# Patient Record
Sex: Male | Born: 1935 | Hispanic: No | Marital: Married | Smoking: Never smoker
Health system: Southern US, Community
[De-identification: ages and names within clinical notes are randomized; demographics above are authoritative.]

## PROBLEM LIST (undated history)

## (undated) DIAGNOSIS — R066 Hiccough: Secondary | ICD-10-CM

## (undated) DIAGNOSIS — K2210 Ulcer of esophagus without bleeding: Secondary | ICD-10-CM

## (undated) DIAGNOSIS — H269 Unspecified cataract: Secondary | ICD-10-CM

## (undated) DIAGNOSIS — K2970 Gastritis, unspecified, without bleeding: Secondary | ICD-10-CM

## (undated) DIAGNOSIS — I639 Cerebral infarction, unspecified: Secondary | ICD-10-CM

## (undated) DIAGNOSIS — K449 Diaphragmatic hernia without obstruction or gangrene: Secondary | ICD-10-CM

## (undated) DIAGNOSIS — K922 Gastrointestinal hemorrhage, unspecified: Secondary | ICD-10-CM

## (undated) DIAGNOSIS — K2100 Gastro-esophageal reflux disease with esophagitis, without bleeding: Secondary | ICD-10-CM

## (undated) DIAGNOSIS — E785 Hyperlipidemia, unspecified: Secondary | ICD-10-CM

## (undated) DIAGNOSIS — D649 Anemia, unspecified: Secondary | ICD-10-CM

## (undated) HISTORY — PX: UPPER GASTROINTESTINAL ENDOSCOPY: SHX188

## (undated) HISTORY — DX: Gastro-esophageal reflux disease with esophagitis, without bleeding: K21.00

## (undated) HISTORY — DX: Gastro-esophageal reflux disease with esophagitis: K21.0

## (undated) HISTORY — PX: COLONOSCOPY: SHX174

## (undated) HISTORY — DX: Ulcer of esophagus without bleeding: K22.10

## (undated) HISTORY — DX: Unspecified cataract: H26.9

## (undated) HISTORY — DX: Hyperlipidemia, unspecified: E78.5

## (undated) HISTORY — DX: Hiccough: R06.6

## (undated) HISTORY — DX: Gastrointestinal hemorrhage, unspecified: K92.2

## (undated) HISTORY — PX: ESOPHAGOGASTRODUODENOSCOPY: SHX1529

---

## 2006-03-01 ENCOUNTER — Ambulatory Visit: Payer: Self-pay | Admitting: Internal Medicine

## 2006-03-03 ENCOUNTER — Ambulatory Visit: Payer: Self-pay | Admitting: Internal Medicine

## 2006-03-07 ENCOUNTER — Ambulatory Visit: Payer: Self-pay | Admitting: *Deleted

## 2006-03-16 ENCOUNTER — Ambulatory Visit (HOSPITAL_COMMUNITY): Admission: RE | Admit: 2006-03-16 | Discharge: 2006-03-16 | Payer: Self-pay | Admitting: Internal Medicine

## 2006-03-29 ENCOUNTER — Ambulatory Visit: Payer: Self-pay | Admitting: Internal Medicine

## 2006-07-07 ENCOUNTER — Ambulatory Visit (HOSPITAL_COMMUNITY): Admission: RE | Admit: 2006-07-07 | Discharge: 2006-07-07 | Payer: Self-pay | Admitting: Internal Medicine

## 2006-07-07 ENCOUNTER — Ambulatory Visit: Payer: Self-pay | Admitting: Internal Medicine

## 2006-07-07 LAB — CONVERTED CEMR LAB
ALT: 15 units/L (ref 0–40)
Albumin: 3.7 g/dL (ref 3.5–5.2)
Alkaline Phosphatase: 89 units/L (ref 39–117)
Creatinine, Ser: 1.5 mg/dL (ref 0.4–1.5)
Ferritin: 7 ng/mL — ABNORMAL LOW (ref 22.0–322.0)
Folate: 8.9 ng/mL
HCT: 39.5 % (ref 39.0–52.0)
Hemoglobin: 12.9 g/dL — ABNORMAL LOW (ref 13.0–17.0)
Lymphocytes Relative: 19.6 % (ref 12.0–46.0)
MCV: 74.5 fL — ABNORMAL LOW (ref 78.0–100.0)
Monocytes Absolute: 0.4 10*3/uL (ref 0.2–0.7)
Potassium: 4.1 meq/L (ref 3.5–5.1)
RBC: 5.29 M/uL (ref 4.22–5.81)
Saturation Ratios: 11.6 % — ABNORMAL LOW (ref 20.0–50.0)
Sodium: 137 meq/L (ref 135–145)
TSH: 1.23 microintl units/mL (ref 0.35–5.50)
Vitamin B-12: 267 pg/mL (ref 211–911)
WBC: 6.4 10*3/uL (ref 4.5–10.5)

## 2006-07-20 ENCOUNTER — Ambulatory Visit: Payer: Self-pay | Admitting: Internal Medicine

## 2006-07-27 ENCOUNTER — Ambulatory Visit: Payer: Self-pay | Admitting: Internal Medicine

## 2006-08-12 ENCOUNTER — Ambulatory Visit: Payer: Self-pay | Admitting: Internal Medicine

## 2014-11-06 ENCOUNTER — Ambulatory Visit: Payer: Self-pay | Admitting: Physical Therapy

## 2016-10-25 ENCOUNTER — Encounter: Payer: Self-pay | Admitting: Internal Medicine

## 2016-11-26 ENCOUNTER — Encounter (HOSPITAL_COMMUNITY): Payer: Self-pay | Admitting: Nurse Practitioner

## 2016-11-26 ENCOUNTER — Emergency Department (HOSPITAL_COMMUNITY): Payer: Medicare Other

## 2016-11-26 ENCOUNTER — Emergency Department (HOSPITAL_COMMUNITY)
Admission: EM | Admit: 2016-11-26 | Discharge: 2016-11-26 | Disposition: A | Discharge status: Home / Self Care | Payer: Medicare Other | Attending: Emergency Medicine | Admitting: Emergency Medicine

## 2016-11-26 DIAGNOSIS — K2900 Acute gastritis without bleeding: Secondary | ICD-10-CM | POA: Insufficient documentation

## 2016-11-26 DIAGNOSIS — R066 Hiccough: Secondary | ICD-10-CM | POA: Insufficient documentation

## 2016-11-26 DIAGNOSIS — Z7902 Long term (current) use of antithrombotics/antiplatelets: Secondary | ICD-10-CM | POA: Diagnosis not present

## 2016-11-26 DIAGNOSIS — Z79899 Other long term (current) drug therapy: Secondary | ICD-10-CM | POA: Insufficient documentation

## 2016-11-26 DIAGNOSIS — R1110 Vomiting, unspecified: Secondary | ICD-10-CM | POA: Diagnosis present

## 2016-11-26 HISTORY — DX: Diaphragmatic hernia without obstruction or gangrene: K44.9

## 2016-11-26 HISTORY — DX: Gastritis, unspecified, without bleeding: K29.70

## 2016-11-26 HISTORY — DX: Anemia, unspecified: D64.9

## 2016-11-26 HISTORY — DX: Cerebral infarction, unspecified: I63.9

## 2016-11-26 LAB — URINALYSIS, ROUTINE W REFLEX MICROSCOPIC
BILIRUBIN URINE: NEGATIVE
GLUCOSE, UA: NEGATIVE mg/dL
HGB URINE DIPSTICK: NEGATIVE
Ketones, ur: NEGATIVE mg/dL
NITRITE: NEGATIVE
PROTEIN: NEGATIVE mg/dL
Specific Gravity, Urine: 1.017 (ref 1.005–1.030)
pH: 6 (ref 5.0–8.0)

## 2016-11-26 LAB — CBC WITH DIFFERENTIAL/PLATELET
BASOS ABS: 0 10*3/uL (ref 0.0–0.1)
Basophils Relative: 0 %
Eosinophils Absolute: 0.3 10*3/uL (ref 0.0–0.7)
Eosinophils Relative: 4 %
HEMATOCRIT: 38.6 % — AB (ref 39.0–52.0)
HEMOGLOBIN: 12.5 g/dL — AB (ref 13.0–17.0)
LYMPHS ABS: 1.9 10*3/uL (ref 0.7–4.0)
LYMPHS PCT: 28 %
MCH: 25.9 pg — AB (ref 26.0–34.0)
MCHC: 32.4 g/dL (ref 30.0–36.0)
MCV: 80.1 fL (ref 78.0–100.0)
Monocytes Absolute: 0.4 10*3/uL (ref 0.1–1.0)
Monocytes Relative: 5 %
NEUTROS ABS: 4.1 10*3/uL (ref 1.7–7.7)
NEUTROS PCT: 63 %
Platelets: 233 10*3/uL (ref 150–400)
RBC: 4.82 MIL/uL (ref 4.22–5.81)
RDW: 14.1 % (ref 11.5–15.5)
WBC: 6.7 10*3/uL (ref 4.0–10.5)

## 2016-11-26 LAB — COMPREHENSIVE METABOLIC PANEL
ALT: 10 U/L — ABNORMAL LOW (ref 17–63)
ANION GAP: 7 (ref 5–15)
AST: 15 U/L (ref 15–41)
Albumin: 3.7 g/dL (ref 3.5–5.0)
Alkaline Phosphatase: 79 U/L (ref 38–126)
BUN: 28 mg/dL — AB (ref 6–20)
CHLORIDE: 107 mmol/L (ref 101–111)
CO2: 23 mmol/L (ref 22–32)
Calcium: 8.3 mg/dL — ABNORMAL LOW (ref 8.9–10.3)
Creatinine, Ser: 1.38 mg/dL — ABNORMAL HIGH (ref 0.61–1.24)
GFR calc Af Amer: 55 mL/min — ABNORMAL LOW (ref >60)
GFR, EST NON AFRICAN AMERICAN: 47 mL/min — AB (ref >60)
Glucose, Bld: 108 mg/dL — ABNORMAL HIGH (ref 65–99)
POTASSIUM: 4 mmol/L (ref 3.5–5.1)
Sodium: 137 mmol/L (ref 135–145)
TOTAL PROTEIN: 7.1 g/dL (ref 6.5–8.1)
Total Bilirubin: 0.6 mg/dL (ref 0.3–1.2)

## 2016-11-26 LAB — I-STAT CHEM 8, ED
BUN: 31 mg/dL — AB (ref 6–20)
CALCIUM ION: 1.09 mmol/L — AB (ref 1.15–1.40)
CHLORIDE: 103 mmol/L (ref 101–111)
Creatinine, Ser: 1.5 mg/dL — ABNORMAL HIGH (ref 0.61–1.24)
GLUCOSE: 109 mg/dL — AB (ref 65–99)
HCT: 39 % (ref 39.0–52.0)
Hemoglobin: 13.3 g/dL (ref 13.0–17.0)
Potassium: 3.9 mmol/L (ref 3.5–5.1)
SODIUM: 138 mmol/L (ref 135–145)
TCO2: 23 mmol/L (ref 0–100)

## 2016-11-26 LAB — LIPASE, BLOOD: Lipase: 29 U/L (ref 11–51)

## 2016-11-26 MED ORDER — CHLORPROMAZINE HCL 25 MG PO TABS: 25.0000 mg | ORAL_TABLET | Freq: Three times a day (TID) | ORAL | 0 refills | Status: DC

## 2016-11-26 MED ORDER — IOPAMIDOL (ISOVUE-300) INJECTION 61%
100.0000 mL | Freq: Once | INTRAVENOUS | Status: AC | PRN
  Administered 2016-11-26: 80 mL via INTRAVENOUS

## 2016-11-26 MED ORDER — IOPAMIDOL (ISOVUE-300) INJECTION 61%
INTRAVENOUS | Status: AC
  Administered 2016-11-26: 16:00:00
  Filled 2016-11-26: qty 100

## 2016-11-26 MED ORDER — PANTOPRAZOLE SODIUM 40 MG IV SOLR
40.0000 mg | Freq: Once | INTRAVENOUS | Status: AC
  Administered 2016-11-26: 40 mg via INTRAVENOUS
  Filled 2016-11-26: qty 40

## 2016-11-26 MED ORDER — SODIUM CHLORIDE 0.9 % IV BOLUS (SEPSIS)
500.0000 mL | Freq: Once | INTRAVENOUS | Status: AC
  Administered 2016-11-26: 500 mL via INTRAVENOUS

## 2016-11-26 MED ORDER — ONDANSETRON 4 MG PO TBDP: ORAL_TABLET | ORAL | 0 refills | Status: DC

## 2016-11-26 MED ORDER — CHLORPROMAZINE HCL 25 MG PO TABS
25.0000 mg | ORAL_TABLET | Freq: Once | ORAL | Status: AC
  Administered 2016-11-26: 25 mg via ORAL
  Filled 2016-11-26: qty 1

## 2016-11-26 NOTE — ED Triage Notes (Signed)
Pt presents to [facility] via [facility] from home accompanied by his wife and son for concerns of nausea and food intolerance for past 6 days. He has grown increasingly weak. He has an appointment with GI on 6/6 but family felt he was unable to wait as his symptoms worsened and he has become increasingly confused. Patient has had constant hiccups, last BM yesterday, and emesis with food.

## 2016-11-26 NOTE — ED Provider Notes (Signed)
[facility] DEPT Provider Note   CSN: [id number] Arrival date & time: 11/26/16  1117     History   Chief Complaint Chief Complaint  Patient presents with  . Dehydration  . Nausea  . Weakness    HPI Logan [name] is a 82 y.o. male.  Pt presents via EMS with upper abdominal pain and vomiting. Family states that he's had a six-day history of nausea and vomiting and hasn't really been no acute anything down. He's having normal bowel movements. There is no blood in his stool or blood in his emesis. He denies any urinary symptoms. He has ongoing pain across his upper abdomen. He has chronic hiccups she's had for about the last 7 years and they feel that the hiccups have been really bothering him. He had an endoscopy in [place] which apparently showed ulcerations in his stomach. He is currently scheduled to see Dr. Carlean Purl with gastroenterology. He seen him once in the past and has an upcoming appointment on June 6.      Past Medical History:  Diagnosis Date  . Anemia   . Gastritis    proximal  . Hiatal hernia   . Stroke [facility])    r side deficits    There are no active problems to display for this patient.   Past Surgical History:  Procedure Laterality Date  . COLONOSCOPY    . ESOPHAGOGASTRODUODENOSCOPY         Home Medications    Prior to Admission medications   Medication Sig Start Date End Date Taking? Authorizing Provider  clopidogrel (PLAVIX) 75 MG tablet Take 75 mg by mouth daily.   Yes [provider]  metoCLOPramide (REGLAN) 10 MG tablet Take 10 mg by mouth 4 (four) times daily.   Yes [provider]  mirtazapine (REMERON) 15 MG tablet Take 15 mg by mouth at bedtime.   Yes [provider]  omeprazole (PRILOSEC) 40 MG capsule Take 40 mg by mouth daily.   Yes [provider]  sucralfate (CARAFATE) 1 g tablet Take 1 g by mouth 4 (four) times daily.   Yes [provider]  Vitamin D, Ergocalciferol, (DRISDOL) 50000 units  CAPS capsule Take 50,000 Units by mouth every 7 (seven) days.   Yes [provider]  chlorproMAZINE (THORAZINE) 25 MG tablet Take 1 tablet (25 mg total) by mouth 3 (three) times daily. 11/26/16   Malvin Johns, MD  ondansetron (ZOFRAN ODT) 4 MG disintegrating tablet 4mg  ODT q4 hours prn nausea/vomit 11/26/16   Malvin Johns, MD    Family History No family history on file.  Social History Social History  Substance Use Topics  . Smoking status: Not on file  . Smokeless tobacco: Not on file  . Alcohol use Not on file     Allergies   Patient has no known allergies.   Review of Systems Review of Systems  Constitutional: Negative for chills, diaphoresis, fatigue and fever.  HENT: Negative for congestion, rhinorrhea and sneezing.   Eyes: Negative.   Respiratory: Negative for cough, chest tightness and shortness of breath.   Cardiovascular: Negative for chest pain and leg swelling.  Gastrointestinal: Positive for abdominal pain, nausea and vomiting. Negative for blood in stool and diarrhea.  Genitourinary: Negative for difficulty urinating, flank pain, frequency and hematuria.  Musculoskeletal: Negative for arthralgias and back pain.  Skin: Negative for rash.  Neurological: Negative for dizziness, speech difficulty, weakness, numbness and headaches.     Physical Exam Updated Vital Signs BP (!) 133/95  Pulse 98   Temp 98.3 F (36.8 C) (Oral)   Resp 14   SpO2 95%   Physical Exam  Constitutional: He is oriented to person, place, and time. He appears well-developed and well-nourished.  HENT:  Head: Normocephalic and atraumatic.  Eyes: Pupils are equal, round, and reactive to light.  Neck: Normal range of motion. Neck supple.  Cardiovascular: Normal rate, regular rhythm and normal heart sounds.   Pulmonary/Chest: Effort normal and breath sounds normal. No respiratory distress. He has no wheezes. He has no rales. He exhibits no tenderness.  Abdominal: Soft. Bowel sounds  are normal. There is tenderness (tenderness across upper abd). There is no rebound and no guarding.  Musculoskeletal: Normal range of motion. He exhibits no edema.  Lymphadenopathy:    He has no cervical adenopathy.  Neurological: He is alert and oriented to person, place, and time.  Skin: Skin is warm and dry. No rash noted.  Psychiatric: He has a normal mood and affect.     ED Treatments / Results  Labs (all labs ordered are listed, but only abnormal results are displayed) Labs Reviewed  COMPREHENSIVE METABOLIC PANEL - Abnormal; Notable for the following:       Result Value   Glucose, Bld 108 (*)    BUN 28 (*)    Creatinine, Ser 1.38 (*)    Calcium 8.3 (*)    ALT 10 (*)    GFR calc non Af Amer 47 (*)    GFR calc Af Amer 55 (*)    All other components within normal limits  CBC WITH DIFFERENTIAL/PLATELET - Abnormal; Notable for the following:    Hemoglobin 12.5 (*)    HCT 38.6 (*)    MCH 25.9 (*)    All other components within normal limits  URINALYSIS, ROUTINE W REFLEX MICROSCOPIC - Abnormal; Notable for the following:    Leukocytes, UA TRACE (*)    Bacteria, UA RARE (*)    Squamous Epithelial / LPF 0-5 (*)    All other components within normal limits  I-STAT CHEM 8, ED - Abnormal; Notable for the following:    BUN 31 (*)    Creatinine, Ser 1.50 (*)    Glucose, Bld 109 (*)    Calcium, Ion 1.09 (*)    All other components within normal limits  LIPASE, BLOOD    EKG  EKG Interpretation None       Radiology Ct Abdomen Pelvis W Contrast  Result Date: 11/26/2016 CLINICAL DATA:  Upper abdominal pain with vomiting. EXAM: CT ABDOMEN AND PELVIS WITH CONTRAST TECHNIQUE: Multidetector CT imaging of the abdomen and pelvis was performed using the standard protocol following bolus administration of intravenous contrast. CONTRAST:  51mL ISOVUE-300 IOPAMIDOL COMPARISON:  03/07/2006 FINDINGS: Lower chest: Thickened appearance of the distal esophagus with appearance submucosal low  density. Prominent although not strictly enlarged periesophageal lymph nodes. Hepatobiliary: No focal liver abnormality.No evidence of biliary obstruction or stone. Pancreas: Unremarkable. Spleen: Unremarkable. Adrenals/Urinary Tract: Negative adrenals. No hydronephrosis or stone. Subcentimeter low-density foci in the bilateral renal cortex is likely cysts, although too small for accurate densitometry. No acute bladder finding. Apex is chronically deviated to the right where there is a treated inguinal hernia. Stomach/Bowel:  No obstruction. No appendicitis. Vascular/Lymphatic: No acute vascular abnormality. No mass or adenopathy. Reproductive: Incidental prostate calcifications. Small varicocele on the left at least. Other: No ascites or pneumoperitoneum.Status post right inguinal hernia repair. Musculoskeletal: Right hip arthroplasty. 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 IMPRESSION: 1. Distal esophageal thickening suggesting esophagitis. 2. No acute intra-abdominal finding. Electronically Signed   By: Monte Fantasia M.D.   On: 11/26/2016 15:56    Procedures Procedures (including critical care time)  Medications Ordered in ED Medications  sodium chloride 0.9 % bolus 500 mL (500 mLs Intravenous New Bag/Given 11/26/16 1557)  pantoprazole (PROTONIX) injection 40 mg (40 mg Intravenous Given 11/26/16 1259)  sodium chloride 0.9 % bolus 500 mL (0 mLs Intravenous Stopped 11/26/16 1359)  chlorproMAZINE (THORAZINE) tablet 25 mg (25 mg Oral Given 11/26/16 1259)  iopamidol (ISOVUE-300) 61 % injection (  Contrast Given 11/26/16 1557)  iopamidol (ISOVUE-300) 61 % injection 100 mL (80 mLs Intravenous Contrast Given 11/26/16 1526)     Initial Impression / Assessment and Plan / ED Course  I have reviewed the triage vital signs and the nursing notes.  Pertinent labs & imaging results that were available during my care of the patient were reviewed  by me and considered in my medical decision making (see chart for details).     Patient presents with upper abdominal pain associated with hiccups and reflux. His hiccups been going on for about 7 years. He's had ongoing reflux issues with a hiatal hernia as well. He's currently on Prilosec and Carafate. He has an upcoming appointment with Dr. Carlean Purl on June 6. CT scan today doesn't reveal any acute abnormalities. His labs are non-concerning. His have resolved with a dose of Thorazine. I spoke with the patient and his son and he does not appear to warrant admission. He was given IV fluids here. His creatinine is similar to his baseline value. He was discharged home in good condition. He is currently being given a PO trial and will be discharged assuming that he has no issues with this. He was encouraged to keep his appointment on June 6. Return precautions were given. He was given a prescription for Thorazine, 7 day course and Zofran.  Final Clinical Impressions(s) / ED Diagnoses   Final diagnoses:  Hiccups  Acute superficial gastritis without hemorrhage    New Prescriptions New Prescriptions   CHLORPROMAZINE (THORAZINE) 25 MG TABLET    Take 1 tablet (25 mg total) by mouth 3 (three) times daily.   ONDANSETRON (ZOFRAN ODT) 4 MG DISINTEGRATING TABLET    4mg  ODT q4 hours prn nausea/vomit     Malvin Johns, MD 11/26/16 1622

## 2016-11-26 NOTE — ED Notes (Signed)
Made first request for a urine sample

## 2016-11-26 NOTE — ED Notes (Signed)
Patient transported to CT 

## 2016-11-26 NOTE — ED Notes (Signed)
Bed: WA21 Expected date:  Expected time:  Means of arrival: Ambulance Comments: EMS dehydration

## 2016-12-01 ENCOUNTER — Encounter: Payer: Self-pay | Admitting: Internal Medicine

## 2016-12-01 ENCOUNTER — Ambulatory Visit (INDEPENDENT_AMBULATORY_CARE_PROVIDER_SITE_OTHER): Payer: Medicare Other | Admitting: Internal Medicine

## 2016-12-01 VITALS — BP 106/82 | HR 76 | Ht 66.0 in | Wt 143.0 lb

## 2016-12-01 DIAGNOSIS — Z7902 Long term (current) use of antithrombotics/antiplatelets: Secondary | ICD-10-CM

## 2016-12-01 DIAGNOSIS — R066 Hiccough: Secondary | ICD-10-CM | POA: Diagnosis not present

## 2016-12-01 DIAGNOSIS — D649 Anemia, unspecified: Secondary | ICD-10-CM

## 2016-12-01 DIAGNOSIS — R933 Abnormal findings on diagnostic imaging of other parts of digestive tract: Secondary | ICD-10-CM

## 2016-12-01 MED ORDER — CHLORPROMAZINE HCL 25 MG PO TABS: 25.0000 mg | ORAL_TABLET | Freq: Three times a day (TID) | ORAL | 1 refills | Status: DC | PRN

## 2016-12-01 NOTE — Progress Notes (Signed)
Logan [name] 82 y.o. Aug 01, 1934 [id number]  Assessment & Plan:   Encounter Diagnoses  Name Primary?  . Hiccoughs Yes  . Abnormal CT scan, esophagus   . Mild anemia   . Long term current use of antithrombotics/antiplatelets     The patient's hiccups are improved on Thorazine. He has been hit cup free for several days or a week so we can stop that and use those again as needed. I will refill thorazine  He has a thickened esophagus on CT scan. This is probably esophagitis or spasm or his small hiatal hernia. Given everything that has been going on there some mild weight loss she has a mild anemia I think an upper endoscopy is reasonable. We'll evaluate to make sure there is no significant structural abnormality. He does not seem to have dysphagia. He did have some transient oropharyngeal type dysphagia after his stroke but that apparently is gone.  The risks and benefits as well as alternatives of endoscopic procedure(s) have been discussed and reviewed. All questions answered. The patient agrees to proceed. Higher risk of bleeding on clopidogrel but would not stop given recent stroke.  We'll request records from the hospital he was at a [place]. It sounds like he was recommended to have a fundoplication or some sort of surgery repair is hiatal hernia itch. She cups but that would not be appropriate based on upon what I know. I am glad he did not have that done.  I appreciate the opportunity to care for you. Logan Mayer, MD, Logan [name]  Subjective:   Chief Complaint: Multiple, mainly hiccups and vomiting.  HPI Patient is here with his son and wife, we used an interpreter and also spoke to his other son in [place] during the interview. I had seen him remotely, and performed colonoscopy and upper endoscopy in 2007 and 2008. At that time he was complaining of recurrent vomiting and had been treated with domperidone and PPI without relief. Small hiatal hernia. Thickened gastric  folds. He had chronic active gastritis with H. pylori which was treated. Colonoscopy for iron deficiency anemia was negative. He was also tried on baclofen for the hiccups then. Moving forward to the present day he had a small stroke sometime the past few months when he was in [place] with his other son, he had mild dysphagia and dysarthria or aphasia deficits that improved significantly per the family. He spent a month and a rehabilitation center. He is now down here he had a lot of trouble with hiccups again went to the emergency room where Thorazine was prescribed and seemed to break the cycle. He has not had hiccups in about a week. Because they had a recommendation about surgery for the hiatal hernia to stop the hiccups they presented here. In the emergency room a CT scan was done which demonstrated a distal thickening of the esophagus. Images viewed. Otherwise negative. His labs showed a mild anemia which was similar to what he had in the past with a hemoglobin of 12.5 and a normal but slightly low MCV. This is on June 1. Creatinine slightly elevated at 1.5 and 1.38.  Mild weight loss is reported.  Patient also has reported some melena. And constipation issues.  No Known Allergies Current Meds  Medication Sig  . chlorproMAZINE (THORAZINE) 25 MG tablet Take 1 tablet (25 mg total) by mouth 3 (three) times daily as needed.  . clopidogrel (PLAVIX) 75 MG tablet Take 75 mg by mouth daily.  Logan [name]  metoCLOPramide (REGLAN) 10 MG tablet Take 10 mg by mouth 4 (four) times daily.  . mirtazapine (REMERON) 15 MG tablet Take 15 mg by mouth at bedtime.  Logan [name] omeprazole (PRILOSEC) 40 MG capsule Take 40 mg by mouth daily.  . ondansetron (ZOFRAN ODT) 4 MG disintegrating tablet 4mg  ODT q4 hours prn nausea/vomit  . sucralfate (CARAFATE) 1 g tablet Take 1 g by mouth 4 (four) times daily.  . Vitamin D, Ergocalciferol, (DRISDOL) 50000 units CAPS capsule Take 50,000 Units by mouth every 7 (seven) days.  . [DISCONTINUED]  chlorproMAZINE (THORAZINE) 25 MG tablet Take 1 tablet (25 mg total) by mouth 3 (three) times daily.   Past Medical History:  Diagnosis Date  . Anemia   . Gastritis    proximal  . Hiatal hernia   . Stroke [facility])    r side deficits   Past Surgical History:  Procedure Laterality Date  . COLONOSCOPY    . ESOPHAGOGASTRODUODENOSCOPY     Social History   Social History  . Marital status: Married    Spouse name: N/A  . Number of children: N/A  . Years of education: N/A   Occupational History  . retired    Social History Main Topics  . Smoking status: Never Smoker  . Smokeless tobacco: Never Used  . Alcohol use No  . Drug use: Unknown  . Sexual activity: Not Asked   Other Topics Concern  . None   Social History Narrative   Married 2 sons (at least)   Spends time with them in [place] and [place] Hx non-contributory - sons are healthy  Review of Systems He now uses  a walker. Slow down some. Frustrated and anxious over the hiccups problems. Allother review of systems negative or as per history of present illness.  Objective:   Physical Exam @BP  106/82   Pulse 76   Ht 5\' 6"  (1.676 m)   Wt 143 lb (64.9 kg)   BMI 23.08 kg/m @  General:  Well-developed, well-nourished and in no acute distress Eyes:  anicteric. ENT:    Rhinophyma changes Neck:   supple w/o thyromegaly or mass.  Lungs: Clear to auscultation bilaterally. Heart:  S1S2, no rubs, murmurs, gallops. Abdomen:  soft, non-tender, no hepatosplenomegaly, hernia, or mass and BS+.  Rectal: Brown heme neg stool Lymph:  no cervical or supraclavicular adenopathy. Extremities:   no edema, cyanosis or clubbing Skin   no rash. Neuro:  A&O x 3.  Psych:  appropriate mood and  Affect.   Data Reviewed:   CT scan, images, hospital notes from 6/1 ED visit and labs and prior remote GI records

## 2016-12-01 NOTE — Patient Instructions (Signed)
   You have been scheduled for an endoscopy. Please follow written instructions given to you at your visit today. If you use inhalers (even only as needed), please bring them with you on the day of your procedure.   Stop the thorazine and just use it as needed.    I appreciate the opportunity to care for you. Silvano Rusk, MD, [facility]

## 2016-12-02 ENCOUNTER — Encounter: Payer: Self-pay | Admitting: Internal Medicine

## 2016-12-02 ENCOUNTER — Ambulatory Visit (INDEPENDENT_AMBULATORY_CARE_PROVIDER_SITE_OTHER): Payer: Medicare Other | Admitting: Internal Medicine

## 2016-12-02 VITALS — BP 144/93 | HR 77 | Temp 97.3°F | Resp 19 | Ht 66.0 in | Wt 143.0 lb

## 2016-12-02 DIAGNOSIS — R933 Abnormal findings on diagnostic imaging of other parts of digestive tract: Secondary | ICD-10-CM | POA: Diagnosis present

## 2016-12-02 MED ORDER — SODIUM CHLORIDE 0.9 % IV SOLN
500.0000 mL | INTRAVENOUS | Status: DC
Start: 2016-12-02 — End: 2017-03-06

## 2016-12-02 NOTE — Progress Notes (Signed)
Called to room to assist during endoscopic procedure.  Patient ID and intended procedure confirmed with present staff. Received instructions for my participation in the procedure from the performing physician.  

## 2016-12-02 NOTE — Op Note (Signed)
[facility] Patient Name: Logan [name] Procedure Date: 12/02/2016 8:27 AM MRN: [id number] Endoscopist: [name] , MD Age: 82 Referring MD:  Date of Birth: Apr 18, 1935 Gender: Male Account #: [id number] Procedure:                Upper GI endoscopy Indications:              Abnormal CT of the GI tract, Recurrent hiccups Medicines:                Propofol per Anesthesia, Monitored Anesthesia Care Procedure:                Pre-Anesthesia Assessment:                           - Prior to the procedure, a History and Physical                            was performed, and patient medications and                            allergies were reviewed. The patient's tolerance of                            previous anesthesia was also reviewed. The risks                            and benefits of the procedure and the sedation                            options and risks were discussed with the patient.                            All questions were answered, and informed consent                            was obtained. Prior Anticoagulants: The patient                            last took Plavix (clopidogrel) on the day of the                            procedure. ASA Grade Assessment: II - A patient                            with mild systemic disease. After reviewing the                            risks and benefits, the patient was deemed in                            satisfactory condition to undergo the procedure.                           After obtaining informed consent, the endoscope was  passed under direct vision. Throughout the                            procedure, the patient's blood pressure, pulse, and                            oxygen saturations were monitored continuously. The                            Model GIF-[id number]) scope was introduced                            through the mouth, and advanced to the second part                  of duodenum. The upper GI endoscopy was                            accomplished without difficulty. The patient                            tolerated the procedure well. Scope In: Scope Out: Findings:                 LA Grade A (one or more mucosal breaks less than 5                            mm, not extending between tops of 2 mucosal folds)                            esophagitis with no bleeding was found in the                            distal esophagus.                           A 6 cm hiatal hernia was present.                           The exam was otherwise without abnormality.                           The cardia and gastric fundus were normal on                            retroflexion.                           Biopsies were taken with a cold forceps in the                            gastric antrum for Helicobacter pylori testing                            using CLOtest. Estimated blood loss was minimal. Complications:  No immediate complications. Estimated Blood Loss:     Estimated blood loss was minimal. Impression:               - LA Grade A esophagitis.                           - 6 cm hiatal hernia.                           - The examination was otherwise normal.                           - Biopsies were taken with a cold forceps for                            Helicobacter pylori testing using CLOtest. Recommendation:           - Patient has a contact number available for                            emergencies. The signs and symptoms of potential                            delayed complications were discussed with the                            patient. Return to normal activities tomorrow.                            Written discharge instructions were provided to the                            patient.                           - Resume previous diet.                           - Continue present medications.                           - Await  pathology results.                           - Use thorazine prn hiccups                           await CLO test - hx H. pylori - want to see if                            eradicated                           Do not recommend any surgery (seems like [place] MD was                            recommending hiatal hernia  repair for refractory                            hiccups) - await records review for clarification                           Will call results [name], MD 12/02/2016 8:44:38 AM This report has been signed electronically.

## 2016-12-02 NOTE — Progress Notes (Signed)
Interpreter used today at the [facility] for this pt.  Interpreter's name is- son Juanmanuel Marohl

## 2016-12-02 NOTE — Patient Instructions (Addendum)
There was a hiatal hernia like we know and slight inflammation where the esophagus and stomach meet. That was probably due to the hiccups and vomiting and no a cause.  I took biopsies from the stomach to test for infection - you had one years ago and I want to make sure it is gone.  I do not think you need any surgery.  I will call with results next week.  I hope the hiccups stay away.  I appreciate the opportunity to care for you. [name], MD, FACG   YOU HAD AN ENDOSCOPIC PROCEDURE TODAY AT [facility]:   Refer to the procedure report that was given to you for any specific questions about what was found during the examination.  If the procedure report does not answer your questions, please call your gastroenterologist to clarify.  If you requested that your care partner not be given the details of your procedure findings, then the procedure report has been included in a sealed envelope for you to review at your convenience later.  YOU SHOULD EXPECT: Some feelings of bloating in the abdomen. Passage of more gas than usual.  Walking can help get rid of the air that was put into your GI tract during the procedure and reduce the bloating. If you had a lower endoscopy (such as a colonoscopy or flexible sigmoidoscopy) you may notice spotting of blood in your stool or on the toilet paper. If you underwent a bowel prep for your procedure, you may not have a normal bowel movement for a few days.  Please Note:  You might notice some irritation and congestion in your nose or some drainage.  This is from the oxygen used during your procedure.  There is no need for concern and it should clear up in a day or so.  SYMPTOMS TO REPORT IMMEDIATELY:    Following upper endoscopy (EGD)  Vomiting of blood or coffee ground material  New chest pain or pain under the shoulder blades  Painful or persistently difficult swallowing  New shortness of breath  Fever of 100F or  higher  Black, tarry-looking stools  Please read all handouts given to you by your recovery nurse.  For urgent or emergent issues, a gastroenterologist can be reached at any hour by calling [phone].   DIET:  We do recommend a small meal at first, but then you may proceed to your regular diet.  Drink plenty of fluids but you should avoid alcoholic beverages for 24 hours.  ACTIVITY:  You should plan to take it easy for the rest of today and you should NOT DRIVE or use heavy machinery until tomorrow (because of the sedation medicines used during the test).    FOLLOW UP: Our staff will call the number listed on your records the next business day following your procedure to check on you and address any questions or concerns that you may have regarding the information given to you following your procedure. If we do not reach you, we will leave a message.  However, if you are feeling well and you are not experiencing any problems, there is no need to return our call.  We will assume that you have returned to your regular daily activities without incident.  If any biopsies were taken you will be contacted by phone or by letter within the next 1-3 weeks.  Please call us at [phone] if you have not heard about the biopsies in 3 weeks.  SIGNATURES/CONFIDENTIALITY: You and/or your care partner have signed paperwork which will be entered into your electronic medical record.  These signatures attest to the fact that that the information above on your After Visit Summary has been reviewed and is understood.  Full responsibility of the confidentiality of this discharge information lies with you and/or your care-partner.  Thank you for letting us take care of your healthcare needs today.

## 2016-12-02 NOTE — Progress Notes (Signed)
Dental advisory given to patient 

## 2016-12-03 ENCOUNTER — Telehealth: Payer: Self-pay

## 2016-12-03 LAB — HELICOBACTER PYLORI SCREEN-BIOPSY: UREASE: NEGATIVE

## 2016-12-03 NOTE — Telephone Encounter (Signed)
  Follow up Call-  Call back number 12/02/2016  Post procedure Call Back phone  # [phone]- son's phone  Permission to leave phone message Yes  Some recent data might be hidden     Patient questions:  Do you have a fever, pain , or abdominal swelling? No. Pain Score  0 *  Have you tolerated food without any problems? Yes.    Have you been able to return to your normal activities? Yes.    Do you have any questions about your discharge instructions: Diet   No. Medications  No. Follow up visit  No.  Do you have questions or concerns about your Care? No.  Actions: * If pain score is 4 or above: No action needed, pain <4.

## 2016-12-08 NOTE — Progress Notes (Signed)
Please let family know no infection Hope he is doing ok F/u me prn - if they feel need to set up f/u go ahead for aug

## 2017-03-03 ENCOUNTER — Encounter (HOSPITAL_COMMUNITY): Payer: Self-pay | Admitting: Emergency Medicine

## 2017-03-03 ENCOUNTER — Inpatient Hospital Stay (HOSPITAL_COMMUNITY): Payer: Medicare Other

## 2017-03-03 ENCOUNTER — Emergency Department (HOSPITAL_COMMUNITY): Payer: Medicare Other

## 2017-03-03 ENCOUNTER — Inpatient Hospital Stay (HOSPITAL_COMMUNITY)
Admission: EM | Admit: 2017-03-03 | Discharge: 2017-03-06 | DRG: 381 | Disposition: A | Discharge status: Home / Self Care | Payer: Medicare Other | Attending: Internal Medicine | Admitting: Internal Medicine

## 2017-03-03 DIAGNOSIS — K219 Gastro-esophageal reflux disease without esophagitis: Secondary | ICD-10-CM | POA: Diagnosis present

## 2017-03-03 DIAGNOSIS — K2211 Ulcer of esophagus with bleeding: Secondary | ICD-10-CM | POA: Diagnosis present

## 2017-03-03 DIAGNOSIS — I471 Supraventricular tachycardia, unspecified: Secondary | ICD-10-CM

## 2017-03-03 DIAGNOSIS — I5032 Chronic diastolic (congestive) heart failure: Secondary | ICD-10-CM | POA: Diagnosis present

## 2017-03-03 DIAGNOSIS — D509 Iron deficiency anemia, unspecified: Secondary | ICD-10-CM | POA: Diagnosis present

## 2017-03-03 DIAGNOSIS — D72829 Elevated white blood cell count, unspecified: Secondary | ICD-10-CM | POA: Diagnosis present

## 2017-03-03 DIAGNOSIS — Z79899 Other long term (current) drug therapy: Secondary | ICD-10-CM | POA: Diagnosis not present

## 2017-03-03 DIAGNOSIS — K2970 Gastritis, unspecified, without bleeding: Secondary | ICD-10-CM | POA: Diagnosis not present

## 2017-03-03 DIAGNOSIS — Z9889 Other specified postprocedural states: Secondary | ICD-10-CM | POA: Diagnosis not present

## 2017-03-03 DIAGNOSIS — K2950 Unspecified chronic gastritis without bleeding: Secondary | ICD-10-CM | POA: Diagnosis present

## 2017-03-03 DIAGNOSIS — B9681 Helicobacter pylori [H. pylori] as the cause of diseases classified elsewhere: Secondary | ICD-10-CM

## 2017-03-03 DIAGNOSIS — I69351 Hemiplegia and hemiparesis following cerebral infarction affecting right dominant side: Secondary | ICD-10-CM | POA: Diagnosis not present

## 2017-03-03 DIAGNOSIS — K259 Gastric ulcer, unspecified as acute or chronic, without hemorrhage or perforation: Secondary | ICD-10-CM | POA: Diagnosis present

## 2017-03-03 DIAGNOSIS — K449 Diaphragmatic hernia without obstruction or gangrene: Secondary | ICD-10-CM | POA: Diagnosis present

## 2017-03-03 DIAGNOSIS — K920 Hematemesis: Secondary | ICD-10-CM | POA: Diagnosis present

## 2017-03-03 DIAGNOSIS — I517 Cardiomegaly: Secondary | ICD-10-CM

## 2017-03-03 DIAGNOSIS — I639 Cerebral infarction, unspecified: Secondary | ICD-10-CM

## 2017-03-03 DIAGNOSIS — N179 Acute kidney failure, unspecified: Secondary | ICD-10-CM

## 2017-03-03 DIAGNOSIS — R066 Hiccough: Secondary | ICD-10-CM | POA: Diagnosis present

## 2017-03-03 DIAGNOSIS — R739 Hyperglycemia, unspecified: Secondary | ICD-10-CM | POA: Diagnosis present

## 2017-03-03 DIAGNOSIS — K922 Gastrointestinal hemorrhage, unspecified: Secondary | ICD-10-CM | POA: Diagnosis not present

## 2017-03-03 DIAGNOSIS — N183 Chronic kidney disease, stage 3 (moderate): Secondary | ICD-10-CM | POA: Diagnosis present

## 2017-03-03 LAB — I-STAT CHEM 8, ED
BUN: 41 mg/dL — ABNORMAL HIGH (ref 6–20)
Calcium, Ion: 1.22 mmol/L (ref 1.15–1.40)
Chloride: 100 mmol/L — ABNORMAL LOW (ref 101–111)
Creatinine, Ser: 1.6 mg/dL — ABNORMAL HIGH (ref 0.61–1.24)
GLUCOSE: 275 mg/dL — AB (ref 65–99)
HCT: 35 % — ABNORMAL LOW (ref 39.0–52.0)
HEMOGLOBIN: 11.9 g/dL — AB (ref 13.0–17.0)
POTASSIUM: 4.2 mmol/L (ref 3.5–5.1)
SODIUM: 136 mmol/L (ref 135–145)
TCO2: 22 mmol/L (ref 22–32)

## 2017-03-03 LAB — COMPREHENSIVE METABOLIC PANEL
ALBUMIN: 3.8 g/dL (ref 3.5–5.0)
ALK PHOS: 83 U/L (ref 38–126)
ALT: 12 U/L — AB (ref 17–63)
AST: 22 U/L (ref 15–41)
Anion gap: 14 (ref 5–15)
BILIRUBIN TOTAL: 0.5 mg/dL (ref 0.3–1.2)
BUN: 43 mg/dL — AB (ref 6–20)
CALCIUM: 9.9 mg/dL (ref 8.9–10.3)
CO2: 20 mmol/L — ABNORMAL LOW (ref 22–32)
Chloride: 100 mmol/L — ABNORMAL LOW (ref 101–111)
Creatinine, Ser: 1.68 mg/dL — ABNORMAL HIGH (ref 0.61–1.24)
GFR calc Af Amer: 43 mL/min — ABNORMAL LOW (ref >60)
GFR calc non Af Amer: 37 mL/min — ABNORMAL LOW (ref >60)
GLUCOSE: 278 mg/dL — AB (ref 65–99)
Potassium: 4.3 mmol/L (ref 3.5–5.1)
Sodium: 134 mmol/L — ABNORMAL LOW (ref 135–145)
TOTAL PROTEIN: 7.4 g/dL (ref 6.5–8.1)

## 2017-03-03 LAB — ABO/RH: ABO/RH(D): B POS

## 2017-03-03 LAB — I-STAT TROPONIN, ED: TROPONIN I, POC: 0 ng/mL (ref 0.00–0.08)

## 2017-03-03 LAB — CBC
HCT: 33.3 % — ABNORMAL LOW (ref 39.0–52.0)
Hemoglobin: 10 g/dL — ABNORMAL LOW (ref 13.0–17.0)
MCH: 21.7 pg — AB (ref 26.0–34.0)
MCHC: 30 g/dL (ref 30.0–36.0)
MCV: 72.4 fL — ABNORMAL LOW (ref 78.0–100.0)
Platelets: 401 10*3/uL — ABNORMAL HIGH (ref 150–400)
RBC: 4.6 MIL/uL (ref 4.22–5.81)
RDW: 15.5 % (ref 11.5–15.5)
WBC: 17.2 10*3/uL — ABNORMAL HIGH (ref 4.0–10.5)

## 2017-03-03 LAB — CBG MONITORING, ED: GLUCOSE-CAPILLARY: 290 mg/dL — AB (ref 65–99)

## 2017-03-03 LAB — LIPASE, BLOOD: LIPASE: 29 U/L (ref 11–51)

## 2017-03-03 MED ORDER — SODIUM CHLORIDE 0.9 % IV BOLUS (SEPSIS)
1000.0000 mL | Freq: Once | INTRAVENOUS | Status: AC
  Administered 2017-03-03: 1000 mL via INTRAVENOUS

## 2017-03-03 MED ORDER — DEXTROSE-NACL 5-0.9 % IV SOLN
INTRAVENOUS | Status: DC
  Administered 2017-03-04 – 2017-03-06 (×5): via INTRAVENOUS

## 2017-03-03 MED ORDER — PANTOPRAZOLE SODIUM 40 MG IV SOLR: 40.0000 mg | Freq: Two times a day (BID) | INTRAVENOUS | Status: DC

## 2017-03-03 MED ORDER — SODIUM CHLORIDE 0.9 % IV SOLN
8.0000 mg/h | INTRAVENOUS | Status: DC
  Administered 2017-03-03 – 2017-03-04 (×3): 8 mg/h via INTRAVENOUS
  Filled 2017-03-03 (×6): qty 80

## 2017-03-03 MED ORDER — ONDANSETRON HCL 4 MG/2ML IJ SOLN
4.0000 mg | Freq: Once | INTRAMUSCULAR | Status: AC
  Administered 2017-03-03: 4 mg via INTRAVENOUS
  Filled 2017-03-03: qty 2

## 2017-03-03 MED ORDER — ONDANSETRON HCL 4 MG/2ML IJ SOLN
4.0000 mg | Freq: Four times a day (QID) | INTRAMUSCULAR | Status: DC | PRN
  Administered 2017-03-04 – 2017-03-05 (×3): 4 mg via INTRAVENOUS
  Filled 2017-03-03 (×4): qty 2

## 2017-03-03 MED ORDER — PANTOPRAZOLE SODIUM 40 MG IV SOLR
40.0000 mg | Freq: Once | INTRAVENOUS | Status: AC
  Administered 2017-03-03: 40 mg via INTRAVENOUS
  Filled 2017-03-03: qty 40

## 2017-03-03 NOTE — ED Notes (Signed)
Pt transported to ultrasound.

## 2017-03-03 NOTE — ED Provider Notes (Signed)
[facility] DEPT Provider Note   CSN: [id number] Arrival date & time: 03/03/17  1955     History   Chief Complaint Chief Complaint  Patient presents with  . Hematemesis  . Abdominal Pain  . Tachycardia    HPI Logan [name] is a 82 y.o. male.  The history is limited by a language barrier. A language interpreter was used.    40-year-old male with a history of large hiatal hernia and esophagitis presents to the emergency department with 1 day of several bouts of hematemesis. The patient ran out of his Zofran and unable to control his nausea and vomiting. Also endorses nonradiating, nonexertional chest pain. Also endorses intermittent abdominal pain. No recent melena. No alleviating or aggravating factors. No other physical complaints.  Past Medical History:  Diagnosis Date  . Anemia   . Cataract   . Gastritis    proximal  . Hiatal hernia   . Hyperlipidemia   . Stroke [facility])    r side deficits; September 2017    There are no active problems to display for this patient.   Past Surgical History:  Procedure Laterality Date  . COLONOSCOPY    . ESOPHAGOGASTRODUODENOSCOPY    . UPPER GASTROINTESTINAL ENDOSCOPY         Home Medications    Prior to Admission medications   Medication Sig Start Date End Date Taking? Authorizing Provider  chlorproMAZINE (THORAZINE) 25 MG tablet Take 1 tablet (25 mg total) by mouth 3 (three) times daily as needed. 12/01/16   [name], MD  clopidogrel (PLAVIX) 75 MG tablet Take 75 mg by mouth daily.    [provider]  metoCLOPramide (REGLAN) 10 MG tablet Take 10 mg by mouth 4 (four) times daily.    [provider]  mirtazapine (REMERON) 15 MG tablet Take 15 mg by mouth at bedtime.    [provider]  omeprazole (PRILOSEC) 40 MG capsule Take 40 mg by mouth daily.    [provider]  ondansetron (ZOFRAN ODT) 4 MG disintegrating tablet 4mg  ODT q4 hours prn nausea/vomit Patient not taking: Reported on  12/02/2016 11/26/16   Malvin Johns, MD  sucralfate (CARAFATE) 1 g tablet Take 1 g by mouth 4 (four) times daily.    [provider]  Vitamin D, Ergocalciferol, (DRISDOL) 50000 units CAPS capsule Take 50,000 Units by mouth every 7 (seven) days.    [provider]    Family History Family History  Problem Relation Age of Onset  . Colon cancer Neg Hx   . Esophageal cancer Neg Hx   . Rectal cancer Neg Hx     Social History Social History  Substance Use Topics  . Smoking status: Never Smoker  . Smokeless tobacco: Never Used  . Alcohol use No     Allergies   Patient has no known allergies.   Review of Systems Review of Systems All other systems are reviewed and are negative for acute change except as noted in the HPI   Physical Exam Updated Vital Signs BP 137/84   Pulse (!) 161   Temp 99.1 F (37.3 C) (Oral)   Resp 12   Ht 5\' 7"  (4.163 m)   Wt 65.8 kg (145 lb)   SpO2 98%   BMI 22.71 kg/m   Physical Exam  Constitutional: He is oriented to person, place, and time. He appears well-developed and well-nourished. No distress.  HENT:  Head: Normocephalic and atraumatic.  Nose: Nose normal.  Eyes: Pupils are equal, round, and  reactive to light. Conjunctivae and EOM are normal. Right eye exhibits no discharge. Left eye exhibits no discharge. No scleral icterus.  Neck: Normal range of motion. Neck supple.  Cardiovascular: Normal rate and regular rhythm.  Exam reveals no gallop and no friction rub.   No murmur heard. Pulmonary/Chest: Effort normal and breath sounds normal. No stridor. No respiratory distress. He has no rales.  Abdominal: Soft. He exhibits no distension. There is no tenderness. There is no rigidity, no rebound and no guarding.  Musculoskeletal: He exhibits no edema or tenderness.  Neurological: He is alert and oriented to person, place, and time.  Skin: Skin is warm and dry. No rash noted. He is not diaphoretic. No erythema.  Psychiatric: He  has a normal mood and affect.  Vitals reviewed.    ED Treatments / Results  Labs (all labs ordered are listed, but only abnormal results are displayed) Labs Reviewed  CBG MONITORING, ED - Abnormal; Notable for the following:       Result Value   Glucose-Capillary 290 (*)    All other components within normal limits  COMPREHENSIVE METABOLIC PANEL  CBC  LIPASE, BLOOD  URINALYSIS, ROUTINE W REFLEX MICROSCOPIC  POC OCCULT BLOOD, ED  TYPE AND SCREEN    EKG  EKG Interpretation  Date/Time:  Thursday March 03 2017 20:09:26 EDT Ventricular Rate:  161 PR Interval:    QRS Duration: 70 QT Interval:  306 QTC Calculation: 500 R Axis:   47 Text Interpretation:  Sinus tachycardia Otherwise normal ECG Confirmed by Addison Lank ([id number]) on 03/04/2017 12:50:41 AM       Radiology No results found.  Procedures Procedures (including critical care time) CRITICAL CARE Performed by: Grayce Sessions Tobie Perdue Total critical care time: 40 minutes Critical care time was exclusive of separately billable procedures and treating other patients. Critical care was necessary to treat or prevent imminent or life-threatening deterioration. Critical care was time spent personally by me on the following activities: development of treatment plan with patient and/or surrogate as well as nursing, discussions with consultants, evaluation of patient's response to treatment, examination of patient, obtaining history from patient or surrogate, ordering and performing treatments and interventions, ordering and review of laboratory studies, ordering and review of radiographic studies, pulse oximetry and re-evaluation of patient's condition.   Medications Ordered in ED Medications - No data to display   Initial Impression / Assessment and Plan / ED Course  I have reviewed the triage vital signs and the nursing notes.  Pertinent labs & imaging results that were available during my care of the patient were  reviewed by me and considered in my medical decision making (see chart for details).     Coffee-ground hematemesis. No crepitus on exam. Chest x-ray without evidence of pneumomediastinum concerning for esophageal perforation.   eKG was sinus tachycardia with hidden p-wave in T wave (double hump). Initial troponin negative. File his chest but is unlikely related to ACS. Feel that is more related to his hematemesis.   Hemoglobin with 3 g drop since last check 2 months ago. Started on Protonix. Patient also given nausea medication and IV fluids. Tachycardia improved with IV fluids. The patient no longer having bouts of emesis. NGT inserted. Case discussed with GI who would follow along. Admitted to medicine for further management.  Final Clinical Impressions(s) / ED Diagnoses   Final diagnoses:  Hematemesis  Upper GI bleed      Dillian Feig, Grayce Sessions, MD 03/04/17 [phone]

## 2017-03-03 NOTE — H&P (Signed)
History and Physical  Logan [name] RKY:[id number] DOB: May 25, 1935 DOA: 03/03/2017  PCP:  [name], MD   Chief Complaint:  Hematemesis   History of Present Illness:  History taken was challenging due to language barrier and grandson is not fully aware of medical history. Pt said he has been vomiting "black stuff" all day but denied any red blood or hematochezia. He has mild cough but denied hemoptysis. No abdominal pain. No dyspnea/chest pain. No fever/chills. He reported feeling fatigue and tired. No other complaints. He had EGDs before, last one was done in June and showed esophagitis and hiatal hernia but otherwise normal.   Report of EGD form June: - LA Grade A esophagitis. - 6 cm hiatal hernia. - The examination was otherwise normal. - Biopsies were taken with a cold forceps for Helicobacter pylori testing using CLOtest.  Review of Systems:  CONSTITUTIONAL:     No night sweats.  +fatigue.  No fever. No chills. Eyes:                            No visual changes.  No eye pain.  No eye discharge.   ENT:                              No epistaxis.  No sinus pain.  No sore throat.   No congestion. RESPIRATORY:           No cough.  No wheeze.  No hemoptysis.  No dyspnea CARDIOVASCULAR   :  No chest pains.  No palpitations. GASTROINTESTINAL:  No abdominal pain.  +nausea. +vomiting.  No diarrhea. Noconstipation.  +hematemesis.  No hematochezia.  No melena. GENITOURINARY:      No urgency.  No frequency.  No dysuria.  No hematuria.  No      obstructive symptoms.  No discharge.  No pain.   MUSCULOSKELETAL:  No musculoskeletal pain.  No joint swelling.  No arthritis. NEUROLOGICAL:        No confusion.  No weakness. No headache. No seizure. PSYCHIATRIC:             No depression. No anxiety. No suicidal ideation. SKIN:                             No rashes.  No lesions.  No wounds. ENDOCRINE:                No weight loss.  No polydipsia.  No polyuria.  No  polyphagia. HEMATOLOGIC:           No purpura.  No petechiae.  No bleeding.  ALLERGIC                 : No pruritus.  No angioedema Other:  Past Medical and Surgical History:   Past Medical History:  Diagnosis Date  . Anemia   . Cataract   . Gastritis    proximal  . Hiatal hernia   . Hyperlipidemia   . Stroke [facility])    r side deficits; September 2017   Past Surgical History:  Procedure Laterality Date  . COLONOSCOPY    . ESOPHAGOGASTRODUODENOSCOPY    . UPPER GASTROINTESTINAL ENDOSCOPY      Social History:   reports that he has never smoked. He has never used smokeless tobacco. He reports that he does not drink alcohol or  use drugs.   No Known Allergies  Family History  Problem Relation Age of Onset  . Colon cancer Neg Hx   . Esophageal cancer Neg Hx   . Rectal cancer Neg Hx       Prior to Admission medications   Medication Sig Start Date End Date Taking? Authorizing Provider  chlorproMAZINE (THORAZINE) 25 MG tablet Take 1 tablet (25 mg total) by mouth 3 (three) times daily as needed. 12/01/16   [name], MD  clopidogrel (PLAVIX) 75 MG tablet Take 75 mg by mouth daily.    [provider]  metoCLOPramide (REGLAN) 10 MG tablet Take 10 mg by mouth 4 (four) times daily.    [provider]  mirtazapine (REMERON) 15 MG tablet Take 15 mg by mouth at bedtime.    [provider]  omeprazole (PRILOSEC) 40 MG capsule Take 40 mg by mouth daily.    [provider]  ondansetron (ZOFRAN ODT) 4 MG disintegrating tablet 4mg  ODT q4 hours prn nausea/vomit Patient not taking: Reported on 12/02/2016 11/26/16   Malvin Johns, MD  sucralfate (CARAFATE) 1 g tablet Take 1 g by mouth 4 (four) times daily.    [provider]  Vitamin D, Ergocalciferol, (DRISDOL) 50000 units CAPS capsule Take 50,000 Units by mouth every 7 (seven) days.    [provider]    Physical Exam: BP (!) 151/77   Pulse (!) 121   Temp 99.1 F (37.3 C) (Oral)    Resp 14   Ht 5\' 7"  (1.702 m)   Wt 65.8 kg (145 lb)   SpO2 95%   BMI 22.71 kg/m   GENERAL :   Alert and cooperative, and appears to be in no acute distress. HEAD:           normocephalic. EYES:            PERRL, EOMI.   EARS:           hearing grossly intact.   NECK:          supple CARDIAC:    Normal S1 and S2. No gallop. No murmurs.  Vascular:     no peripheral edema.  LUNGS:       Clear to auscultation  ABDOMEN: Positive bowel sounds. Soft, nondistended, nontender. No guarding or rebound.      MSK:           No joint erythema or tenderness.  EXT           : No significant deformity or joint abnormality. Neuro        : Alert, oriented to person, place, and time. SKIN:            No rash. No lesions. PSYCH:       No hallucination. Patient is not suicidal.          Labs on Admission:  Reviewed.   Radiological Exams on Admission: Dg Chest Port 1 View  Result Date: 03/03/2017 CLINICAL DATA:  Hematemesis.  Weakness. EXAM: PORTABLE CHEST 1 VIEW COMPARISON:  Remote radiographs 07/07/2006 FINDINGS: Heart size upper normal. Atherosclerosis and tortuosity of the thoracic aorta. No pulmonary edema. No consolidation, pleural fluid or pneumothorax. Remote left rib fractures. IMPRESSION: 1. No acute chest finding. 2. Tortuous atherosclerotic thoracic aorta. Electronically Signed   By: Jeb Levering M.D.   On: 03/03/2017 21:04    EKG:  Independently reviewed. SVT  Assessment/Plan  Upper GI bleeding:  Likely from eslphagitis vs iatrogenic Given protonix in ED,  will keep him on protonix drip  Will check H/H every 6 hours and transfuse as needed NG tube placed in ED. Keep NPO GI consulted by ED; they will see pt in am Admit to stepdown   SVT:  Maybe related to above, no hx of arrythemia per pt Keep on tele, consult cardiology Currently hemodynameically stable  Hx of stroke: hold asp/plavix due to bleeding.   N/V: due to above, zofran prn.   Input & Output: NA  Lines &  Tubes: PIV DVT prophylaxis: SCDs GI prophylaxis: PPI Consultants: GI / cardiology Code Status: Full  Family Communication: grandson at bedside.   Disposition Plan: TBD    Gennaro Africa M.D [facility]

## 2017-03-03 NOTE — ED Triage Notes (Signed)
According to family, pt has been vomiting "black stuff" all day, about 10 times.  Pulse is 140, pt complains of weakness and being tired.  History of GI problems.  Pt appears pale.

## 2017-03-04 ENCOUNTER — Encounter (HOSPITAL_COMMUNITY): Payer: Self-pay | Admitting: *Deleted

## 2017-03-04 ENCOUNTER — Inpatient Hospital Stay (HOSPITAL_COMMUNITY): Payer: Medicare Other | Admitting: Anesthesiology

## 2017-03-04 ENCOUNTER — Telehealth: Payer: Self-pay

## 2017-03-04 ENCOUNTER — Encounter (HOSPITAL_COMMUNITY)
Admission: EM | Disposition: A | Discharge status: Home / Self Care | Payer: Self-pay | Source: Home / Self Care | Attending: Internal Medicine

## 2017-03-04 DIAGNOSIS — K920 Hematemesis: Secondary | ICD-10-CM

## 2017-03-04 DIAGNOSIS — K449 Diaphragmatic hernia without obstruction or gangrene: Secondary | ICD-10-CM

## 2017-03-04 DIAGNOSIS — I639 Cerebral infarction, unspecified: Secondary | ICD-10-CM

## 2017-03-04 DIAGNOSIS — I471 Supraventricular tachycardia: Secondary | ICD-10-CM

## 2017-03-04 DIAGNOSIS — K922 Gastrointestinal hemorrhage, unspecified: Secondary | ICD-10-CM

## 2017-03-04 DIAGNOSIS — I517 Cardiomegaly: Secondary | ICD-10-CM

## 2017-03-04 DIAGNOSIS — K2211 Ulcer of esophagus with bleeding: Principal | ICD-10-CM

## 2017-03-04 DIAGNOSIS — R066 Hiccough: Secondary | ICD-10-CM

## 2017-03-04 HISTORY — PX: ESOPHAGOGASTRODUODENOSCOPY: SHX5428

## 2017-03-04 LAB — GLUCOSE, CAPILLARY
Glucose-Capillary: 137 mg/dL — ABNORMAL HIGH (ref 65–99)
Glucose-Capillary: 132 mg/dL — ABNORMAL HIGH (ref 65–99)
Glucose-Capillary: 144 mg/dL — ABNORMAL HIGH (ref 65–99)

## 2017-03-04 LAB — COMPREHENSIVE METABOLIC PANEL
ALK PHOS: 63 U/L (ref 38–126)
ALT: 10 U/L — AB (ref 17–63)
ANION GAP: 8 (ref 5–15)
AST: 18 U/L (ref 15–41)
Albumin: 3 g/dL — ABNORMAL LOW (ref 3.5–5.0)
BILIRUBIN TOTAL: 0.5 mg/dL (ref 0.3–1.2)
BUN: 28 mg/dL — ABNORMAL HIGH (ref 6–20)
CALCIUM: 8.3 mg/dL — AB (ref 8.9–10.3)
CO2: 23 mmol/L (ref 22–32)
CREATININE: 1.26 mg/dL — AB (ref 0.61–1.24)
Chloride: 108 mmol/L (ref 101–111)
GFR, EST NON AFRICAN AMERICAN: 52 mL/min — AB (ref >60)
Glucose, Bld: 133 mg/dL — ABNORMAL HIGH (ref 65–99)
Potassium: 3.8 mmol/L (ref 3.5–5.1)
SODIUM: 139 mmol/L (ref 135–145)
TOTAL PROTEIN: 6 g/dL — AB (ref 6.5–8.1)

## 2017-03-04 LAB — HEMOGLOBIN AND HEMATOCRIT, BLOOD
HCT: 26.8 % — ABNORMAL LOW (ref 39.0–52.0)
HEMATOCRIT: 27.7 % — AB (ref 39.0–52.0)
HEMATOCRIT: 30.1 % — AB (ref 39.0–52.0)
HEMOGLOBIN: 8.3 g/dL — AB (ref 13.0–17.0)
HEMOGLOBIN: 8 g/dL — AB (ref 13.0–17.0)
HEMOGLOBIN: 9.2 g/dL — AB (ref 13.0–17.0)

## 2017-03-04 LAB — PROTIME-INR
INR: 1.13
PROTHROMBIN TIME: 14.4 s (ref 11.4–15.2)

## 2017-03-04 LAB — APTT: aPTT: 27 seconds (ref 24–36)

## 2017-03-04 LAB — MRSA PCR SCREENING: MRSA by PCR: NEGATIVE

## 2017-03-04 SURGERY — EGD (ESOPHAGOGASTRODUODENOSCOPY)
Anesthesia: Monitor Anesthesia Care

## 2017-03-04 MED ORDER — LIDOCAINE 2% (20 MG/ML) 5 ML SYRINGE
INTRAMUSCULAR | Status: DC | PRN
  Administered 2017-03-04: 50 mg via INTRAVENOUS

## 2017-03-04 MED ORDER — SUCRALFATE 1 GM/10ML PO SUSP
1.0000 g | Freq: Three times a day (TID) | ORAL | Status: DC
  Administered 2017-03-04 – 2017-03-06 (×7): 1 g via ORAL
  Filled 2017-03-04 (×7): qty 10

## 2017-03-04 MED ORDER — PROPOFOL 500 MG/50ML IV EMUL
INTRAVENOUS | Status: DC | PRN
  Administered 2017-03-04: 100 ug/kg/min via INTRAVENOUS

## 2017-03-04 MED ORDER — PROPOFOL 10 MG/ML IV BOLUS
INTRAVENOUS | Status: DC | PRN
  Administered 2017-03-04 (×3): 10 mg via INTRAVENOUS

## 2017-03-04 MED ORDER — SODIUM CHLORIDE 0.9 % IV SOLN
INTRAVENOUS | Status: DC
  Administered 2017-03-04: 13:00:00 via INTRAVENOUS

## 2017-03-04 MED ORDER — SODIUM CHLORIDE 0.9 % IV SOLN
25.0000 mg | Freq: Once | INTRAVENOUS | Status: AC
  Administered 2017-03-04: 25 mg via INTRAVENOUS
  Filled 2017-03-04 (×2): qty 1

## 2017-03-04 MED ORDER — GLYCOPYRROLATE 0.2 MG/ML IV SOSY
PREFILLED_SYRINGE | INTRAVENOUS | Status: DC | PRN
  Administered 2017-03-04: .2 mg via INTRAVENOUS

## 2017-03-04 MED ORDER — FAMOTIDINE 20 MG PO TABS
40.0000 mg | ORAL_TABLET | Freq: Every day | ORAL | Status: DC
  Administered 2017-03-04 – 2017-03-06 (×3): 40 mg via ORAL
  Filled 2017-03-04 (×3): qty 2

## 2017-03-04 MED ORDER — ONDANSETRON HCL 4 MG/2ML IJ SOLN: 4.0000 mg | Freq: Once | INTRAMUSCULAR | Status: DC | PRN

## 2017-03-04 NOTE — Telephone Encounter (Signed)
Referral placed  Dr. Carlean Purl is this follow up ok?

## 2017-03-04 NOTE — Op Note (Addendum)
[facility] Patient Name: Logan [name] Procedure Date : 03/04/2017 MRN: [id number] Attending MD: Mauri Pole , MD Date of Birth: Nov 14, 1934 CSN: [id number] Age: 82 Admit Type: Inpatient Procedure:                Upper GI endoscopy Indications:              Active gastrointestinal bleeding,hematemesis.                            Suspected upper gastrointestinal bleeding Providers:                Mauri Pole, MD, Angus Seller, Cherylynn Ridges, Technician, Edmonia James, CRNA Referring MD:              Medicines:                Monitored Anesthesia Care Complications:            No immediate complications. Estimated Blood Loss:     Estimated blood loss was minimal. Procedure:                Pre-Anesthesia Assessment:                           - Prior to the procedure, a History and Physical                            was performed, and patient medications and                            allergies were reviewed. The patient's tolerance of                            previous anesthesia was also reviewed. The risks                            and benefits of the procedure and the sedation                            options and risks were discussed with the patient.                            All questions were answered, and informed consent                            was obtained. Prior Anticoagulants: The patient                            last took aspirin 2 days and Plavix (clopidogrel) 2                            days prior to the procedure. ASA Grade Assessment:  III - A patient with severe systemic disease. After                            reviewing the risks and benefits, the patient was                            deemed in satisfactory condition to undergo the                            procedure.                           After obtaining informed consent, the endoscope was    passed under direct vision. Throughout the                            procedure, the patient's blood pressure, pulse, and                            oxygen saturations were monitored continuously. The                            EG-[id number] ([id number]) scope was introduced through the                            mouth, and advanced to the second part of duodenum.                            The upper GI endoscopy was accomplished without                            difficulty. The patient tolerated the procedure                            well. Scope In: Scope Out: Findings:      Many linear esophageal ulcers with oozing blood and no stigmata of       recent bleeding were found 30 to 35 cm from the incisors. The largest       lesion was 20 mm in largest dimension. Biopsies were taken with a cold       forceps for histology.      A medium-sized ~7cm hiatal hernia was present.      A few non-bleeding localized erosions were found in the gastric antrum.       There were no stigmata of recent bleeding. Likely secondary to NG tube       suction trauma      The cardia and gastric fundus were normal on retroflexion.      The examined duodenum was normal. Impression:               - Bleeding esophageal ulcers. Biopsied.                           - Medium-sized hiatal hernia.                           -  Gastric erosions without bleeding secondary to NG                            tube suction trauma.                           - Normal examined duodenum. Moderate Sedation:      N/A Recommendation:           - Advance diet as tolerated today.                           - Continue present medications.                           - Await pathology results.                           - No ibuprofen, naproxen, or other non-steroidal                            anti-inflammatory drugs.                           - Resume Plavix (clopidogrel) at prior dose in 3                            days. Refer to managing  physician for further                            adjustment of therapy.                           - Use Protonix (pantoprazole) 40 mg PO BID                            indefinitely.                           - Use Zantac (ranitidine) 300 mg PO daily at                            bedtime.                           - Use sucralfate suspension 1 gram PO QID for 3                            months.                           - Will send referral to [facility] (Dr                            Narda Amber) as outpatient for management of                            intractable hiccups  Procedure Code(s):        --- Professional ---                           [phone], Esophagogastroduodenoscopy, flexible,                            transoral; with biopsy, single or multiple Diagnosis Code(s):        --- Professional ---                           K22.11, Ulcer of esophagus with bleeding                           K44.9, Diaphragmatic hernia without obstruction or                            gangrene                           K25.9, Gastric ulcer, unspecified as acute or                            chronic, without hemorrhage or perforation                           K92.2, Gastrointestinal hemorrhage, unspecified CPT copyright 2016 [employer]. All rights reserved. The codes documented in this report are preliminary and upon coder review may  be revised to meet current compliance requirements. Mauri Pole, MD 03/04/2017 1:57:14 PM This report has been signed electronically. Number of Addenda: 0

## 2017-03-04 NOTE — Interval H&P Note (Signed)
History and Physical Interval Note:  03/04/2017 1:11 PM  Logan [name]  has presented today for surgery, with the diagnosis of dark, maroon emesis, heartburn, hiccups  The various methods of treatment have been discussed with the patient and family. After consideration of risks, benefits and other options for treatment, the patient has consented to  Procedure(s): ESOPHAGOGASTRODUODENOSCOPY (EGD) (N/A) as a surgical intervention .  The patient's history has been reviewed, patient examined, no change in status, stable for surgery.  I have reviewed the patient's chart and labs.  Questions were answered to the patient's satisfaction.     Logan [name]

## 2017-03-04 NOTE — Consult Note (Signed)
[facility] Consult: 8:13 AM 03/04/2017  LOS: 1 day    Referring Provider: Clare Charon  Primary Care Physician:  ???:  In [place] Primary Gastroenterologist:  Dr. Carlean Purl and an MD in [place].     Reason for Consultation:  GI bleed   HPI: Logan [name] is a 82 y.o. male.  PMH GERD with recalcitrant vomiting treated with Domperidone in past. .  CVA 02/2016 on Plavix.  Anemia.  CKD stage 3.     In 2017, early 2018 [place] fundoplication recommended for persistent hiccups 11/2016 EGD for hiccups, emesis, mild anemia, thickened esophagus on CT.  Grade A esophagitis, 6 cm HH, gastric H Pylori clotest negative.  2008 Colonoscopy for IDA: scattered tiny tics and small int hemorrhoids.  EGD: small HH, chronic gastritis, + Clo was treated.  2007 Gastric emptying study normal.    Meds include Omeprazole 40 mg daily, Carafate qid, Thorazine prn.   Seen by his MD in [place] for recalcitrant hiccups, Thorazine not as effective as previously.  They, along with pyrosis, have been worse in last few days and preventing him from eating/drinking adequately.  Yesterday he began vomiting dark, maroon liquid on several occasions.  Stools brown, no blood.  In recent months having RLQ pain in region of previous inguinal hernia repair but not having epigastric or upper abd pain. Brought to ED  Hgb 10 >> 11.9 >> 9.2 >> 8.0 over 11 hours, (was 12.5 on 11/26/16).   MCV 72 (was 80 on 11/26/16).   WBCs 17.2.  U/A unremarkable.   Coags normal.   Glucose 278.  + AKI. CXR with atherosclerotic, tortuous aorta.       Past Medical History:  Diagnosis Date  . Anemia   . Cataract   . Gastritis    proximal  . Hiatal hernia   . Hyperlipidemia   . Stroke [facility])    r side deficits; September 2017    Past Surgical History:  Procedure  Laterality Date  . COLONOSCOPY    . ESOPHAGOGASTRODUODENOSCOPY    . UPPER GASTROINTESTINAL ENDOSCOPY      Prior to Admission medications   Medication Sig Start Date End Date Taking? Authorizing Provider  chlorproMAZINE (THORAZINE) 25 MG tablet Take 1 tablet (25 mg total) by mouth 3 (three) times daily as needed. Patient taking differently: Take 25 mg by mouth 3 (three) times daily as needed for hiccoughs.  12/01/16  Yes [name], MD  clopidogrel (PLAVIX) 75 MG tablet Take 75 mg by mouth daily.   Yes [provider]  metoCLOPramide (REGLAN) 10 MG tablet Take 10 mg by mouth 4 (four) times daily.   Yes [provider]  mirtazapine (REMERON) 15 MG tablet Take 15 mg by mouth at bedtime.   Yes [provider]  omeprazole (PRILOSEC) 40 MG capsule Take 40 mg by mouth daily.   Yes [provider]  ondansetron (ZOFRAN ODT) 4 MG disintegrating tablet 4mg  ODT q4 hours prn nausea/vomit 11/26/16  Yes Malvin Johns, MD  sucralfate (CARAFATE) 1 g tablet Take 1  g by mouth 4 (four) times daily.   Yes [provider]  Vitamin D, Ergocalciferol, (DRISDOL) 50000 units CAPS capsule Take 50,000 Units by mouth every 7 (seven) days.   Yes [provider]    Scheduled Meds: . [START ON 03/07/2017] pantoprazole  40 mg Intravenous Q12H   Infusions: . dextrose 5 % and 0.9% NaCl 100 mL/hr at 03/04/17 0053  . pantoprozole (PROTONIX) infusion 8 mg/hr (03/04/17 0106)   PRN Meds: ondansetron (ZOFRAN) IV   Allergies as of 03/03/2017  . (No Known Allergies)    Family History  Problem Relation Age of Onset  . Colon cancer Neg Hx   . Esophageal cancer Neg Hx   . Rectal cancer Neg Hx     Social History   Social History  . Marital status: Married    Spouse name: N/A  . Number of children: N/A  . Years of education: N/A   Occupational History  . retired    Social History Main Topics  . Smoking status: Never Smoker  . Smokeless tobacco: Never Used    . Alcohol use No  . Drug use: No  . Sexual activity: Not on file   Other Topics Concern  . Not on file   Social History Narrative   Married 2 sons (at least)   Spends time with them in [place] and [place]    REVIEW OF SYSTEMS: Constitutional:  Stable weight.  Weak, no profound fatigue ENT:  No nose bleeds Pulm:  + cough, mostly non-productive CV:  No palpitations, no LE edema. No chest pain GU:  No hematuria, no frequency GI:  Per HPI Heme:  No unusual bleeding or bruising   Transfusions:  none Neuro:  No headaches, no peripheral tingling or numbness Derm:  No itching, no rash or sores.  Endocrine:  No sweats or chills.  No polyuria or dysuria Immunization:  Not queried Travel:  None beyond local counties in last few months.    PHYSICAL EXAM: Vital signs in last 24 hours: Vitals:   03/04/17 0635 03/04/17 0802  BP: (!) 117/94 139/80  Pulse: (!) 116 85  Resp: 19 13  Temp:  98.1 F (36.7 C)  SpO2: 100% 100%   Wt Readings from Last 3 Encounters:  03/03/17 65.8 kg (145 lb)  12/02/16 64.9 kg (143 lb)  12/01/16 64.9 kg (143 lb)    General: frail, pale, looks unwell Head:  No asymmetry or swelling  Eyes:  No icterus or conj pallor Ears:  Not HOH  Nose:  No discharge, NGT in place: dark and bilious effluent Mouth:  Moist, clear oral MM.  Tongue midline Neck:  No JVD or masses.  No TMG Lungs:  Clear bil.  Some coughing, no dyspnea Heart: RRR, no MRG.  S1, S2 present Abdomen:  Soft, NT, ND.  Thin with no masses, HSM, hernias, bruits.  BS hypoactive.   Rectal: deferred   Musc/Skeltl: no joint redness, swelling, contracture deformity Extremities:   No CCE Neurologic:  Alert, oriented x 3.  Speech slow but clear.  No tremor.  Moves all 4 limbs, strength not tested Skin:  No sores, rashes, purpura or suspicious lesions Tattoos:  none Nodes:  No cervical adenopathy.     Psych:  Cooperative, calm.    Intake/Output from previous day: 09/06 0701 - 09/07 0700 In: 2884.2  [I.V.:884.2; IV Piggyback:2000] Out: 100 [Emesis/NG output:100] Intake/Output this shift: Total I/O In: -  Out: 150 [Urine:150]  LAB RESULTS:  Recent Labs  03/03/17  2011 03/03/17 2050 03/04/17 0047 03/04/17 0715  WBC 17.2*  --   --   --   HGB 10.0* 11.9* 9.2* 8.0*  HCT 33.3* 35.0* 30.1* 26.8*  PLT 401*  --   --   --    BMET Lab Results  Component Value Date   NA 136 03/03/2017   NA 134 (L) 03/03/2017   NA 138 11/26/2016   K 4.2 03/03/2017   K 4.3 03/03/2017   K 3.9 11/26/2016   CL 100 (L) 03/03/2017   CL 100 (L) 03/03/2017   CL 103 11/26/2016   CO2 20 (L) 03/03/2017   CO2 23 11/26/2016   CO2 30 07/07/2006   GLUCOSE 275 (H) 03/03/2017   GLUCOSE 278 (H) 03/03/2017   GLUCOSE 109 (H) 11/26/2016   BUN 41 (H) 03/03/2017   BUN 43 (H) 03/03/2017   BUN 31 (H) 11/26/2016   CREATININE 1.60 (H) 03/03/2017   CREATININE 1.68 (H) 03/03/2017   CREATININE 1.50 (H) 11/26/2016   CALCIUM 9.9 03/03/2017   CALCIUM 8.3 (L) 11/26/2016   CALCIUM 9.1 07/07/2006   LFT  Recent Labs  03/03/17 2011  PROT 7.4  ALBUMIN 3.8  AST 22  ALT 12*  ALKPHOS 83  BILITOT 0.5   PT/INR Lab Results  Component Value Date   INR 1.13 03/04/2017   Hepatitis Panel No results for input(s): HEPBSAG, HCVAB, HEPAIGM, HEPBIGM in the last 72 hours. C-Diff No components found for: CDIFF Lipase     Component Value Date/Time   LIPASE 29 03/03/2017 2011    Drugs of Abuse  No results found for: LABOPIA, COCAINSCRNUR, LABBENZ, AMPHETMU, THCU, LABBARB   RADIOLOGY STUDIES: US Renal  Result Date: 03/04/2017 CLINICAL DATA:  Acute kidney injury. EXAM: RENAL / URINARY TRACT ULTRASOUND COMPLETE COMPARISON:  CT 11/26/2016 FINDINGS: Right Kidney: Length: 9.8 cm. Kidney is partially obscured by adjacent rib shadowing. Echogenicity grossly within normal limits. No hydronephrosis. No visualized mass. Left Kidney: Length: 9.4 cm. Echogenicity within normal limits. No hydronephrosis. Small cyst on prior exam is  not well seen sonographically. Bladder: Appears normal for degree of bladder distention. IMPRESSION: Unremarkable renal ultrasound. Electronically Signed   By: Jeb Levering M.D.   On: 03/04/2017 00:20   Dg Chest Port 1 View  Result Date: 03/03/2017 CLINICAL DATA:  Hematemesis.  Weakness. EXAM: PORTABLE CHEST 1 VIEW COMPARISON:  Remote radiographs 07/07/2006 FINDINGS: Heart size upper normal. Atherosclerosis and tortuosity of the thoracic aorta. No pulmonary edema. No consolidation, pleural fluid or pneumothorax. Remote left rib fractures. IMPRESSION: 1. No acute chest finding. 2. Tortuous atherosclerotic thoracic aorta. Electronically Signed   By: Jeb Levering M.D.   On: 03/03/2017 21:04     IMPRESSION:   *  CG emesis vs hematemesis in pt with recalcitrant hiccups.   EGD 3 months ago with mild esophagitis, 6 cm HH and negative clotest.  ? MW tear vs worsening esophagitis in setting persistent hiccups?  Doubt ulcer.  Suspect hiccups are CNS/central in nature resulting from CVA.    *  Microcytic anemia  *  AKI:  None.    *  Leucocytosis without clear source: U/A and CXR unrevealing.    *  Hyperglycemia.  No previous dx of DM  *  Hx CVA 2017.  On Plavix. Last dose was 9/5.       PLAN:     *  EGD this afternoon.    Azucena Freed  03/04/2017, 8:13 AM Pager: [phone]   Attending physician's note   I  have taken a history, examined the patient and reviewed the chart. I agree with the Advanced Practitioner's note, impression and recommendations.  64 yr M s/p CVA on Aspirin and Plavix presented with coffee ground emesis. He has persistent hiccups with no improvement despite acid suppression, thorazine. According to wife he stops eating when he a exacerbation of hiccups but he has constant hiccups. He had few episodes of coffee ground emesis yesterday. 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 All questions answered. The patient agrees to proceed.  Damaris Hippo, MD [phone] Mon-Fri 8a-5p [phone] after 5p, weekends, holidays

## 2017-03-04 NOTE — Telephone Encounter (Signed)
I would put him into my 10/3 banding opening

## 2017-03-04 NOTE — H&P (View-Only) (Signed)
[facility] Consult: 8:13 AM 03/04/2017  LOS: 1 day    Referring Provider: Clare Charon  Primary Care Physician:  ???:  In [place] Primary Gastroenterologist:  Dr. Carlean Purl and an MD in [place].     Reason for Consultation:  GI bleed   HPI: Logan [name] is a 82 y.o. male.  PMH GERD with recalcitrant vomiting treated with Domperidone in past. .  CVA 02/2016 on Plavix.  Anemia.  CKD stage 3.     In 2017, early 2018 [place] fundoplication recommended for persistent hiccups 11/2016 EGD for hiccups, emesis, mild anemia, thickened esophagus on CT.  Grade A esophagitis, 6 cm HH, gastric H Pylori clotest negative.  2008 Colonoscopy for IDA: scattered tiny tics and small int hemorrhoids.  EGD: small HH, chronic gastritis, + Clo was treated.  2007 Gastric emptying study normal.    Meds include Omeprazole 40 mg daily, Carafate qid, Thorazine prn.   Seen by his MD in [place] for recalcitrant hiccups, Thorazine not as effective as previously.  They, along with pyrosis, have been worse in last few days and preventing him from eating/drinking adequately.  Yesterday he began vomiting dark, maroon liquid on several occasions.  Stools brown, no blood.  In recent months having RLQ pain in region of previous inguinal hernia repair but not having epigastric or upper abd pain. Brought to ED  Hgb 10 >> 11.9 >> 9.2 >> 8.0 over 11 hours, (was 12.5 on 11/26/16).   MCV 72 (was 80 on 11/26/16).   WBCs 17.2.  U/A unremarkable.   Coags normal.   Glucose 278.  + AKI. CXR with atherosclerotic, tortuous aorta.       Past Medical History:  Diagnosis Date  . Anemia   . Cataract   . Gastritis    proximal  . Hiatal hernia   . Hyperlipidemia   . Stroke [facility])    r side deficits; September 2017    Past Surgical History:  Procedure  Laterality Date  . COLONOSCOPY    . ESOPHAGOGASTRODUODENOSCOPY    . UPPER GASTROINTESTINAL ENDOSCOPY      Prior to Admission medications   Medication Sig Start Date End Date Taking? Authorizing Provider  chlorproMAZINE (THORAZINE) 25 MG tablet Take 1 tablet (25 mg total) by mouth 3 (three) times daily as needed. Patient taking differently: Take 25 mg by mouth 3 (three) times daily as needed for hiccoughs.  12/01/16  Yes [name], MD  clopidogrel (PLAVIX) 75 MG tablet Take 75 mg by mouth daily.   Yes [provider]  metoCLOPramide (REGLAN) 10 MG tablet Take 10 mg by mouth 4 (four) times daily.   Yes [provider]  mirtazapine (REMERON) 15 MG tablet Take 15 mg by mouth at bedtime.   Yes [provider]  omeprazole (PRILOSEC) 40 MG capsule Take 40 mg by mouth daily.   Yes [provider]  ondansetron (ZOFRAN ODT) 4 MG disintegrating tablet 4mg  ODT q4 hours prn nausea/vomit 11/26/16  Yes Malvin Johns, MD  sucralfate (CARAFATE) 1 g tablet Take 1  g by mouth 4 (four) times daily.   Yes [provider]  Vitamin D, Ergocalciferol, (DRISDOL) 50000 units CAPS capsule Take 50,000 Units by mouth every 7 (seven) days.   Yes [provider]    Scheduled Meds: . [START ON 03/07/2017] pantoprazole  40 mg Intravenous Q12H   Infusions: . dextrose 5 % and 0.9% NaCl 100 mL/hr at 03/04/17 0053  . pantoprozole (PROTONIX) infusion 8 mg/hr (03/04/17 0106)   PRN Meds: ondansetron (ZOFRAN) IV   Allergies as of 03/03/2017  . (No Known Allergies)    Family History  Problem Relation Age of Onset  . Colon cancer Neg Hx   . Esophageal cancer Neg Hx   . Rectal cancer Neg Hx     Social History   Social History  . Marital status: Married    Spouse name: N/A  . Number of children: N/A  . Years of education: N/A   Occupational History  . retired    Social History Main Topics  . Smoking status: Never Smoker  . Smokeless tobacco: Never Used    . Alcohol use No  . Drug use: No  . Sexual activity: Not on file   Other Topics Concern  . Not on file   Social History Narrative   Married 2 sons (at least)   Spends time with them in [place] and [place]    REVIEW OF SYSTEMS: Constitutional:  Stable weight.  Weak, no profound fatigue ENT:  No nose bleeds Pulm:  + cough, mostly non-productive CV:  No palpitations, no LE edema. No chest pain GU:  No hematuria, no frequency GI:  Per HPI Heme:  No unusual bleeding or bruising   Transfusions:  none Neuro:  No headaches, no peripheral tingling or numbness Derm:  No itching, no rash or sores.  Endocrine:  No sweats or chills.  No polyuria or dysuria Immunization:  Not queried Travel:  None beyond local counties in last few months.    PHYSICAL EXAM: Vital signs in last 24 hours: Vitals:   03/04/17 0635 03/04/17 0802  BP: (!) 117/94 139/80  Pulse: (!) 116 85  Resp: 19 13  Temp:  98.1 F (36.7 C)  SpO2: 100% 100%   Wt Readings from Last 3 Encounters:  03/03/17 65.8 kg (145 lb)  12/02/16 64.9 kg (143 lb)  12/01/16 64.9 kg (143 lb)    General: frail, pale, looks unwell Head:  No asymmetry or swelling  Eyes:  No icterus or conj pallor Ears:  Not HOH  Nose:  No discharge, NGT in place: dark and bilious effluent Mouth:  Moist, clear oral MM.  Tongue midline Neck:  No JVD or masses.  No TMG Lungs:  Clear bil.  Some coughing, no dyspnea Heart: RRR, no MRG.  S1, S2 present Abdomen:  Soft, NT, ND.  Thin with no masses, HSM, hernias, bruits.  BS hypoactive.   Rectal: deferred   Musc/Skeltl: no joint redness, swelling, contracture deformity Extremities:   No CCE Neurologic:  Alert, oriented x 3.  Speech slow but clear.  No tremor.  Moves all 4 limbs, strength not tested Skin:  No sores, rashes, purpura or suspicious lesions Tattoos:  none Nodes:  No cervical adenopathy.     Psych:  Cooperative, calm.    Intake/Output from previous day: 09/06 0701 - 09/07 0700 In: 2884.2  [I.V.:884.2; IV Piggyback:2000] Out: 100 [Emesis/NG output:100] Intake/Output this shift: Total I/O In: -  Out: 150 [Urine:150]  LAB RESULTS:  Recent Labs  03/03/17  2011 03/03/17 2050 03/04/17 0047 03/04/17 0715  WBC 17.2*  --   --   --   HGB 10.0* 11.9* 9.2* 8.0*  HCT 33.3* 35.0* 30.1* 26.8*  PLT 401*  --   --   --    BMET Lab Results  Component Value Date   NA 136 03/03/2017   NA 134 (L) 03/03/2017   NA 138 11/26/2016   K 4.2 03/03/2017   K 4.3 03/03/2017   K 3.9 11/26/2016   CL 100 (L) 03/03/2017   CL 100 (L) 03/03/2017   CL 103 11/26/2016   CO2 20 (L) 03/03/2017   CO2 23 11/26/2016   CO2 30 07/07/2006   GLUCOSE 275 (H) 03/03/2017   GLUCOSE 278 (H) 03/03/2017   GLUCOSE 109 (H) 11/26/2016   BUN 41 (H) 03/03/2017   BUN 43 (H) 03/03/2017   BUN 31 (H) 11/26/2016   CREATININE 1.60 (H) 03/03/2017   CREATININE 1.68 (H) 03/03/2017   CREATININE 1.50 (H) 11/26/2016   CALCIUM 9.9 03/03/2017   CALCIUM 8.3 (L) 11/26/2016   CALCIUM 9.1 07/07/2006   LFT  Recent Labs  03/03/17 2011  PROT 7.4  ALBUMIN 3.8  AST 22  ALT 12*  ALKPHOS 83  BILITOT 0.5   PT/INR Lab Results  Component Value Date   INR 1.13 03/04/2017   Hepatitis Panel No results for input(s): HEPBSAG, HCVAB, HEPAIGM, HEPBIGM in the last 72 hours. C-Diff No components found for: CDIFF Lipase     Component Value Date/Time   LIPASE 29 03/03/2017 2011    Drugs of Abuse  No results found for: LABOPIA, COCAINSCRNUR, LABBENZ, AMPHETMU, THCU, LABBARB   RADIOLOGY STUDIES: US Renal  Result Date: 03/04/2017 CLINICAL DATA:  Acute kidney injury. EXAM: RENAL / URINARY TRACT ULTRASOUND COMPLETE COMPARISON:  CT 11/26/2016 FINDINGS: Right Kidney: Length: 9.8 cm. Kidney is partially obscured by adjacent rib shadowing. Echogenicity grossly within normal limits. No hydronephrosis. No visualized mass. Left Kidney: Length: 9.4 cm. Echogenicity within normal limits. No hydronephrosis. Small cyst on prior exam is  not well seen sonographically. Bladder: Appears normal for degree of bladder distention. IMPRESSION: Unremarkable renal ultrasound. Electronically Signed   By: Jeb Levering M.D.   On: 03/04/2017 00:20   Dg Chest Port 1 View  Result Date: 03/03/2017 CLINICAL DATA:  Hematemesis.  Weakness. EXAM: PORTABLE CHEST 1 VIEW COMPARISON:  Remote radiographs 07/07/2006 FINDINGS: Heart size upper normal. Atherosclerosis and tortuosity of the thoracic aorta. No pulmonary edema. No consolidation, pleural fluid or pneumothorax. Remote left rib fractures. IMPRESSION: 1. No acute chest finding. 2. Tortuous atherosclerotic thoracic aorta. Electronically Signed   By: Jeb Levering M.D.   On: 03/03/2017 21:04     IMPRESSION:   *  CG emesis vs hematemesis in pt with recalcitrant hiccups.   EGD 3 months ago with mild esophagitis, 6 cm HH and negative clotest.  ? MW tear vs worsening esophagitis in setting persistent hiccups?  Doubt ulcer.  Suspect hiccups are CNS/central in nature resulting from CVA.    *  Microcytic anemia  *  AKI:  None.    *  Leucocytosis without clear source: U/A and CXR unrevealing.    *  Hyperglycemia.  No previous dx of DM  *  Hx CVA 2017.  On Plavix. Last dose was 9/5.       PLAN:     *  EGD this afternoon.    Azucena Freed  03/04/2017, 8:13 AM Pager: [phone]   Attending physician's note   I  have taken a history, examined the patient and reviewed the chart. I agree with the Advanced Practitioner's note, impression and recommendations.  10 yr M s/p CVA on Aspirin and Plavix presented with coffee ground emesis. He has persistent hiccups with no improvement despite acid suppression, thorazine. According to wife he stops eating when he a exacerbation of hiccups but he has constant hiccups. He had few episodes of coffee ground emesis yesterday. 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 All questions answered. The patient agrees to proceed.  Damaris Hippo, MD [phone] Mon-Fri 8a-5p [phone] after 5p, weekends, holidays

## 2017-03-04 NOTE — Transfer of Care (Signed)
Immediate Anesthesia Transfer of Care Note  Patient: Logan [name]  Procedure(s) Performed: Procedure(s): ESOPHAGOGASTRODUODENOSCOPY (EGD) (N/A)  Patient Location: Endoscopy Unit  Anesthesia Type:MAC  Level of Consciousness: drowsy  Airway & Oxygen Therapy: Patient Spontanous Breathing and Patient connected to nasal cannula oxygen  Post-op Assessment: Report given to RN and Post -op Vital signs reviewed and stable  Post vital signs: Reviewed and stable  Last Vitals:  Vitals:   03/04/17 1054 03/04/17 1230  BP: (!) 145/95 117/74  Pulse: 99 81  Resp: 14 15  Temp: 37.2 C 36.9 C  SpO2: 100% 100%    Last Pain:  Vitals:   03/04/17 1230  TempSrc: Oral         Complications: No apparent anesthesia complications

## 2017-03-04 NOTE — Anesthesia Postprocedure Evaluation (Signed)
Anesthesia Post Note  Patient: Logan [name]  Procedure(s) Performed: Procedure(s) (LRB): ESOPHAGOGASTRODUODENOSCOPY (EGD) (N/A)     Patient location during evaluation: PACU Anesthesia Type: MAC Level of consciousness: awake and alert and oriented Pain management: pain level controlled Vital Signs Assessment: post-procedure vital signs reviewed and stable Respiratory status: spontaneous breathing, nonlabored ventilation and respiratory function stable Cardiovascular status: stable and blood pressure returned to baseline Postop Assessment: no signs of nausea or vomiting Anesthetic complications: no    Last Vitals:  Vitals:   03/04/17 1420 03/04/17 1608  BP: 110/89 (!) 153/83  Pulse: (!) 104 (!) 111  Resp: 16 16  Temp: 36.8 C   SpO2: 99% 99%    Last Pain:  Vitals:   03/04/17 1420  TempSrc: Oral                 Malayshia All A.

## 2017-03-04 NOTE — Anesthesia Preprocedure Evaluation (Signed)
Anesthesia Evaluation  Patient identified by MRN, date of birth, ID band Patient awake    Reviewed: Allergy & Precautions, NPO status , Patient's Chart, lab work & pertinent test results  Airway Mallampati: III  TM Distance: >3 FB Neck ROM: Full    Dental no notable dental hx. (+) Teeth Intact   Pulmonary neg pulmonary ROS,    Pulmonary exam normal breath sounds clear to auscultation       Cardiovascular negative cardio ROS Normal cardiovascular exam Rhythm:Regular Rate:Normal     Neuro/Psych Right sided weakness  Neuromuscular disease CVA, Residual Symptoms negative psych ROS   GI/Hepatic Neg liver ROS, hiatal hernia, GERD  Medicated and Controlled,Upper GI bleeding Hematemesis   Endo/Other  negative endocrine ROS  Renal/GU negative Renal ROS  negative genitourinary   Musculoskeletal negative musculoskeletal ROS (+)   Abdominal   Peds  Hematology  (+) anemia , Plavix last dose    Anesthesia Other Findings   Reproductive/Obstetrics                             Anesthesia Physical Anesthesia Plan  ASA: III  Anesthesia Plan: MAC   Post-op Pain Management:    Induction:   PONV Risk Score and Plan: 1 and Ondansetron, Dexamethasone and Treatment may vary due to age or medical condition  Airway Management Planned: Natural Airway and Nasal Cannula  Additional Equipment:   Intra-op Plan:   Post-operative Plan:   Informed Consent: I have reviewed the patients History and Physical, chart, labs and discussed the procedure including the risks, benefits and alternatives for the proposed anesthesia with the patient or authorized representative who has indicated his/her understanding and acceptance.   Dental advisory given  Plan Discussed with: CRNA, Anesthesiologist and Surgeon  Anesthesia Plan Comments:         Anesthesia Quick Evaluation

## 2017-03-04 NOTE — Progress Notes (Signed)
PROGRESS NOTE    Logan [name]  GGY:[id number] DOB: Mar 22, 1935 DOA: 03/03/2017 PCP: Allie Bossier, MD   Brief Narrative:  82 yo  does not speak English from [place] PMHx;  CVA, Gastritis, Hiatal Hernia, HLD, Anemia   History taken was challenging due to language barrier and grandson is not fully aware of medical history. Pt said he has been vomiting "black stuff" all day but denied any red blood or hematochezia. He has mild cough but denied hemoptysis. No abdominal pain. No dyspnea/chest pain. No fever/chills. He reported feeling fatigue and tired. No other complaints. He had EGDs before, last one was done in June and showed esophagitis and hiatal hernia but otherwise normal.  Report of EGD form June: - LA GRADE A ESOPHAGITIS . - 6 cm hiatal hernia . -Examination otherwise normal. - Biopsies taken with cold forceps for H pylori testing using CLOtestt.    Subjective: 9/7 A/O 4, negative CP, negative SOB positive refractory hiccups per son in [place] patient has had refractory hiccups for ~8 years. Has had a workup in [place] son unsure of findings but was concerned patient had cancer. Son will speak with his brother is located [place] and have the father request medical records from [place].    Assessment & Plan:   Active Problems:   Upper GI bleed   Upper GI bleed/esophageal ulcer -EGD completed again shows gastric erosions, esophageal ulcer see results below  -Pepcid 40 mg daily -Protonix 40 mg BID -Carafate 1 g TID  -Monitor H/H   Recent Labs Lab 03/03/17 2011 03/03/17 2050 03/04/17 0047 03/04/17 0715 03/04/17 1807  HGB 10.0* 11.9* 9.2* 8.0* 8.3*  -relatively stable. -Transfuse for hemoglobin<7  Cardiomegaly -Obtain Echocardiogram  SVT -Currently in NSR   -EKG pending  CVA -History CVA, no previous records to verify. -Hold aspirin/Plavix secondary GI bleed  Intractable Hiccups -Thorazine IV 25 mg 1     DVT prophylaxis: SCD Code  Status: Full Family Communication: Spoke with wife and son from [place]. Son from [place] concerned that father may have cancer? Disposition Plan:    Consultants:  GI    Procedures/Significant Events:  9/7 EGD:-Bleeding esophageal ulcer, biopsied-medium-sized hiatal hernia,-gastric erosions without bleeding secondary to NG tube suction trauma   I have personally reviewed and interpreted all radiology studies and my findings are as above.  VENTILATOR SETTINGS:    Cultures 9/7 GI biopsy pending   Antimicrobials: None   Devices None   LINES / TUBES:  None    Continuous Infusions: . dextrose 5 % and 0.9% NaCl 100 mL/hr at 03/04/17 0053  . pantoprozole (PROTONIX) infusion 8 mg/hr (03/04/17 0106)     Objective: Vitals:   03/04/17 0435 03/04/17 0535 03/04/17 0635 03/04/17 0802  BP: (!) 134/93 136/80 (!) 117/94 139/80  Pulse: (!) 101 92 (!) 116 85  Resp: 14 14 19 13   Temp:    98.1 F (36.7 C)  TempSrc:    Oral  SpO2: 100% 98% 100% 100%  Weight:      Height:        Intake/Output Summary (Last 24 hours) at 03/04/17 0932 Last data filed at 03/04/17 0900  Gross per 24 hour  Intake          2884.17 ml  Output              250 ml  Net          2634.17 ml   Filed Weights   03/03/17  2005  Weight: 145 lb (65.8 kg)    Examination:  General: A/O 4, No acute respiratory distress Neck:  Negative scars, masses, torticollis, lymphadenopathy, JVD Lungs: Clear to auscultation bilaterally without wheezes or crackles Cardiovascular: Regular rate and rhythm without murmur gallop or rub normal S1 and S2 Abdomen: negative abdominal pain, nondistended, positive soft, bowel sounds, no rebound, no ascites, no appreciable mass Extremities: No significant cyanosis, clubbing, or edema bilateral lower extremities Skin: Negative rashes, lesions, ulcers Psychiatric:  Negative depression, negative anxiety, negative fatigue, negative mania  Central nervous system:  Cranial  nerves II through XII intact, tongue/uvula midline, all extremities muscle strength 5/5, sensation intact throughout, negative dysarthria, negative expressive aphasia, negative receptive aphasia.  .     Data Reviewed: Care during the described time interval was provided by me .  I have reviewed this patient's available data, including medical history, events of note, physical examination, and all test results as part of my evaluation.   CBC:  Recent Labs Lab 03/03/17 2011 03/03/17 2050 03/04/17 0047 03/04/17 0715  WBC 17.2*  --   --   --   HGB 10.0* 11.9* 9.2* 8.0*  HCT 33.3* 35.0* 30.1* 26.8*  MCV 72.4*  --   --   --   PLT 401*  --   --   --    Basic Metabolic Panel:  Recent Labs Lab 03/03/17 2011 03/03/17 2050 03/04/17 0715  NA 134* 136 139  K 4.3 4.2 3.8  CL 100* 100* 108  CO2 20*  --  23  GLUCOSE 278* 275* 133*  BUN 43* 41* 28*  CREATININE 1.68* 1.60* 1.26*  CALCIUM 9.9  --  8.3*   GFR: Estimated Creatinine Clearance: 44.2 mL/min (A) (by C-G formula based on SCr of 1.26 mg/dL (H)). Liver Function Tests:  Recent Labs Lab 03/03/17 2011 03/04/17 0715  AST 22 18  ALT 12* 10*  ALKPHOS 83 63  BILITOT 0.5 0.5  PROT 7.4 6.0*  ALBUMIN 3.8 3.0*    Recent Labs Lab 03/03/17 2011  LIPASE 29   No results for input(s): AMMONIA in the last 168 hours. Coagulation Profile:  Recent Labs Lab 03/04/17 0047  INR 1.13   Cardiac Enzymes: No results for input(s): CKTOTAL, CKMB, CKMBINDEX, TROPONINI in the last 168 hours. BNP (last 3 results) No results for input(s): PROBNP in the last 8760 hours. HbA1C: No results for input(s): HGBA1C in the last 72 hours. CBG:  Recent Labs Lab 03/03/17 2014 03/04/17 0804  GLUCAP 290* 137*   Lipid Profile: No results for input(s): CHOL, HDL, LDLCALC, TRIG, CHOLHDL, LDLDIRECT in the last 72 hours. Thyroid Function Tests: No results for input(s): TSH, T4TOTAL, FREET4, T3FREE, THYROIDAB in the last 72 hours. Anemia  Panel: No results for input(s): VITAMINB12, FOLATE, FERRITIN, TIBC, IRON, RETICCTPCT in the last 72 hours. Urine analysis:    Component Value Date/Time   COLORURINE YELLOW 11/26/2016 1430   APPEARANCEUR CLEAR 11/26/2016 1430   LABSPEC 1.017 11/26/2016 1430   PHURINE 6.0 11/26/2016 1430   GLUCOSEU NEGATIVE 11/26/2016 1430   HGBUR NEGATIVE 11/26/2016 1430   BILIRUBINUR NEGATIVE 11/26/2016 1430   KETONESUR NEGATIVE 11/26/2016 1430   PROTEINUR NEGATIVE 11/26/2016 1430   NITRITE NEGATIVE 11/26/2016 1430   LEUKOCYTESUR TRACE (A) 11/26/2016 1430   Sepsis Labs: @LABRCNTIP (procalcitonin:4,lacticidven:4)  ) Recent Results (from the past 240 hour(s))  MRSA PCR Screening     Status: None   Collection Time: 03/04/17 12:48 AM  Result Value Ref Range Status   MRSA by  PCR NEGATIVE NEGATIVE Final    Comment:        The GeneXpert MRSA Assay (FDA approved for NASAL specimens only), is one component of a comprehensive MRSA colonization surveillance program. It is not intended to diagnose MRSA infection nor to guide or monitor treatment for MRSA infections.          Radiology Studies: US Renal  Result Date: 03/04/2017 CLINICAL DATA:  Acute kidney injury. EXAM: RENAL / URINARY TRACT ULTRASOUND COMPLETE COMPARISON:  CT 11/26/2016 FINDINGS: Right Kidney: Length: 9.8 cm. Kidney is partially obscured by adjacent rib shadowing. Echogenicity grossly within normal limits. No hydronephrosis. No visualized mass. Left Kidney: Length: 9.4 cm. Echogenicity within normal limits. No hydronephrosis. Small cyst on prior exam is not well seen sonographically. Bladder: Appears normal for degree of bladder distention. IMPRESSION: Unremarkable renal ultrasound. Electronically Signed   By: Jeb Levering M.D.   On: 03/04/2017 00:20   Dg Chest Port 1 View  Result Date: 03/03/2017 CLINICAL DATA:  Hematemesis.  Weakness. EXAM: PORTABLE CHEST 1 VIEW COMPARISON:  Remote radiographs 07/07/2006 FINDINGS: Heart size  upper normal. Atherosclerosis and tortuosity of the thoracic aorta. No pulmonary edema. No consolidation, pleural fluid or pneumothorax. Remote left rib fractures. IMPRESSION: 1. No acute chest finding. 2. Tortuous atherosclerotic thoracic aorta. Electronically Signed   By: Jeb Levering M.D.   On: 03/03/2017 21:04        Scheduled Meds: . [START ON 03/07/2017] pantoprazole  40 mg Intravenous Q12H   Continuous Infusions: . dextrose 5 % and 0.9% NaCl 100 mL/hr at 03/04/17 0053  . pantoprozole (PROTONIX) infusion 8 mg/hr (03/04/17 0106)     LOS: 1 day    Time spent: 40 minutes    Arlando Leisinger, Geraldo Docker, MD [facility] Pager [phone]   If 7PM-7AM, please contact night-coverage [email or web address] Password [facility] 03/04/2017, 9:32 AM

## 2017-03-04 NOTE — Telephone Encounter (Signed)
-----   Message from Vena Rua, PA-C sent at 03/04/2017  3:07 PM EDT ----- Regarding: arrange neuro referral Hi Sherri.  Can you arrange [facility] neuro referral for pt's hiccups? Dr Delane Ginger is thinking these are likely due to central nervous system cause after his CVA.   Dr Deliah Boston might be best for him, as she likely speaks a language which he and his wife understand.  [place]jar[place] is his primary language but he and wife also speak some Hindi.   Thanks, Judson Roch  PS He has f/up with PA Anderson Malta on 9/27, check with Dr Carlean Purl to see if he would rather squeeze him into one of his "secret" slots.    Sarah.

## 2017-03-04 NOTE — Anesthesia Procedure Notes (Signed)
Procedure Name: MAC Performed by: Valda Favia Pre-anesthesia Checklist: Patient identified, Emergency Drugs available, Suction available, Patient being monitored and Timeout performed Patient Re-evaluated:Patient Re-evaluated prior to induction Oxygen Delivery Method: Nasal cannula Airway Equipment and Method: Bite block Placement Confirmation: positive ETCO2 Dental Injury: Teeth and Oropharynx as per pre-operative assessment

## 2017-03-05 ENCOUNTER — Inpatient Hospital Stay (HOSPITAL_COMMUNITY): Payer: Medicare Other

## 2017-03-05 DIAGNOSIS — K2970 Gastritis, unspecified, without bleeding: Secondary | ICD-10-CM

## 2017-03-05 DIAGNOSIS — I517 Cardiomegaly: Secondary | ICD-10-CM

## 2017-03-05 DIAGNOSIS — I5032 Chronic diastolic (congestive) heart failure: Secondary | ICD-10-CM

## 2017-03-05 DIAGNOSIS — B9681 Helicobacter pylori [H. pylori] as the cause of diseases classified elsewhere: Secondary | ICD-10-CM

## 2017-03-05 LAB — BASIC METABOLIC PANEL
ANION GAP: 6 (ref 5–15)
BUN: 17 mg/dL (ref 6–20)
CO2: 23 mmol/L (ref 22–32)
Calcium: 8.2 mg/dL — ABNORMAL LOW (ref 8.9–10.3)
Chloride: 108 mmol/L (ref 101–111)
Creatinine, Ser: 1.28 mg/dL — ABNORMAL HIGH (ref 0.61–1.24)
GFR, EST AFRICAN AMERICAN: 60 mL/min — AB (ref >60)
GFR, EST NON AFRICAN AMERICAN: 52 mL/min — AB (ref >60)
GLUCOSE: 114 mg/dL — AB (ref 65–99)
POTASSIUM: 3.8 mmol/L (ref 3.5–5.1)
Sodium: 137 mmol/L (ref 135–145)

## 2017-03-05 LAB — HEMOGLOBIN AND HEMATOCRIT, BLOOD
HCT: 24.5 % — ABNORMAL LOW (ref 39.0–52.0)
HEMATOCRIT: 25.8 % — AB (ref 39.0–52.0)
HEMOGLOBIN: 7.9 g/dL — AB (ref 13.0–17.0)
Hemoglobin: 7.2 g/dL — ABNORMAL LOW (ref 13.0–17.0)

## 2017-03-05 LAB — URINALYSIS, ROUTINE W REFLEX MICROSCOPIC
BILIRUBIN URINE: NEGATIVE
Glucose, UA: NEGATIVE mg/dL
Hgb urine dipstick: NEGATIVE
Ketones, ur: NEGATIVE mg/dL
Leukocytes, UA: NEGATIVE
NITRITE: NEGATIVE
PROTEIN: NEGATIVE mg/dL
SPECIFIC GRAVITY, URINE: 1.006 (ref 1.005–1.030)
pH: 7 (ref 5.0–8.0)

## 2017-03-05 LAB — PREPARE RBC (CROSSMATCH)

## 2017-03-05 LAB — GLUCOSE, CAPILLARY
GLUCOSE-CAPILLARY: 124 mg/dL — AB (ref 65–99)
Glucose-Capillary: 137 mg/dL — ABNORMAL HIGH (ref 65–99)

## 2017-03-05 LAB — ECHOCARDIOGRAM COMPLETE
Height: 67 in
Weight: 2320 oz

## 2017-03-05 LAB — MAGNESIUM: MAGNESIUM: 1.7 mg/dL (ref 1.7–2.4)

## 2017-03-05 MED ORDER — SODIUM CHLORIDE 0.9 % IV SOLN
Freq: Once | INTRAVENOUS | Status: AC
  Administered 2017-03-05: 19:00:00 via INTRAVENOUS

## 2017-03-05 MED ORDER — AMOXICILLIN 500 MG PO CAPS
1000.0000 mg | ORAL_CAPSULE | Freq: Two times a day (BID) | ORAL | Status: DC
  Administered 2017-03-05 – 2017-03-06 (×3): 1000 mg via ORAL
  Filled 2017-03-05 (×3): qty 2

## 2017-03-05 MED ORDER — CLARITHROMYCIN 500 MG PO TABS
500.0000 mg | ORAL_TABLET | Freq: Two times a day (BID) | ORAL | Status: DC
  Administered 2017-03-05 – 2017-03-06 (×3): 500 mg via ORAL
  Filled 2017-03-05 (×3): qty 1

## 2017-03-05 NOTE — Progress Notes (Signed)
Patient's granddaughter translated for patient on the need for one unit of blood. Patient agrees and says he has had it one time before. Consent signed and blood started.

## 2017-03-05 NOTE — Progress Notes (Signed)
PROGRESS NOTE    Logan [name]  JKD:[id number] DOB: 08-Jul-1934 DOA: 03/03/2017 PCP: Allie Bossier, MD   Brief Narrative:  82 yo  does not speak English from [place] PMHx;  CVA, Gastritis, Hiatal Hernia, HLD, Anemia   History taken was challenging due to language barrier and grandson is not fully aware of medical history. Pt said he has been vomiting "black stuff" all day but denied any red blood or hematochezia. He has mild cough but denied hemoptysis. No abdominal pain. No dyspnea/chest pain. No fever/chills. He reported feeling fatigue and tired. No other complaints. He had EGDs before, last one was done in June and showed esophagitis and hiatal hernia but otherwise normal.  Report of EGD form June: - LA GRADE A ESOPHAGITIS . - 6 cm hiatal hernia . -Examination otherwise normal. - Biopsies taken with cold forceps for H pylori testing using CLOtestt.    Subjective: 9/8 A/O 4, negative CP, negative SOB, hiccups have resolved. Negative N/V negative abdominal pain.     Assessment & Plan:   Active Problems:   Hematemesis   Upper GI bleed/Esophageal ulcer -EGD: Gastric erosions, esophageal ulcer see results below  -Pepcid 40 mg daily -Protonix 40 mg BID -Carafate 1 g TID  -Monitor H/H   Recent Labs Lab 03/03/17 2011 03/03/17 2050 03/04/17 0047 03/04/17 0715 03/04/17 1807 03/05/17 0619  HGB 10.0* 11.9* 9.2* 8.0* 8.3* 7.2*  -Dropped overnight.  Positive H. pylori. Gastritis - Start Clarithromycin 500 mg BID + Amoxicillin 1 g BID 14 days -Continue Protonix  Cardiomegaly/Chronic Diastolic CHF -9/8 Echocardiogram: Consistent with diastolic dysfunction see results below -9/7 EKG: Abnormal nonspecific ST-T wave changes in inferior leads. Patient asymptomatic but may require stress test after recovery from GI bleed. Will discuss with cardiology on 9/9 -Transfuse for hemoglobin<8 -9/8 transfuse 1 unit PRBC  SVT -Currently in NSR    CVA -History CVA, no previous  records to verify. -Hold aspirin/Plavix secondary GI bleed  Intractable Hiccups -Resolved with 1 dose Thorazine      DVT prophylaxis: SCD Code Status: Full Family Communication: Spoke with granddaughter and other family members, aware of plan of care. Primary POC son Roosvelt Churchwell; [phone], secondary POC granddaughter Jasim Harari; [phone]    Disposition Plan: Discharge next 24-48 hours once hemoglobin stable   Consultants:  GI    Procedures/Significant Events:  9/7 EGD:-Bleeding esophageal ulcer, biopsied-medium-sized hiatal hernia,-gastric erosions without bleeding secondary to NG tube suction trauma   I have personally reviewed and interpreted all radiology studies and my findings are as above.  VENTILATOR SETTINGS:    Cultures 9/7 Stomach Antrum Biopsy: Gastric antral and body mucosa demonstrated chronic gastritis positive H. pylori. Negative intestinal metaplasia, dysplasia or malignancy identified 9/7 Stomach Proximal Biopsy gastric body mucosa demonstrated chronic active gastritis with positive H. pylori. Negative intestinal metaplasia or malignancy identified   Antimicrobials: None   Devices None   LINES / TUBES:  None    Continuous Infusions: . dextrose 5 % and 0.9% NaCl 100 mL/hr at 03/05/17 1600     Objective: Vitals:   03/05/17 0306 03/05/17 0430 03/05/17 0750 03/05/17 1518  BP: (!) 108/58 98/66 114/80 (!) 143/68  Pulse: 100 70 96 93  Resp: 14 16 13 19   Temp: 98 F (36.7 C) 97.9 F (36.6 C) 98.5 F (36.9 C)   TempSrc:  Oral Oral   SpO2:  96% 96% 98%  Weight:      Height:        Intake/Output  Summary (Last 24 hours) at 03/05/17 1707 Last data filed at 03/05/17 1301  Gross per 24 hour  Intake             2715 ml  Output             1175 ml  Net             1540 ml   Filed Weights   03/03/17 2005  Weight: 145 lb (65.8 kg)   Physical Exam:  General: A/O 4, No acute respiratory distress Neck:  Negative scars, masses,  torticollis, lymphadenopathy, JVD Lungs: Clear to auscultation bilaterally without wheezes or crackles Cardiovascular: Regular rate and rhythm without murmur gallop or rub normal S1 and S2 Abdomen: negative abdominal pain, nondistended, positive soft, bowel sounds, no rebound, no ascites, no appreciable mass, hiccups resolved Extremities: No significant cyanosis, clubbing, or edema bilateral lower extremities Psychiatric:  Negative depression, negative anxiety, negative fatigue, negative mania  Central nervous system:  Cranial nerves II through XII intact, tongue/uvula midline, all extremities muscle strength 5/5, sensation intact throughout, negative dysarthria, negative expressive aphasia, negative receptive aphasia.  .     Data Reviewed: Care during the described time interval was provided by me .  I have reviewed this patient's available data, including medical history, events of note, physical examination, and all test results as part of my evaluation.   CBC:  Recent Labs Lab 03/03/17 2011 03/03/17 2050 03/04/17 0047 03/04/17 0715 03/04/17 1807 03/05/17 0619  WBC 17.2*  --   --   --   --   --   HGB 10.0* 11.9* 9.2* 8.0* 8.3* 7.2*  HCT 33.3* 35.0* 30.1* 26.8* 27.7* 24.5*  MCV 72.4*  --   --   --   --   --   PLT 401*  --   --   --   --   --    Basic Metabolic Panel:  Recent Labs Lab 03/03/17 2011 03/03/17 2050 03/04/17 0715 03/05/17 0619  NA 134* 136 139 137  K 4.3 4.2 3.8 3.8  CL 100* 100* 108 108  CO2 20*  --  23 23  GLUCOSE 278* 275* 133* 114*  BUN 43* 41* 28* 17  CREATININE 1.68* 1.60* 1.26* 1.28*  CALCIUM 9.9  --  8.3* 8.2*  MG  --   --   --  1.7   GFR: Estimated Creatinine Clearance: 43.6 mL/min (A) (by C-G formula based on SCr of 1.28 mg/dL (H)). Liver Function Tests:  Recent Labs Lab 03/03/17 2011 03/04/17 0715  AST 22 18  ALT 12* 10*  ALKPHOS 83 63  BILITOT 0.5 0.5  PROT 7.4 6.0*  ALBUMIN 3.8 3.0*    Recent Labs Lab 03/03/17 2011  LIPASE  29   No results for input(s): AMMONIA in the last 168 hours. Coagulation Profile:  Recent Labs Lab 03/04/17 0047  INR 1.13   Cardiac Enzymes: No results for input(s): CKTOTAL, CKMB, CKMBINDEX, TROPONINI in the last 168 hours. BNP (last 3 results) No results for input(s): PROBNP in the last 8760 hours. HbA1C: No results for input(s): HGBA1C in the last 72 hours. CBG:  Recent Labs Lab 03/04/17 0804 03/04/17 1053 03/04/17 1607 03/05/17 0755 03/05/17 1143  GLUCAP 137* 132* 144* 124* 137*   Lipid Profile: No results for input(s): CHOL, HDL, LDLCALC, TRIG, CHOLHDL, LDLDIRECT in the last 72 hours. Thyroid Function Tests: No results for input(s): TSH, T4TOTAL, FREET4, T3FREE, THYROIDAB in the last 72 hours. Anemia Panel: No results for input(s):  VITAMINB12, FOLATE, FERRITIN, TIBC, IRON, RETICCTPCT in the last 72 hours. Urine analysis:    Component Value Date/Time   COLORURINE COLORLESS (A) 03/04/2017 0017   APPEARANCEUR CLEAR 03/04/2017 0017   LABSPEC 1.006 03/04/2017 0017   PHURINE 7.0 03/04/2017 0017   GLUCOSEU NEGATIVE 03/04/2017 0017   HGBUR NEGATIVE 03/04/2017 0017   BILIRUBINUR NEGATIVE 03/04/2017 0017   KETONESUR NEGATIVE 03/04/2017 0017   PROTEINUR NEGATIVE 03/04/2017 0017   NITRITE NEGATIVE 03/04/2017 0017   LEUKOCYTESUR NEGATIVE 03/04/2017 0017   Sepsis Labs: @LABRCNTIP (procalcitonin:4,lacticidven:4)  ) Recent Results (from the past 240 hour(s))  MRSA PCR Screening     Status: None   Collection Time: 03/04/17 12:48 AM  Result Value Ref Range Status   MRSA by PCR NEGATIVE NEGATIVE Final    Comment:        The GeneXpert MRSA Assay (FDA approved for NASAL specimens only), is one component of a comprehensive MRSA colonization surveillance program. It is not intended to diagnose MRSA infection nor to guide or monitor treatment for MRSA infections.          Radiology Studies: US Renal  Result Date: 03/04/2017 CLINICAL DATA:  Acute kidney injury.  EXAM: RENAL / URINARY TRACT ULTRASOUND COMPLETE COMPARISON:  CT 11/26/2016 FINDINGS: Right Kidney: Length: 9.8 cm. Kidney is partially obscured by adjacent rib shadowing. Echogenicity grossly within normal limits. No hydronephrosis. No visualized mass. Left Kidney: Length: 9.4 cm. Echogenicity within normal limits. No hydronephrosis. Small cyst on prior exam is not well seen sonographically. Bladder: Appears normal for degree of bladder distention. IMPRESSION: Unremarkable renal ultrasound. Electronically Signed   By: Jeb Levering M.D.   On: 03/04/2017 00:20   Dg Chest Port 1 View  Result Date: 03/03/2017 CLINICAL DATA:  Hematemesis.  Weakness. EXAM: PORTABLE CHEST 1 VIEW COMPARISON:  Remote radiographs 07/07/2006 FINDINGS: Heart size upper normal. Atherosclerosis and tortuosity of the thoracic aorta. No pulmonary edema. No consolidation, pleural fluid or pneumothorax. Remote left rib fractures. IMPRESSION: 1. No acute chest finding. 2. Tortuous atherosclerotic thoracic aorta. Electronically Signed   By: Jeb Levering M.D.   On: 03/03/2017 21:04        Scheduled Meds: . famotidine  40 mg Oral QHS  . [START ON 03/07/2017] pantoprazole  40 mg Intravenous Q12H  . sucralfate  1 g Oral TID WC & HS   Continuous Infusions: . dextrose 5 % and 0.9% NaCl 100 mL/hr at 03/05/17 1600     LOS: 2 days    Time spent: 40 minutes    Eliyas Suddreth, Geraldo Docker, MD [facility] Pager [phone]   If 7PM-7AM, please contact night-coverage [email or web address] Password [email or web address] 03/05/2017, 5:07 PM

## 2017-03-05 NOTE — Progress Notes (Signed)
Patient NSR to ST on tele. Room air. No complaints of pain this shift. Patient with continuous hiccups and nausea. Zofran administered per MAR. After dose of IV thorazine, patient was restful with out hiccups or nausea/vomiting. Overall restful evening without obvious signs of bleeding.   Patients son signed medical records request for [place] hospital. Medical records at receiving facility dose not open until Monday at [facility]. Will need to fax request on Monday 03/07/17.

## 2017-03-05 NOTE — Progress Notes (Signed)
  Echocardiogram 2D Echocardiogram has been performed.  Logan [name] 03/05/2017, 11:22 AM

## 2017-03-05 NOTE — Progress Notes (Signed)
Subjective: No complaints.    Objective: Vital signs in last 24 hours: Temp:  [97.9 F (36.6 C)-98.5 F (36.9 C)] 98.5 F (36.9 C) (09/08 0750) Pulse Rate:  [70-111] 96 (09/08 0750) Resp:  [13-16] 13 (09/08 0750) BP: (89-153)/(58-91) 114/80 (09/08 0750) SpO2:  [96 %-100 %] 96 % (09/08 0750)    Intake/Output from previous day: 09/07 0701 - 09/08 0700 In: 3195 [P.O.:680; I.V.:2515] Out: 1075 [Urine:1075] Intake/Output this shift: Total I/O In: -  Out: 650 [Urine:650]  General appearance: alert and no distress GI: soft, non-tender; bowel sounds normal; no masses,  no organomegaly  Lab Results:  Recent Labs  03/03/17 2011  03/04/17 0715 03/04/17 1807 03/05/17 0619  WBC 17.2*  --   --   --   --   HGB 10.0*  < > 8.0* 8.3* 7.2*  HCT 33.3*  < > 26.8* 27.7* 24.5*  PLT 401*  --   --   --   --   < > = values in this interval not displayed. BMET  Recent Labs  03/03/17 2011 03/03/17 2050 03/04/17 0715 03/05/17 0619  NA 134* 136 139 137  K 4.3 4.2 3.8 3.8  CL 100* 100* 108 108  CO2 20*  --  23 23  GLUCOSE 278* 275* 133* 114*  BUN 43* 41* 28* 17  CREATININE 1.68* 1.60* 1.26* 1.28*  CALCIUM 9.9  --  8.3* 8.2*   LFT  Recent Labs  03/04/17 0715  PROT 6.0*  ALBUMIN 3.0*  AST 18  ALT 10*  ALKPHOS 63  BILITOT 0.5   PT/INR  Recent Labs  03/04/17 0047  LABPROT 14.4  INR 1.13   Hepatitis Panel No results for input(s): HEPBSAG, HCVAB, HEPAIGM, HEPBIGM in the last 72 hours. C-Diff No results for input(s): CDIFFTOX in the last 72 hours. Fecal Lactopherrin No results for input(s): FECLLACTOFRN in the last 72 hours.  Studies/Results: US Renal  Result Date: 03/04/2017 CLINICAL DATA:  Acute kidney injury. EXAM: RENAL / URINARY TRACT ULTRASOUND COMPLETE COMPARISON:  CT 11/26/2016 FINDINGS: Right Kidney: Length: 9.8 cm. Kidney is partially obscured by adjacent rib shadowing. Echogenicity grossly within normal limits. No hydronephrosis. No visualized mass. Left  Kidney: Length: 9.4 cm. Echogenicity within normal limits. No hydronephrosis. Small cyst on prior exam is not well seen sonographically. Bladder: Appears normal for degree of bladder distention. IMPRESSION: Unremarkable renal ultrasound. Electronically Signed   By: Jeb Levering M.D.   On: 03/04/2017 00:20   Dg Chest Port 1 View  Result Date: 03/03/2017 CLINICAL DATA:  Hematemesis.  Weakness. EXAM: PORTABLE CHEST 1 VIEW COMPARISON:  Remote radiographs 07/07/2006 FINDINGS: Heart size upper normal. Atherosclerosis and tortuosity of the thoracic aorta. No pulmonary edema. No consolidation, pleural fluid or pneumothorax. Remote left rib fractures. IMPRESSION: 1. No acute chest finding. 2. Tortuous atherosclerotic thoracic aorta. Electronically Signed   By: Jeb Levering M.D.   On: 03/03/2017 21:04    Medications:  Scheduled: . famotidine  40 mg Oral QHS  . [START ON 03/07/2017] pantoprazole  40 mg Intravenous Q12H  . sucralfate  1 g Oral TID WC & HS   Continuous: . dextrose 5 % and 0.9% NaCl 100 mL/hr at 03/05/17 0730    Assessment/Plan: 1) LA Grade D esophagitis. 2) Anemia.   There was a mild drop in his HGB.  Before discharge it will be prudent to ensure stability or an increase in his HGB.  Plan: 1) Continue with pantoprazole BID. 2) Continue with sucralfate. 3) Follow HGB and  transfuse if necessary.  LOS: 2 days   Loreley Schwall D 03/05/2017, 1:58 PM

## 2017-03-06 ENCOUNTER — Encounter (HOSPITAL_COMMUNITY): Payer: Self-pay | Admitting: Gastroenterology

## 2017-03-06 DIAGNOSIS — I5032 Chronic diastolic (congestive) heart failure: Secondary | ICD-10-CM

## 2017-03-06 DIAGNOSIS — I471 Supraventricular tachycardia: Secondary | ICD-10-CM

## 2017-03-06 DIAGNOSIS — B9681 Helicobacter pylori [H. pylori] as the cause of diseases classified elsewhere: Secondary | ICD-10-CM

## 2017-03-06 DIAGNOSIS — K2211 Ulcer of esophagus with bleeding: Secondary | ICD-10-CM

## 2017-03-06 DIAGNOSIS — K2970 Gastritis, unspecified, without bleeding: Secondary | ICD-10-CM

## 2017-03-06 DIAGNOSIS — K922 Gastrointestinal hemorrhage, unspecified: Secondary | ICD-10-CM

## 2017-03-06 DIAGNOSIS — I517 Cardiomegaly: Secondary | ICD-10-CM

## 2017-03-06 DIAGNOSIS — R066 Hiccough: Secondary | ICD-10-CM

## 2017-03-06 DIAGNOSIS — I639 Cerebral infarction, unspecified: Secondary | ICD-10-CM

## 2017-03-06 LAB — BPAM RBC
BLOOD PRODUCT EXPIRATION DATE: 201809192359
ISSUE DATE / TIME: 201809081842
UNIT TYPE AND RH: 7300

## 2017-03-06 LAB — GLUCOSE, CAPILLARY: Glucose-Capillary: 109 mg/dL — ABNORMAL HIGH (ref 65–99)

## 2017-03-06 LAB — BASIC METABOLIC PANEL
Anion gap: 6 (ref 5–15)
BUN: 12 mg/dL (ref 6–20)
CO2: 24 mmol/L (ref 22–32)
CREATININE: 1.2 mg/dL (ref 0.61–1.24)
Calcium: 8.1 mg/dL — ABNORMAL LOW (ref 8.9–10.3)
Chloride: 107 mmol/L (ref 101–111)
GFR calc Af Amer: >60 mL/min (ref >60)
GFR, EST NON AFRICAN AMERICAN: 56 mL/min — AB (ref >60)
Glucose, Bld: 125 mg/dL — ABNORMAL HIGH (ref 65–99)
Potassium: 3.7 mmol/L (ref 3.5–5.1)
SODIUM: 137 mmol/L (ref 135–145)

## 2017-03-06 LAB — HEMOGLOBIN AND HEMATOCRIT, BLOOD
HCT: 29.9 % — ABNORMAL LOW (ref 39.0–52.0)
HEMATOCRIT: 29.2 % — AB (ref 39.0–52.0)
Hemoglobin: 8.8 g/dL — ABNORMAL LOW (ref 13.0–17.0)
Hemoglobin: 9.1 g/dL — ABNORMAL LOW (ref 13.0–17.0)

## 2017-03-06 LAB — TYPE AND SCREEN
ABO/RH(D): B POS
ANTIBODY SCREEN: NEGATIVE
Unit division: 0

## 2017-03-06 LAB — MAGNESIUM: MAGNESIUM: 1.7 mg/dL (ref 1.7–2.4)

## 2017-03-06 MED ORDER — PANTOPRAZOLE SODIUM 40 MG PO TBEC: 40.0000 mg | DELAYED_RELEASE_TABLET | Freq: Two times a day (BID) | ORAL | 0 refills | Status: DC

## 2017-03-06 MED ORDER — FAMOTIDINE 40 MG PO TABS: 40.0000 mg | ORAL_TABLET | Freq: Every day | ORAL | 0 refills | Status: DC

## 2017-03-06 MED ORDER — SUCRALFATE 1 G PO TABS: 1.0000 g | ORAL_TABLET | Freq: Three times a day (TID) | ORAL | 0 refills | Status: DC

## 2017-03-06 MED ORDER — BACLOFEN 5 MG PO TABS: 5.0000 mg | ORAL_TABLET | Freq: Three times a day (TID) | ORAL | 0 refills | Status: DC

## 2017-03-06 MED ORDER — BACLOFEN 10 MG PO TABS
5.0000 mg | ORAL_TABLET | Freq: Three times a day (TID) | ORAL | Status: DC
  Administered 2017-03-06 (×3): 5 mg via ORAL
  Filled 2017-03-06 (×3): qty 1

## 2017-03-06 MED ORDER — AMOXICILLIN 500 MG PO CAPS: 1000.0000 mg | ORAL_CAPSULE | Freq: Two times a day (BID) | ORAL | 0 refills | Status: DC

## 2017-03-06 MED ORDER — CLARITHROMYCIN 500 MG PO TABS: 500.0000 mg | ORAL_TABLET | Freq: Two times a day (BID) | ORAL | 0 refills | Status: DC

## 2017-03-06 NOTE — Discharge Summary (Signed)
Physician Discharge Summary  Logan [name] TWS:[id number] DOB: 06-Jul-1934 DOA: 03/03/2017  PCP: Allie Bossier, MD  Admit date: 03/03/2017 Discharge date: 03/06/2017  Time spent:35 minutes  Recommendations for Outpatient Follow-up:  Upper GI bleed/Esophageal ulcer with bleeding -EGD: Gastric erosions, esophageal ulcer see results below  -Pepcid 40 mg  daily -Protonix 40 mg  BID -Carafate 1 g TID  -Monitor H/H    Recent Labs Lab 03/04/17 0715 03/04/17 1807 03/05/17 0619 03/05/17 1637 03/06/17 0211 03/06/17 1616  HGB 8.0* 8.3* 7.2* 7.9* 8.8* 9.1*  -Stable per Dr. Carol Ada [facility]. Patient stable for discharge and should follow-up in one to 2 weeks. - Schedule follow-up 9/27 @10 :15 with PA  Lavone Nian Lemmon/Dr. Carlean Purl GI; upper GI bleed, positive H pylori gastritis.   Positive H. pylori. Gastritis - Clarithromycin 500 mg BID + amoxicillin 1 g BID 14 days -Protonix   Cardiomegaly/Chronic Diastolic CHF -9/6 troponin negative, Asymptomatic -9/7 EKG: Abnormal nonspecific ST-T wave changes in inferior leads. Patient asymptomatic but may require stress test after recovery from GI bleed. -9/8 Echocardiogram: Consistent with diastolic dysfunction see results below -Schedule establish care in 1-2 weeks with [facility], chronic diastolic CHF, risk stratification (stress test?)   SVT -Currently in NSR     CVA -History CVA, no previous records to verify. -Hold aspirin/Plavix secondary GI bleed   Intractable Hiccups -Start Baclofen per GI.   Deconditioning -Consult to social work for wheelchair -Wheelchair ordered    Discharge Diagnoses:  Active Problems:   Hematemesis   Upper GI bleed   Esophageal ulcer with bleeding   Helicobacter pylori gastritis   Cardiomegaly   Chronic diastolic CHF (congestive heart failure) (HCC)   Intractable hiccups   Cerebrovascular accident (CVA) ([place])   SVT (supraventricular tachycardia) ([place])   Discharge Condition:  Stable  Diet recommendation: Vegetarian  Filed Weights   03/03/17 2005  Weight: 145 lb (65.8 kg)    History of present illness:  82 yo  does not speak [place] from [place] PMHx;  CVA, Gastritis, Hiatal Hernia, HLD, Anemia    History taken was challenging due to language barrier and grandson is not fully aware of medical history. Pt said he has been vomiting "black stuff" all day but denied any red blood or hematochezia. He has mild cough but denied hemoptysis. No abdominal pain. No dyspnea/chest pain. No fever/chills. He reported feeling fatigue and tired. No other complaints. He had EGDs before, last one was done in June and showed esophagitis and hiatal hernia but otherwise normal.  Report of EGD form June: - LA GRADE A ESOPHAGITIS . - 6 cm hiatal hernia . -Examination otherwise normal. - Biopsies taken with cold forceps for H pylori testing using CLOtestt.  During his hospitalization patient was diagnosed with upper GI bleed. EGD was performed which showed gastric erosions without bleeding, bleeding esophageal ulcers. Biopsy showed patient positive for H. pylori. Patient was started on appropriate antibiotics, PPI, Carafate. During his hospitalization patient received 1 unit PRBC, and H/H has been stable. Per GI patient is to follow-up in one to 2 weeks.    Procedures: 9/7 EGD:-Bleeding esophageal ulcer, biopsied-medium-sized hiatal hernia,-gastric erosions without bleeding secondary to NG tube suction trauma    Consultations: [facility]    Cultures   9/7 Stomach Antrum Biopsy: Gastric antral and body mucosa demonstrated chronic gastritis positive H. pylori. Negative intestinal metaplasia, dysplasia or malignancy identified 9/7 Stomach Proximal Biopsy gastric body mucosa demonstrated chronic active gastritis with positive H. pylori. Negative intestinal metaplasia or malignancy  identified    Antibiotics Anti-infectives    Start     Stop   03/05/17 2000  clarithromycin  (BIAXIN) tablet 500 mg     03/20/17 2359   03/05/17 2000  amoxicillin (AMOXIL) capsule 1,000 mg     03/20/17 2359      Discharge Exam: Vitals:   03/05/17 2200 03/06/17 0307 03/06/17 0424 03/06/17 0954  BP: 136/88 111/76 108/64 125/85  Pulse: 100 81 79 (!) 117  Resp: 18 18 17 17   Temp: 98 F (36.7 C)  98.4 F (36.9 C) 98.6 F (37 C)  TempSrc:   Oral Oral  SpO2: 98% 98% 98% 96%  Weight:      Height:        General: A/O 4, No acute respiratory distress Neck:  Negative scars, masses, torticollis, lymphadenopathy, JVD Lungs: Clear to auscultation bilaterally without wheezes or crackles Cardiovascular: Regular rate and rhythm without murmur gallop or rub normal S1 and S2 Abdomen: negative abdominal pain, nondistended, positive soft, bowel sounds, no rebound, no ascites, no appreciable mass, hiccups returned    Discharge Instructions   Allergies as of 03/06/2017   No Known Allergies     Medication List    STOP taking these medications   chlorproMAZINE 25 MG tablet Commonly known as:  THORAZINE   clopidogrel 75 MG tablet Commonly known as:  PLAVIX   metoCLOPramide 10 MG tablet Commonly known as:  REGLAN   mirtazapine 15 MG tablet Commonly known as:  REMERON   omeprazole 40 MG capsule Commonly known as:  PRILOSEC     TAKE these medications   amoxicillin 500 MG capsule Commonly known as:  AMOXIL Take 2 capsules (1,000 mg total) by mouth every 12 (twelve) hours.   Baclofen 5 MG Tabs Take 5 mg by mouth 3 (three) times daily.   clarithromycin 500 MG tablet Commonly known as:  BIAXIN Take 1 tablet (500 mg total) by mouth every 12 (twelve) hours.   famotidine 40 MG tablet Commonly known as:  PEPCID Take 1 tablet (40 mg total) by mouth at bedtime.   ondansetron 4 MG disintegrating tablet Commonly known as:  ZOFRAN ODT 4mg  ODT q4 hours prn nausea/vomit   pantoprazole 40 MG tablet Commonly known as:  PROTONIX Take 1 tablet (40 mg total) by mouth 2 (two)  times daily.   sucralfate 1 g tablet Commonly known as:  CARAFATE Take 1 tablet (1 g total) by mouth 3 (three) times daily. What changed:  when to take this   Vitamin D (Ergocalciferol) 50000 units Caps capsule Commonly known as:  DRISDOL Take 50,000 Units by mouth every 7 (seven) days.            Durable Medical Equipment        Start     Ordered   03/06/17 1727  For home use only DME standard manual wheelchair with seat cushion  Once    Comments:  Patient suffers from deconditioning which impairs their ability to perform daily activities ADLs, a walking aid will not resolve issue.with performing activities of daily living. A wheelchair will allow patient to safely perform daily activities. Patient can safely propel the wheelchair in the home or has a caregiver who can provide assistance.  Accessories: elevating leg rests (ELRs), wheel locks, extensions and anti-tippers.   03/06/17 1727       Discharge Care Instructions        Start     Ordered   03/06/17 0000  sucralfate (CARAFATE) 1 g  tablet  3 times daily     03/06/17 1722   03/06/17 0000  amoxicillin (AMOXIL) 500 MG capsule  Every 12 hours     03/06/17 1722   03/06/17 0000  baclofen 5 MG TABS  3 times daily     03/06/17 1722   03/06/17 0000  clarithromycin (BIAXIN) 500 MG tablet  Every 12 hours     03/06/17 1722   03/06/17 0000  famotidine (PEPCID) 40 MG tablet  Daily at bedtime     03/06/17 1722   03/06/17 0000  pantoprazole (PROTONIX) 40 MG tablet  2 times daily     03/06/17 1722     No Known Allergies Follow-up Information    Levin Erp, PA Follow up on 03/24/2017.   Specialty:  Gastroenterology Why:  10:15 with PA for Dr Carlean Purl.   Contact information: [address] [place] [address] [phone]        [facility]. Schedule an appointment as soon as possible for a visit in 2 week(s).   Why:  Schedule establish care in 1-2 weeks with [facility], chronic  diastolic CHF, risk stratification (stress test?) Contact information: [place] [address]           The results of significant diagnostics from this hospitalization (including imaging, microbiology, ancillary and laboratory) are listed below for reference.    Significant Diagnostic Studies: US Renal  Result Date: 03/04/2017 CLINICAL DATA:  Acute kidney injury. EXAM: RENAL / URINARY TRACT ULTRASOUND COMPLETE COMPARISON:  CT 11/26/2016 FINDINGS: Right Kidney: Length: 9.8 cm. Kidney is partially obscured by adjacent rib shadowing. Echogenicity grossly within normal limits. No hydronephrosis. No visualized mass. Left Kidney: Length: 9.4 cm. Echogenicity within normal limits. No hydronephrosis. Small cyst on prior exam is not well seen sonographically. Bladder: Appears normal for degree of bladder distention. IMPRESSION: Unremarkable renal ultrasound. Electronically Signed   By: Jeb Levering M.D.   On: 03/04/2017 00:20   Dg Chest Port 1 View  Result Date: 03/03/2017 CLINICAL DATA:  Hematemesis.  Weakness. EXAM: PORTABLE CHEST 1 VIEW COMPARISON:  Remote radiographs 07/07/2006 FINDINGS: Heart size upper normal. Atherosclerosis and tortuosity of the thoracic aorta. No pulmonary edema. No consolidation, pleural fluid or pneumothorax. Remote left rib fractures. IMPRESSION: 1. No acute chest finding. 2. Tortuous atherosclerotic thoracic aorta. Electronically Signed   By: Jeb Levering M.D.   On: 03/03/2017 21:04    Microbiology: Recent Results (from the past 240 hour(s))  MRSA PCR Screening     Status: None   Collection Time: 03/04/17 12:48 AM  Result Value Ref Range Status   MRSA by PCR NEGATIVE NEGATIVE Final    Comment:        The GeneXpert MRSA Assay (FDA approved for NASAL specimens only), is one component of a comprehensive MRSA colonization surveillance program. It is not intended to diagnose MRSA infection nor to guide or monitor  treatment for MRSA infections.      Labs: Basic Metabolic Panel:  Recent Labs Lab 03/03/17 2011 03/03/17 2050 03/04/17 0715 03/05/17 0619 03/06/17 0211  NA 134* 136 139 137 137  K 4.3 4.2 3.8 3.8 3.7  CL 100* 100* 108 108 107  CO2 20*  --  23 23 24   GLUCOSE 278* 275* 133* 114* 125*  BUN 43* 41* 28* 17 12  CREATININE 1.68* 1.60* 1.26* 1.28* 1.20  CALCIUM 9.9  --  8.3* 8.2* 8.1*  MG  --   --   --  1.7 1.7   Liver Function Tests:  Recent Labs Lab 03/03/17 2011 03/04/17 0715  AST 22 18  ALT 12* 10*  ALKPHOS 83 63  BILITOT 0.5 0.5  PROT 7.4 6.0*  ALBUMIN 3.8 3.0*    Recent Labs Lab 03/03/17 2011  LIPASE 29   No results for input(s): AMMONIA in the last 168 hours. CBC:  Recent Labs Lab 03/03/17 2011  03/04/17 1807 03/05/17 0619 03/05/17 1637 03/06/17 0211 03/06/17 1616  WBC 17.2*  --   --   --   --   --   --   HGB 10.0*  < > 8.3* 7.2* 7.9* 8.8* 9.1*  HCT 33.3*  < > 27.7* 24.5* 25.8* 29.2* 29.9*  MCV 72.4*  --   --   --   --   --   --   PLT 401*  --   --   --   --   --   --   < > = values in this interval not displayed. Cardiac Enzymes: No results for input(s): CKTOTAL, CKMB, CKMBINDEX, TROPONINI in the last 168 hours. BNP: BNP (last 3 results) No results for input(s): BNP in the last 8760 hours.  ProBNP (last 3 results) No results for input(s): PROBNP in the last 8760 hours.  CBG:  Recent Labs Lab 03/04/17 1053 03/04/17 1607 03/05/17 0755 03/05/17 1143 03/06/17 0809  GLUCAP 132* 144* 124* 137* 109*       Signed:  Dia Crawford, MD [facility] [phone] pager

## 2017-03-06 NOTE — Progress Notes (Signed)
The patient has been given discharge instructions with his wife and son at bedside. They have follow up appointments and paper prescriptions. Bedtime medication given before discharge per MD woods. Will be discharging with his son via car.   Saddie Benders RN

## 2017-03-06 NOTE — Plan of Care (Signed)
Problem: Fluid Volume: Goal: Ability to maintain a balanced intake and output will improve Outcome: Progressing Patient given 1 unit of PRBC, vitals signs stable. NSR on tele. Room Air.

## 2017-03-06 NOTE — Progress Notes (Signed)
Subjective: Complaining of hiccups.  Objective: Vital signs in last 24 hours: Temp:  [97.9 F (36.6 C)-98.4 F (36.9 C)] 98.4 F (36.9 C) (09/09 0424) Pulse Rate:  [79-100] 79 (09/09 0424) Resp:  [15-24] 17 (09/09 0424) BP: (108-154)/(64-91) 108/64 (09/09 0424) SpO2:  [97 %-98 %] 98 % (09/09 0424)    Intake/Output from previous day: 09/08 0701 - 09/09 0700 In: 1250 [P.O.:100; I.V.:850; Blood:300] Out: 1050 [Urine:1050] Intake/Output this shift: No intake/output data recorded.  General appearance: alert and no distress GI: soft, non-tender; bowel sounds normal; no masses,  no organomegaly  Lab Results:  Recent Labs  03/03/17 2011  03/05/17 0619 03/05/17 1637 03/06/17 0211  WBC 17.2*  --   --   --   --   HGB 10.0*  < > 7.2* 7.9* 8.8*  HCT 33.3*  < > 24.5* 25.8* 29.2*  PLT 401*  --   --   --   --   < > = values in this interval not displayed. BMET  Recent Labs  03/04/17 0715 03/05/17 0619 03/06/17 0211  NA 139 137 137  K 3.8 3.8 3.7  CL 108 108 107  CO2 23 23 24   GLUCOSE 133* 114* 125*  BUN 28* 17 12  CREATININE 1.26* 1.28* 1.20  CALCIUM 8.3* 8.2* 8.1*   LFT  Recent Labs  03/04/17 0715  PROT 6.0*  ALBUMIN 3.0*  AST 18  ALT 10*  ALKPHOS 63  BILITOT 0.5   PT/INR  Recent Labs  03/04/17 0047  LABPROT 14.4  INR 1.13   Hepatitis Panel No results for input(s): HEPBSAG, HCVAB, HEPAIGM, HEPBIGM in the last 72 hours. C-Diff No results for input(s): CDIFFTOX in the last 72 hours. Fecal Lactopherrin No results for input(s): FECLLACTOFRN in the last 72 hours.  Studies/Results: No results found.  Medications:  Scheduled: . amoxicillin  1,000 mg Oral Q12H  . clarithromycin  500 mg Oral Q12H  . famotidine  40 mg Oral QHS  . [START ON 03/07/2017] pantoprazole  40 mg Intravenous Q12H  . sucralfate  1 g Oral TID WC & HS   Continuous: . dextrose 5 % and 0.9% NaCl 100 mL/hr at 03/06/17 0200    Assessment/Plan: 1) LA Grade D esophagitis. 2)  Singultus.   In the past the patient has had intermittent hiccups.  At this time the hiccups can be attributed to his esophagitis and he is on the proper medication.  The options of thorazine and baclofen have side effects, but I will start him on baclofen as he will need some longer term control.  Nitroglycerin is another option if he fails treatment.  Plan: 1) Continue with PPI and sucralfate. 2) Trail of baclofen.  LOS: 3 days   Logan [name] D 03/06/2017, 7:59 AM

## 2017-03-07 ENCOUNTER — Telehealth: Payer: Self-pay

## 2017-03-07 ENCOUNTER — Encounter: Payer: Self-pay | Admitting: Internal Medicine

## 2017-03-07 MED ORDER — ONDANSETRON 4 MG PO TBDP: ORAL_TABLET | ORAL | 2 refills | Status: DC

## 2017-03-07 NOTE — Telephone Encounter (Signed)
appt rescheduled for 03/28/17 at 3:00.  Letter mailed to the patient and Patti Martinique, CMA will notify the patient

## 2017-03-07 NOTE — Telephone Encounter (Signed)
Refill 2 #30 dispensed

## 2017-03-07 NOTE — Telephone Encounter (Signed)
Patient has been worked into 03/09/17 schedule and I will call and remind them tomorrow per their request to be here at 10:00AM for a 10:15AM appointment with Dr Carlean Purl.

## 2017-03-07 NOTE — Telephone Encounter (Signed)
Ondansetron refilled

## 2017-03-07 NOTE — Telephone Encounter (Signed)
Patient was rx'ed ondansetron 4mg  in the hospital and now [facility] is requesting a refill, please advise Sir?

## 2017-03-07 NOTE — Telephone Encounter (Signed)
I spoke to the son Manish at phone # [phone].  I spelled the name of the clarithromycin and amoxicillin that we want him to hold.  He verbalized understanding.  He is requesting that his Dad be seen sooner than Oct 1st because he wants to travel to his other son's house (unsure where this is) .  Please advise where you want me to work him in [place], thank you.

## 2017-03-07 NOTE — Telephone Encounter (Signed)
Also, I do not see that he has had H. pylori gastritis. I tested him in June and it was negative. There are no results from the hospitalization that are positive for H. pylori, but surgical pathology is not yet back. He should not take Biaxin and amoxicillin at this time.

## 2017-03-07 NOTE — Telephone Encounter (Signed)
error 

## 2017-03-08 NOTE — Telephone Encounter (Signed)
I called and reminded them of the appointment tomorrow.

## 2017-03-09 ENCOUNTER — Ambulatory Visit (INDEPENDENT_AMBULATORY_CARE_PROVIDER_SITE_OTHER): Payer: Medicare Other | Admitting: Internal Medicine

## 2017-03-09 ENCOUNTER — Encounter: Payer: Self-pay | Admitting: Internal Medicine

## 2017-03-09 VITALS — BP 110/62 | HR 84 | Ht 66.0 in | Wt 137.0 lb

## 2017-03-09 DIAGNOSIS — K2100 Gastro-esophageal reflux disease with esophagitis, without bleeding: Secondary | ICD-10-CM

## 2017-03-09 DIAGNOSIS — R066 Hiccough: Secondary | ICD-10-CM

## 2017-03-09 DIAGNOSIS — I639 Cerebral infarction, unspecified: Secondary | ICD-10-CM | POA: Diagnosis not present

## 2017-03-09 DIAGNOSIS — K2211 Ulcer of esophagus with bleeding: Secondary | ICD-10-CM

## 2017-03-09 MED ORDER — RANITIDINE HCL 300 MG PO TABS: 300.0000 mg | ORAL_TABLET | Freq: Every day | ORAL | 5 refills | Status: DC

## 2017-03-09 NOTE — Patient Instructions (Addendum)
  Do not lie down more than 8 hours a day.  Sit up right after meals.  Only take the medicines on your list.  We have sent the following medications to your pharmacy for you to pick up at your convenience: Ranitidine   We are giving you information to read on GERD.   Dr Carlean Purl is going to reach out to Dr Posey Pronto about moving his appointment sooner.  Per Dr Carlean Purl we are not prescribing a blood pressure medicine today as with his recent GI bleed he doesn't need one at this time.  He needs to check in with a Dr once he goes to [place] and have his blood pressure checked.    I appreciate the opportunity to care for you. Silvano Rusk, MD, [facility]

## 2017-03-09 NOTE — Progress Notes (Signed)
Logan [name] 82 y.o. [birth year] [id number]  Assessment & Plan:   Encounter Diagnoses  Name Primary?  . Esophageal ulcer with bleeding Yes  . Gastroesophageal reflux disease with esophagitis   . Intractable hiccoughs      I think he had exacerbation of his reflux esophagitis because his been lying down much of the day leading to worsening reflux. This might have had something to do with his hiccups worsening also. He is better now with respect to that. He sleeps a lot. He has been on Remeron but apparently that was discontinued. I'm not sure when. I suspect he has some component of depression leading to hypersomnolence and recumbency.  At this point the plan is to treat aggressively with Carafate 1 g 4 times a day, twice a day pantoprazole 40 mg and ranitidine 300 mg at bedtime. No more than 8 hours lying down. Other reflux precautions as well handout provided.  I went through his medications extensively with his son, he is to hold clopidogrel which is prescribed because of previous stroke. They asked about blood pressure medication but his blood pressures have been fine, he is mildly anemic at this point and I would hold off on that and he can discuss that with his physician in [place]. Omeprazole was prescribed in [place] he is now on pantoprazole also don't take that. I don't think Reglan makes sense in him either at this point due to the potential side effects are understand why was prescribed in the past we will stop that. That is also in his pill pack from [place].  I have recommended that he discuss possible antidepressant therapy with his [place] physician he is going up there for another 6 months to spend time with his other son.  Stay on baclofen for the hiccups at this time he could take chlorpromazine when necessary on top of that if needed. I am hoping that better treatment of his esophagitis will help and resolution of his esophagitis will help with the hiccups  as well. My partner raised the question of whether or not there could be some central effect causing the hiccups. That is possible. I will recheck to neurology regarding that opinion, they are quite booked up and there is no appointment for some time. I'm not sure what we would do differently either.   I had previously stopped and reminded them to stay off Biaxin and amoxicillin which was prescribed erroneously for H. pylori gastritis which is a wrong diagnosis.   Subjective:   Chief Complaint:Hiccups and reflux esophagitis with bleeding  HPI The patient is here with his wife and son son serves as Optometrist. He was admitted to the hospital within the last couple of weeks with coffee ground and red hematemesis and found to have severe ulcerative esophagitis. He has been lying around the house quite a bit, sleepy somnolent and not upright much. Hiccups returned in this setting is well though they had resolved after I had seen him in June. Endoscopy at that time showed mild reflux esophagitis. Chlorpromazine when necessary had been helping. But I don't think he had needed that in some time. They have a pill pack from when he was seen in treated in [place], with medications that include clopidogrel daily Carafate 4 times a day omeprazole daily Reglan 4 times a day. He is not taking any of those. He was prescribed I Axson and amoxicillin because the hospital was thought that he had H. pylori gastritis but  I biopsied him for H. pylori and that was negative in June. He does not have H. pylori gastritis. At this time he is not having hiccups. No more vomiting of blood. Preparing to return to [place] for 6 months to be with his other son.  Wt Readings from Last 3 Encounters:  03/09/17 137 lb (62.1 kg)  03/03/17 145 lb (65.8 kg)  12/02/16 143 lb (64.9 kg)    No Known Allergies Current Meds  Medication Sig  . baclofen 5 MG TABS Take 5 mg by mouth 3 (three) times daily.  . pantoprazole (PROTONIX)  40 MG tablet Take 1 tablet (40 mg total) by mouth 2 (two) times daily.   Past Medical History:  Diagnosis Date  . Anemia   . Cataract   . Esophageal ulcer   . Gastritis    proximal  . Hiatal hernia   . Hyperlipidemia   . Intractable hiccups   . Reflux esophagitis   . Stroke [facility])    r side deficits; September 2017  . Upper GI bleed    Past Surgical History:  Procedure Laterality Date  . COLONOSCOPY    . ESOPHAGOGASTRODUODENOSCOPY    . ESOPHAGOGASTRODUODENOSCOPY N/A 03/04/2017   Procedure: ESOPHAGOGASTRODUODENOSCOPY (EGD);  Surgeon: Mauri Pole, MD;  Location: [facility] ENDOSCOPY;  Service: Endoscopy;  Laterality: N/A;  . UPPER GASTROINTESTINAL ENDOSCOPY     Social History    Social History Narrative   Married 2 sons (at least)   Spends time with them in [place] and [place]   Family history noncontributory   Review of Systems  As per history of present illness Objective:   Physical Exam @BP  110/62 (BP Location: Left Arm, Patient Position: Sitting, Cuff Size: Normal)   Pulse 84   Ht 5\' 6"  (1.676 m)   Wt 137 lb (62.1 kg)   BMI 22.11 kg/m @  General:  NAD Eyes:   anicteric Lungs:  clear Heart::  S1S2 no rubs, murmurs or gallops Abdomen:  soft and nontender, BS+ Ext:   no edema, cyanosis or clubbing    Data Reviewed:  Hospital records upper endoscopy report with labs.  40 minutes of time face-to-face with this patient and family over half of which was in counseling and coordination of care

## 2017-03-15 ENCOUNTER — Telehealth: Payer: Self-pay | Admitting: Internal Medicine

## 2017-03-15 DIAGNOSIS — R066 Hiccough: Secondary | ICD-10-CM

## 2017-03-15 DIAGNOSIS — I639 Cerebral infarction, unspecified: Secondary | ICD-10-CM

## 2017-03-15 DIAGNOSIS — N3942 Incontinence without sensory awareness: Secondary | ICD-10-CM

## 2017-03-15 NOTE — Telephone Encounter (Signed)
Son notified of MRI scheduled for 03/24/17 4:00 at [address].  He verbalized understanding of appt details.

## 2017-03-15 NOTE — Telephone Encounter (Signed)
Spoke to son  Will order MR brain - orders pended  I had already communicated with neuro re plans  Also hold baclofen to see if contributing to urinary incontinence

## 2017-03-15 NOTE — Telephone Encounter (Signed)
Patient's son called and was wondering the status of the Neurology appointment that Dr. Carlean Purl was working on. Please advise Dr. Carlean Purl.

## 2017-03-17 ENCOUNTER — Telehealth: Payer: Self-pay | Admitting: Internal Medicine

## 2017-03-17 MED ORDER — SUCRALFATE 1 G PO TABS: 1.0000 g | ORAL_TABLET | Freq: Three times a day (TID) | ORAL | 3 refills | Status: DC

## 2017-03-17 MED ORDER — PANTOPRAZOLE SODIUM 40 MG PO TBEC: 40.0000 mg | DELAYED_RELEASE_TABLET | Freq: Two times a day (BID) | ORAL | 6 refills | Status: DC

## 2017-03-17 MED ORDER — BACLOFEN 5 MG PO TABS: 5.0000 mg | ORAL_TABLET | Freq: Three times a day (TID) | ORAL | 3 refills | Status: DC

## 2017-03-17 NOTE — Telephone Encounter (Signed)
MRI cancelled Protonix, baclofen, and carafate rx refilled.   Patient declines MRI.  He is leaving for [place] this week and thanks [facility] for the care.

## 2017-03-24 ENCOUNTER — Ambulatory Visit (HOSPITAL_COMMUNITY): Payer: Medicare Other

## 2017-03-24 ENCOUNTER — Ambulatory Visit: Payer: Medicare Other | Admitting: Physician Assistant

## 2017-03-28 ENCOUNTER — Ambulatory Visit: Payer: Medicare Other | Admitting: Internal Medicine

## 2017-03-29 ENCOUNTER — Telehealth: Payer: Self-pay | Admitting: Internal Medicine

## 2017-03-29 NOTE — Telephone Encounter (Signed)
Left message for patient to call back  

## 2017-03-29 NOTE — Telephone Encounter (Signed)
Patient sons states he needs to talk to nurse about patient medication affecting his blood pressure.

## 2017-03-29 NOTE — Telephone Encounter (Signed)
I am willing to  do it but he needs to bring [place] the medication bottles as not on lists here - were rxed in [place]  His BP was NL when last here  Are they checking it?  CEG

## 2017-03-29 NOTE — Telephone Encounter (Signed)
Patient' son is calling to get a rx for his father's hypertension.  Patient is still here in [place] and has not returned to [place] yet.  Last note says you would not prescribe.  Please advise.

## 2017-04-01 NOTE — Telephone Encounter (Signed)
Patient's son notified.  He will come by one day next week.

## 2017-04-10 ENCOUNTER — Encounter (HOSPITAL_COMMUNITY): Payer: Self-pay | Admitting: Internal Medicine

## 2017-04-10 ENCOUNTER — Emergency Department (HOSPITAL_COMMUNITY): Payer: Medicare Other

## 2017-04-10 ENCOUNTER — Inpatient Hospital Stay (HOSPITAL_COMMUNITY)
Admission: EM | Admit: 2017-04-10 | Discharge: 2017-04-13 | DRG: 378 | Disposition: A | Discharge status: Skilled Nursing Facility | Payer: Medicare Other | Attending: Internal Medicine | Admitting: Internal Medicine

## 2017-04-10 DIAGNOSIS — D509 Iron deficiency anemia, unspecified: Secondary | ICD-10-CM | POA: Diagnosis present

## 2017-04-10 DIAGNOSIS — K920 Hematemesis: Secondary | ICD-10-CM | POA: Diagnosis present

## 2017-04-10 DIAGNOSIS — R5381 Other malaise: Secondary | ICD-10-CM

## 2017-04-10 DIAGNOSIS — K228 Other specified diseases of esophagus: Secondary | ICD-10-CM | POA: Diagnosis present

## 2017-04-10 DIAGNOSIS — R739 Hyperglycemia, unspecified: Secondary | ICD-10-CM | POA: Diagnosis present

## 2017-04-10 DIAGNOSIS — Z8673 Personal history of transient ischemic attack (TIA), and cerebral infarction without residual deficits: Secondary | ICD-10-CM

## 2017-04-10 DIAGNOSIS — R1310 Dysphagia, unspecified: Secondary | ICD-10-CM | POA: Diagnosis present

## 2017-04-10 DIAGNOSIS — K922 Gastrointestinal hemorrhage, unspecified: Secondary | ICD-10-CM | POA: Diagnosis not present

## 2017-04-10 DIAGNOSIS — E440 Moderate protein-calorie malnutrition: Secondary | ICD-10-CM | POA: Diagnosis present

## 2017-04-10 DIAGNOSIS — Z8719 Personal history of other diseases of the digestive system: Secondary | ICD-10-CM | POA: Diagnosis not present

## 2017-04-10 DIAGNOSIS — I5032 Chronic diastolic (congestive) heart failure: Secondary | ICD-10-CM | POA: Diagnosis present

## 2017-04-10 DIAGNOSIS — N183 Chronic kidney disease, stage 3 (moderate): Secondary | ICD-10-CM | POA: Diagnosis present

## 2017-04-10 DIAGNOSIS — R000 Tachycardia, unspecified: Secondary | ICD-10-CM | POA: Diagnosis present

## 2017-04-10 DIAGNOSIS — Z681 Body mass index (BMI) 19 or less, adult: Secondary | ICD-10-CM

## 2017-04-10 DIAGNOSIS — R112 Nausea with vomiting, unspecified: Secondary | ICD-10-CM

## 2017-04-10 DIAGNOSIS — Z79899 Other long term (current) drug therapy: Secondary | ICD-10-CM | POA: Diagnosis not present

## 2017-04-10 DIAGNOSIS — R066 Hiccough: Secondary | ICD-10-CM | POA: Diagnosis present

## 2017-04-10 DIAGNOSIS — D649 Anemia, unspecified: Secondary | ICD-10-CM | POA: Diagnosis not present

## 2017-04-10 DIAGNOSIS — K2100 Gastro-esophageal reflux disease with esophagitis, without bleeding: Secondary | ICD-10-CM

## 2017-04-10 DIAGNOSIS — E785 Hyperlipidemia, unspecified: Secondary | ICD-10-CM | POA: Diagnosis present

## 2017-04-10 DIAGNOSIS — N179 Acute kidney failure, unspecified: Secondary | ICD-10-CM | POA: Diagnosis present

## 2017-04-10 DIAGNOSIS — E441 Mild protein-calorie malnutrition: Secondary | ICD-10-CM | POA: Diagnosis present

## 2017-04-10 DIAGNOSIS — D500 Iron deficiency anemia secondary to blood loss (chronic): Secondary | ICD-10-CM | POA: Diagnosis not present

## 2017-04-10 LAB — COMPREHENSIVE METABOLIC PANEL
ALT: 11 U/L — ABNORMAL LOW (ref 17–63)
AST: 15 U/L (ref 15–41)
Albumin: 3.1 g/dL — ABNORMAL LOW (ref 3.5–5.0)
Alkaline Phosphatase: 62 U/L (ref 38–126)
Anion gap: 10 (ref 5–15)
BILIRUBIN TOTAL: 0.7 mg/dL (ref 0.3–1.2)
BUN: 36 mg/dL — AB (ref 6–20)
CHLORIDE: 96 mmol/L — AB (ref 101–111)
CO2: 29 mmol/L (ref 22–32)
Calcium: 8.4 mg/dL — ABNORMAL LOW (ref 8.9–10.3)
Creatinine, Ser: 1.38 mg/dL — ABNORMAL HIGH (ref 0.61–1.24)
GFR, EST AFRICAN AMERICAN: 55 mL/min — AB (ref >60)
GFR, EST NON AFRICAN AMERICAN: 47 mL/min — AB (ref >60)
Glucose, Bld: 147 mg/dL — ABNORMAL HIGH (ref 65–99)
POTASSIUM: 3.7 mmol/L (ref 3.5–5.1)
Sodium: 135 mmol/L (ref 135–145)
TOTAL PROTEIN: 6.4 g/dL — AB (ref 6.5–8.1)

## 2017-04-10 LAB — CBC WITH DIFFERENTIAL/PLATELET
BASOS PCT: 0 %
Basophils Absolute: 0 10*3/uL (ref 0.0–0.1)
EOS PCT: 0 %
Eosinophils Absolute: 0 10*3/uL (ref 0.0–0.7)
HEMATOCRIT: 31.2 % — AB (ref 39.0–52.0)
Hemoglobin: 9.4 g/dL — ABNORMAL LOW (ref 13.0–17.0)
LYMPHS ABS: 2 10*3/uL (ref 0.7–4.0)
LYMPHS PCT: 16 %
MCH: 21.7 pg — AB (ref 26.0–34.0)
MCHC: 30.1 g/dL (ref 30.0–36.0)
MCV: 72.1 fL — AB (ref 78.0–100.0)
MONO ABS: 0.9 10*3/uL (ref 0.1–1.0)
MONOS PCT: 7 %
NEUTROS ABS: 9.4 10*3/uL — AB (ref 1.7–7.7)
NEUTROS PCT: 77 %
PLATELETS: 402 10*3/uL — AB (ref 150–400)
RBC: 4.33 MIL/uL (ref 4.22–5.81)
RDW: 16.2 % — AB (ref 11.5–15.5)
WBC: 12.3 10*3/uL — AB (ref 4.0–10.5)

## 2017-04-10 LAB — TYPE AND SCREEN
ABO/RH(D): B POS
Antibody Screen: NEGATIVE

## 2017-04-10 LAB — PROTIME-INR
INR: 1.12
PROTHROMBIN TIME: 14.3 s (ref 11.4–15.2)

## 2017-04-10 LAB — LIPASE, BLOOD: LIPASE: 36 U/L (ref 11–51)

## 2017-04-10 LAB — POC OCCULT BLOOD, ED: Fecal Occult Bld: POSITIVE — AB

## 2017-04-10 LAB — I-STAT TROPONIN, ED: TROPONIN I, POC: 0.02 ng/mL (ref 0.00–0.08)

## 2017-04-10 MED ORDER — ONDANSETRON HCL 4 MG/2ML IJ SOLN
4.0000 mg | Freq: Once | INTRAMUSCULAR | Status: AC
  Administered 2017-04-10: 4 mg via INTRAVENOUS
  Filled 2017-04-10: qty 2

## 2017-04-10 MED ORDER — SODIUM CHLORIDE 0.9 % IV SOLN
80.0000 mg | Freq: Once | INTRAVENOUS | Status: AC
  Administered 2017-04-10: 21:00:00 80 mg via INTRAVENOUS
  Filled 2017-04-10: qty 80

## 2017-04-10 MED ORDER — SODIUM CHLORIDE 0.9 % IV BOLUS (SEPSIS)
1000.0000 mL | Freq: Once | INTRAVENOUS | Status: AC
  Administered 2017-04-11: 1000 mL via INTRAVENOUS

## 2017-04-10 MED ORDER — GI COCKTAIL ~~LOC~~
30.0000 mL | Freq: Once | ORAL | Status: AC
  Administered 2017-04-10: 30 mL via ORAL
  Filled 2017-04-10: qty 30

## 2017-04-10 NOTE — H&P (Signed)
History and Physical    Logan [name] QZR:[id number] DOB: September 13, 1934 DOA: 04/10/2017  PCP:  Lives in [place], here visiting Consultants:  Logan [name], GI Patient coming from:  He has been visiting since March, leaving at the end of the next month or two, staying with children and 2 grandchildren; NOKApolinar [name], [place]  Chief Complaint: hematemesis  HPI: Logan [name] is a 82 y.o. male with medical history significant of upper GI bleeding in June (grade A esophagitis) and September (bleeding esophageal ulcers); CVA; and HLD presenting with recurrent hematemesis.  For the last 4 days, he has not been able to keep food down and so has not been taking medicines.  He has been taking generic Zantac and protonix but unable to take them recently.  He has been vomiting up a black liquid and whatever he eats.  10-15 times of emesis per day for the last 4 days.  He also had esophageal and midepigastric discomfort today.  No fevers.  Also black stools, but no BM in the last 4 days.  Uncertain how long this has been gong on.  +constipated.  History was obtained by the granddaughter since the patient does not speak [place].   ED Course: Type and screen; Protonix 80 mg IV: Zofran; 1L bolus  Review of Systems: As per HPI; otherwise review of systems reviewed and negative.   Ambulatory Status:  Ambulates with a walker  Past Medical History:  Diagnosis Date  . Anemia   . Cataract   . Esophageal ulcer   . Gastritis    proximal  . Hiatal hernia   . Hyperlipidemia   . Intractable hiccups   . Reflux esophagitis   . Stroke [facility])    r side deficits; September 2017  . Upper GI bleed     Past Surgical History:  Procedure Laterality Date  . COLONOSCOPY    . ESOPHAGOGASTRODUODENOSCOPY    . ESOPHAGOGASTRODUODENOSCOPY N/A 03/04/2017   Procedure: ESOPHAGOGASTRODUODENOSCOPY (EGD);  Surgeon: Mauri Pole, MD;  Location: [facility] ENDOSCOPY;  Service: Endoscopy;  Laterality: N/A;  . UPPER GASTROINTESTINAL  ENDOSCOPY      Social History   Social History  . Marital status: Married    Spouse name: N/A  . Number of children: N/A  . Years of education: N/A   Occupational History  . retired    Social History Main Topics  . Smoking status: Never Smoker  . Smokeless tobacco: Never Used  . Alcohol use No  . Drug use: No  . Sexual activity: Not on file   Other Topics Concern  . Not on file   Social History Narrative   Married 2 sons (at least)   Spends time with them in [place] and [place]    No Known Allergies  Family History  Problem Relation Age of Onset  . Colon cancer Neg Hx   . Esophageal cancer Neg Hx   . Rectal cancer Neg Hx     Prior to Admission medications   Medication Sig Start Date End Date Taking? Authorizing Provider  Baclofen 5 MG TABS Take 5 mg by mouth 3 (three) times daily. 03/17/17  Yes [name], MD  chlorproMAZINE (THORAZINE) 25 MG tablet Take 1 tablet (25 mg total) by mouth 3 (three) times daily as needed. 03/09/17  Yes [name], MD  ondansetron (ZOFRAN ODT) 4 MG disintegrating tablet 4mg  ODT q4 hours prn nausea/vomit 03/07/17  Yes [name], MD  pantoprazole (PROTONIX) 40 MG tablet Take 1 tablet (40  mg total) by mouth 2 (two) times daily. 03/17/17 03/17/18 Yes [name], MD  ranitidine (ZANTAC) 300 MG tablet Take 1 tablet (300 mg total) by mouth at bedtime. 03/09/17  Yes [name], MD  sucralfate (CARAFATE) 1 g tablet Take 1 tablet (1 g total) by mouth 4 (four) times daily -  with meals and at bedtime. 03/17/17  Yes [name], MD    Physical Exam: Vitals:   04/10/17 2300 04/10/17 2330 04/11/17 0000 04/11/17 0030  BP: 126/63 118/88 (!) 138/92 140/86  Pulse: 97 (!) 107 (!) 107 (!) 101  Resp: 14 20 14 15   Temp:      TempSrc:      SpO2: 97% 99% 99% 100%     General:  Appears calm and comfortable and is NAD but wretches intermittently and is vomiting up milk that he apparently drank just before I entered the room Eyes:  PERRL,  EOMI, normal lids, iris ENT:  grossly normal hearing, lips & tongue, mmm Neck:  no LAD, masses or thyromegaly; no carotid bruits Cardiovascular:  RRR with very mild tachycardia, no m/r/g. No LE edema.  Respiratory:   CTA bilaterally with no wheezes/rales/rhonchi.  Normal respiratory effort. Abdomen:  soft, NT other than in the midepigastric region, ND, NABS Skin:  no rash or induration seen on limited exam Musculoskeletal:  grossly normal tone BUE/BLE, good ROM, no bony abnormality Psychiatric:  grossly normal mood and affect, speech fluent and appropriate, AOx3 Neurologic:  CN 2-12 grossly intact, moves all extremities in coordinated fashion, sensation intact    Radiological Exams on Admission: Dg Chest 2 View  Result Date: 04/10/2017 CLINICAL DATA:  Abdominal pain EXAM: CHEST  2 VIEW COMPARISON:  03/03/2017 FINDINGS: Normal cardiac silhouette with ectatic aorta. Fine interstitial lung disease not changed. Remote LEFT rib fractures. No pulmonary edema. No pneumothorax. No focal infiltrate. IMPRESSION: No acute cardiopulmonary process. Electronically Signed   By: Suzy Bouchard M.D.   On: 04/10/2017 21:12    EKG: Independently reviewed.  NSR with rate 108; nonspecific ST changes with no evidence of acute ischemia   Labs on Admission: I have personally reviewed the available labs and imaging studies at the time of the admission.  Pertinent labs:   Glucose 147 BUN 36/Creatinine 1.38/GFR 47; 12/1.20/56 on 9/9 Albumin 3.1 WBC 12.3 Hgb 9.4; 10.0 on 9/6 Heme positive  Assessment/Plan Principal Problem:   Upper GI bleed Active Problems:   Chronic diastolic CHF (congestive heart failure) (HCC)   Hyperglycemia   Malnutrition of mild degree (HCC)   Upper GI bleed -Patient has history of esophagitis and esophageal ulcers causing bleeding last month -His Hgb is generally stable despite report of bleeding and heme positive stools -The patient is currently mildly tachycardic,  suggesting acute volume loss.  -Type and screen were done in ED.  - will admit to med surg bed - GI consulted by ED, will follow up recommendations - NPO for possible EGD - NS at 125 mL/hr - Start IV pantoprazole 40 mg bid (given 80 mg loading dose in ER) - Zofran IV for nausea - Avoid NSAIDs and SQ heparin - Maintain IV access (2 large bore IVs if possible). - Monitor closely and follow cbc, transfuse as necessary for Hgb <7.  Chronic diastolic CHF -Appears compensated -9/8 echo with grade 1 diastolic dysfunction and preserved EF  Hypergylcemia -May be stress response -Will follow with fasting AM labs -It is unlikely that he will need acute or chronic treatment for this  issue  Malnutrition -Mild to moderate -Consider nutrition consult once patient is able to take PO   DVT prophylaxis:  SCDs Code Status:  Full - confirmed with patient/family Family Communication: Granddaughter present throughout evaluation  Disposition Plan:  Home once clinically improved Consults called: GI  Admission status: Admit - It is my clinical opinion that admission to INPATIENT is reasonable and necessary because this patient will require at least 2 midnights in the hospital to treat this condition based on the medical complexity of the problems presented.  Given the aforementioned information, the predictability of an adverse outcome is felt to be significant.     Karmen Bongo MD [facility]  If note is complete, please contact covering daytime or nighttime physician. [email or web address] Password [facility]  04/11/2017, 12:37 AM

## 2017-04-10 NOTE — ED Provider Notes (Signed)
[facility] DEPT Provider Note   CSN: [id number] Arrival date & time: 04/10/17  1804     History   Chief Complaint Chief Complaint  Patient presents with  . Abdominal Pain    HPI Ediel Unangst is a 82 y.o. male.  HPI   Vomiting 20-30 times black vomit, black stool last 4 days ago 3 days of vomiting. Unable to eat or drink, vomiting all day today Is passing gas, did not have stool for 4 days as he has not been eating Was hospitalized 1 mo ago for same symptoms and has ahd other prior similar episodes Reports burning chest pain only with emesis. No dyspnea. No urinary symptoms. Epigastric abdominal pain, worsened by eating.   Dr. Carlean Purl reported no need for abx at outpt follow up Pantoprazole zofran but still vomiting Ranitidine    Past Medical History:  Diagnosis Date  . Anemia   . Cataract   . Esophageal ulcer   . Gastritis    proximal  . Hiatal hernia   . Hyperlipidemia   . Intractable hiccups   . Reflux esophagitis   . Stroke [facility])    r side deficits; September 2017  . Upper GI bleed     Patient Active Problem List   Diagnosis Date Noted  . Hyperglycemia 04/11/2017  . Malnutrition of mild degree ([place]) 04/11/2017  . Upper GI bleed   . Esophageal ulcer with bleeding   . Cardiomegaly   . Chronic diastolic CHF (congestive heart failure) ([place])   . Intractable hiccups   . Cerebrovascular accident (CVA) ([place])   . SVT (supraventricular tachycardia) ([place])   . Hematemesis 03/03/2017    Past Surgical History:  Procedure Laterality Date  . COLONOSCOPY    . ESOPHAGOGASTRODUODENOSCOPY    . ESOPHAGOGASTRODUODENOSCOPY N/A 03/04/2017   Procedure: ESOPHAGOGASTRODUODENOSCOPY (EGD);  Surgeon: Mauri Pole, MD;  Location: [facility] ENDOSCOPY;  Service: Endoscopy;  Laterality: N/A;  . UPPER GASTROINTESTINAL ENDOSCOPY         Home Medications    Prior to Admission medications   Medication Sig Start Date End Date Taking? Authorizing Provider  Baclofen 5 MG  TABS Take 5 mg by mouth 3 (three) times daily. 03/17/17  Yes [name], MD  chlorproMAZINE (THORAZINE) 25 MG tablet Take 1 tablet (25 mg total) by mouth 3 (three) times daily as needed. 03/09/17  Yes [name], MD  ondansetron (ZOFRAN ODT) 4 MG disintegrating tablet 4mg  ODT q4 hours prn nausea/vomit 03/07/17  Yes [name], MD  pantoprazole (PROTONIX) 40 MG tablet Take 1 tablet (40 mg total) by mouth 2 (two) times daily. 03/17/17 03/17/18 Yes [name], MD  ranitidine (ZANTAC) 300 MG tablet Take 1 tablet (300 mg total) by mouth at bedtime. 03/09/17  Yes [name], MD  sucralfate (CARAFATE) 1 g tablet Take 1 tablet (1 g total) by mouth 4 (four) times daily -  with meals and at bedtime. 03/17/17  Yes [name], MD    Family History Family History  Problem Relation Age of Onset  . Colon cancer Neg Hx   . Esophageal cancer Neg Hx   . Rectal cancer Neg Hx     Social History Social History  Substance Use Topics  . Smoking status: Never Smoker  . Smokeless tobacco: Never Used  . Alcohol use No     Allergies   Patient has no known allergies.   Review of Systems Review of Systems  Constitutional: Negative for fever.  HENT: Negative  for sore throat.   Eyes: Negative for visual disturbance.  Respiratory: Negative for shortness of breath.   Cardiovascular: Positive for chest pain.  Gastrointestinal: Positive for abdominal pain, blood in stool, nausea and vomiting. Negative for constipation and diarrhea.  Genitourinary: Negative for difficulty urinating and dysuria.  Musculoskeletal: Negative for back pain and neck stiffness.  Skin: Negative for rash.  Neurological: Negative for syncope and headaches.     Physical Exam Updated Vital Signs BP (!) 143/80   Pulse 94   Temp 97.9 F (36.6 C) (Oral)   Resp 17   SpO2 100%   Physical Exam  Constitutional: He is oriented to person, place, and time. He appears well-developed and well-nourished. No distress.   HENT:  Head: Normocephalic and atraumatic.  Eyes: Conjunctivae and EOM are normal.  Neck: Normal range of motion.  Cardiovascular: Regular rhythm, normal heart sounds and intact distal pulses.  Tachycardia present.  Exam reveals no gallop and no friction rub.   No murmur heard. Pulmonary/Chest: Effort normal and breath sounds normal. No respiratory distress. He has no wheezes. He has no rales.  Abdominal: Soft. He exhibits no distension. There is tenderness in the epigastric area. There is no guarding.  Negative murphy's  Genitourinary: Rectal exam shows guaiac positive stool.  Genitourinary Comments: Black stool on rectal exam  Musculoskeletal: He exhibits no edema.  Neurological: He is alert and oriented to person, place, and time.  Skin: Skin is warm and dry. He is not diaphoretic.  Nursing note and vitals reviewed.    ED Treatments / Results  Labs (all labs ordered are listed, but only abnormal results are displayed) Labs Reviewed  CBC WITH DIFFERENTIAL/PLATELET - Abnormal; Notable for the following:       Result Value   WBC 12.3 (*)    Hemoglobin 9.4 (*)    HCT 31.2 (*)    MCV 72.1 (*)    MCH 21.7 (*)    RDW 16.2 (*)    Platelets 402 (*)    Neutro Abs 9.4 (*)    All other components within normal limits  COMPREHENSIVE METABOLIC PANEL - Abnormal; Notable for the following:    Chloride 96 (*)    Glucose, Bld 147 (*)    BUN 36 (*)    Creatinine, Ser 1.38 (*)    Calcium 8.4 (*)    Total Protein 6.4 (*)    Albumin 3.1 (*)    ALT 11 (*)    GFR calc non Af Amer 47 (*)    GFR calc Af Amer 55 (*)    All other components within normal limits  POC OCCULT BLOOD, ED - Abnormal; Notable for the following:    Fecal Occult Bld POSITIVE (*)    All other components within normal limits  LIPASE, BLOOD  PROTIME-INR  I-STAT TROPONIN, ED  TYPE AND SCREEN    EKG  EKG Interpretation  Date/Time:  Sunday April 10 2017 21:34:19 EDT Ventricular Rate:  108 PR Interval:    QRS  Duration: 84 QT Interval:  350 QTC Calculation: 470 R Axis:   49 Text Interpretation:  Sinus tachycardia Abnormal R-wave progression, early transition No significant change since last tracing Confirmed by Gareth Morgan [phone]) on 04/10/2017 9:56:41 PM       Radiology Dg Chest 2 View  Result Date: 04/10/2017 CLINICAL DATA:  Abdominal pain EXAM: CHEST  2 VIEW COMPARISON:  03/03/2017 FINDINGS: Normal cardiac silhouette with ectatic aorta. Fine interstitial lung disease not changed. Remote LEFT rib fractures.  No pulmonary edema. No pneumothorax. No focal infiltrate. IMPRESSION: No acute cardiopulmonary process. Electronically Signed   By: Suzy Bouchard M.D.   On: 04/10/2017 21:12    Procedures Procedures (including critical care time)  Medications Ordered in ED Medications  pantoprazole (PROTONIX) 80 mg in sodium chloride 0.9 % 100 mL IVPB (0 mg Intravenous Stopped 04/10/17 2147)  gi cocktail (Maalox,Lidocaine,Donnatal) (30 mLs Oral Given 04/10/17 2204)  ondansetron (ZOFRAN) injection 4 mg (4 mg Intravenous Given 04/10/17 2116)  sodium chloride 0.9 % bolus 1,000 mL (1,000 mLs Intravenous New Bag/Given 04/11/17 0014)     Initial Impression / Assessment and Plan / ED Course  I have reviewed the triage vital signs and the nursing notes.  Pertinent labs & imaging results that were available during my care of the patient were reviewed by me and considered in my medical decision making (see chart for details).     82yo male with history above presents with concern for nausea, vomiting with black emesis and epigastric abdominal pain.  Has had similar episodes for which he has had CTs in the past.  Pt passing flatus, no distention on exam, doubt SBO. Doubt cholecystitis given negative murphy's sign, no fever, and similarity to other episodes of upper GI bleeding. No signs of appendicitis or incarcerated hernia. Reports CP with emesis only, likely esophagitis, ekg and troponin WNL.  Suspect abdominal pain secondary to PUD.  Pt with black stool on exam, black emesis similar to prior per family, elevated BUN concerning for upper GI bleed.  Given patient stable, stable hgb, feel GI evaluation is appropriate.  Will admit for emesis, dehydration, concern for GI bleed.   Final Clinical Impressions(s) / ED Diagnoses   Final diagnoses:  Upper GI bleed  Intractable vomiting with nausea, unspecified vomiting type    New Prescriptions New Prescriptions   No medications on file     Gareth Morgan, MD 04/11/17 0222

## 2017-04-10 NOTE — ED Notes (Signed)
Phlebotomy at bedside.

## 2017-04-10 NOTE — ED Triage Notes (Signed)
Pt BIB EMS from home for abdominal pain. Per EMS, pt dx with ulcer x 3 weeks ago, and for the past 3 days has been unable to keep down PO meds. Pt alert; non english speaking, speaks Hindi. Resp e/u; NAD at this time.

## 2017-04-10 NOTE — ED Notes (Signed)
Delay in lab draw,  Pt not in room at this time. 

## 2017-04-10 NOTE — ED Notes (Signed)
Patient transported to X-ray 

## 2017-04-10 NOTE — ED Notes (Signed)
Pt reports being unable to swallow, states it hurts when he swallows. Has been unable to tolerate fluids and food.

## 2017-04-11 ENCOUNTER — Telehealth: Payer: Self-pay | Admitting: Internal Medicine

## 2017-04-11 DIAGNOSIS — K210 Gastro-esophageal reflux disease with esophagitis: Secondary | ICD-10-CM

## 2017-04-11 DIAGNOSIS — R112 Nausea with vomiting, unspecified: Secondary | ICD-10-CM

## 2017-04-11 DIAGNOSIS — R739 Hyperglycemia, unspecified: Secondary | ICD-10-CM | POA: Diagnosis present

## 2017-04-11 DIAGNOSIS — E441 Mild protein-calorie malnutrition: Secondary | ICD-10-CM | POA: Diagnosis present

## 2017-04-11 DIAGNOSIS — D649 Anemia, unspecified: Secondary | ICD-10-CM

## 2017-04-11 DIAGNOSIS — D509 Iron deficiency anemia, unspecified: Secondary | ICD-10-CM

## 2017-04-11 DIAGNOSIS — R5381 Other malaise: Secondary | ICD-10-CM

## 2017-04-11 LAB — BASIC METABOLIC PANEL
ANION GAP: 8 (ref 5–15)
BUN: 34 mg/dL — ABNORMAL HIGH (ref 6–20)
CALCIUM: 8.1 mg/dL — AB (ref 8.9–10.3)
CO2: 29 mmol/L (ref 22–32)
Chloride: 100 mmol/L — ABNORMAL LOW (ref 101–111)
Creatinine, Ser: 1.39 mg/dL — ABNORMAL HIGH (ref 0.61–1.24)
GFR, EST AFRICAN AMERICAN: 54 mL/min — AB (ref >60)
GFR, EST NON AFRICAN AMERICAN: 47 mL/min — AB (ref >60)
Glucose, Bld: 113 mg/dL — ABNORMAL HIGH (ref 65–99)
POTASSIUM: 3.5 mmol/L (ref 3.5–5.1)
Sodium: 137 mmol/L (ref 135–145)

## 2017-04-11 LAB — CBC
HEMATOCRIT: 28.9 % — AB (ref 39.0–52.0)
HEMOGLOBIN: 8.6 g/dL — AB (ref 13.0–17.0)
MCH: 21.6 pg — ABNORMAL LOW (ref 26.0–34.0)
MCHC: 29.8 g/dL — ABNORMAL LOW (ref 30.0–36.0)
MCV: 72.6 fL — ABNORMAL LOW (ref 78.0–100.0)
Platelets: 368 10*3/uL (ref 150–400)
RBC: 3.98 MIL/uL — AB (ref 4.22–5.81)
RDW: 16.4 % — ABNORMAL HIGH (ref 11.5–15.5)
WBC: 9.1 10*3/uL (ref 4.0–10.5)

## 2017-04-11 LAB — IRON AND TIBC
IRON: 10 ug/dL — AB (ref 45–182)
Saturation Ratios: 3 % — ABNORMAL LOW (ref 17.9–39.5)
TIBC: 301 ug/dL (ref 250–450)
UIBC: 291 ug/dL

## 2017-04-11 LAB — FERRITIN: FERRITIN: 7 ng/mL — AB (ref 24–336)

## 2017-04-11 MED ORDER — ONDANSETRON HCL 4 MG PO TABS: 4.0000 mg | ORAL_TABLET | Freq: Four times a day (QID) | ORAL | Status: DC | PRN

## 2017-04-11 MED ORDER — CHLORPROMAZINE HCL 25 MG PO TABS
25.0000 mg | ORAL_TABLET | Freq: Three times a day (TID) | ORAL | Status: DC | PRN
  Administered 2017-04-12: 25 mg via ORAL
  Filled 2017-04-11 (×2): qty 1

## 2017-04-11 MED ORDER — ONDANSETRON HCL 4 MG/2ML IJ SOLN
4.0000 mg | Freq: Four times a day (QID) | INTRAMUSCULAR | Status: DC | PRN
  Administered 2017-04-11: 4 mg via INTRAVENOUS
  Filled 2017-04-11: qty 2

## 2017-04-11 MED ORDER — FLEET ENEMA 7-19 GM/118ML RE ENEM: 1.0000 | ENEMA | Freq: Once | RECTAL | Status: DC | PRN

## 2017-04-11 MED ORDER — BISACODYL 10 MG RE SUPP: 10.0000 mg | Freq: Every day | RECTAL | Status: DC | PRN

## 2017-04-11 MED ORDER — ONDANSETRON HCL 4 MG PO TABS
4.0000 mg | ORAL_TABLET | Freq: Three times a day (TID) | ORAL | Status: DC
  Administered 2017-04-11 – 2017-04-12 (×4): 4 mg via ORAL
  Filled 2017-04-11 (×4): qty 1

## 2017-04-11 MED ORDER — SUCRALFATE 1 GM/10ML PO SUSP
1.0000 g | Freq: Four times a day (QID) | ORAL | Status: DC
  Administered 2017-04-11 – 2017-04-13 (×10): 1 g via ORAL
  Filled 2017-04-11 (×10): qty 10

## 2017-04-11 MED ORDER — BACLOFEN 5 MG HALF TABLET
5.0000 mg | ORAL_TABLET | Freq: Three times a day (TID) | ORAL | Status: DC
  Administered 2017-04-11 – 2017-04-13 (×9): 5 mg via ORAL
  Filled 2017-04-11 (×12): qty 1

## 2017-04-11 MED ORDER — PANTOPRAZOLE SODIUM 40 MG IV SOLR
40.0000 mg | Freq: Two times a day (BID) | INTRAVENOUS | Status: DC
  Administered 2017-04-11 (×2): 40 mg via INTRAVENOUS
  Filled 2017-04-11 (×2): qty 40

## 2017-04-11 MED ORDER — ACETAMINOPHEN 650 MG RE SUPP: 650.0000 mg | Freq: Four times a day (QID) | RECTAL | Status: DC | PRN

## 2017-04-11 MED ORDER — KCL IN DEXTROSE-NACL 40-5-0.9 MEQ/L-%-% IV SOLN
INTRAVENOUS | Status: DC
  Administered 2017-04-11 – 2017-04-12 (×3): via INTRAVENOUS
  Filled 2017-04-11 (×6): qty 1000

## 2017-04-11 MED ORDER — SODIUM CHLORIDE 0.9 % IV SOLN
INTRAVENOUS | Status: DC
  Administered 2017-04-11: 07:00:00 via INTRAVENOUS

## 2017-04-11 MED ORDER — ACETAMINOPHEN 325 MG PO TABS: 650.0000 mg | ORAL_TABLET | Freq: Four times a day (QID) | ORAL | Status: DC | PRN

## 2017-04-11 NOTE — Progress Notes (Addendum)
PROGRESS NOTE    Logan [name]  TIR:[id number] DOB: 07/06/1934 DOA: 04/10/2017 PCP: Allie Bossier, MD   Brief Narrative: 82 year old male, hindi speaking with history of a stroke, dyslipidemia, upper GI bleeding due to esophagitis and bleeding esophageal ulcer presented with unable to take food and medication down associated with hematemesis, nausea and vomiting. Patient was started on IV Protonix and admitted for further evaluation.  Assessment & Plan:  #  Hematemesis: No active bleeding today.Plan to continue IV Protonix. Patient is currently nothing by mouth for possible GI procedure. GI was consulted today. Change IV fluid with dextrose and potassium. Continue supportive care.  #Prior history of a stroke and possible physical deconditioning: Family interested in nursing home placement. PT, OT and social worker consulted.  #Chronic kidney disease stage III: Serum creatinine level around baseline. Monitor BMP. Avoid nephrotoxins.  #Likely iron deficiency anemia/chronic: Check iron studies. Monitor CBC.  # decreased oral intake: Dietary evaluation.  DVT prophylaxis:SCDs Code Status:full code Family Communication:no family at bedside Disposition Plan:currently admitted    Consultants:   gI  Procedures:none Antimicrobials:none  Subjective: Seen and examined at bedside. Reported nausea and difficulty swallowing. No vomiting today no abdominal pain.  DENIED CHEST PAIN OR SHORTNESS Objective: Vitals:   04/11/17 0545 04/11/17 0600 04/11/17 0800 04/11/17 1339  BP: 112/67 115/66 121/68 130/90  Pulse: 66 (!) 56 69 68  Resp: 14 13 14 16   Temp:   98.1 F (36.7 C) 98.2 F (36.8 C)  TempSrc:   Oral Oral  SpO2: 99% 100% 100% 98%    Intake/Output Summary (Last 24 hours) at 04/11/17 1359 Last data filed at 04/11/17 1013  Gross per 24 hour  Intake             1100 ml  Output              240 ml  Net              860 ml   There were no vitals filed for this  visit.  Examination:  General exam: Appears calm and comfortable  Respiratory system: Clear to auscultation. Respiratory effort normal. No wheezing or crackle Cardiovascular system: S1 & S2 heard, RRR.  No pedal edema. Gastrointestinal system: Abdomen is nondistended, soft and nontender. Normal bowel sounds heard. Skin: No rashes, lesions or ulcers neurology: alert, awake     Data Reviewed: I have personally reviewed following labs and imaging studies  CBC:  Recent Labs Lab 04/10/17 2120 04/11/17 0650  WBC 12.3* 9.1  NEUTROABS 9.4*  --   HGB 9.4* 8.6*  HCT 31.2* 28.9*  MCV 72.1* 72.6*  PLT 402* 676   Basic Metabolic Panel:  Recent Labs Lab 04/10/17 2120 04/11/17 0650  NA 135 137  K 3.7 3.5  CL 96* 100*  CO2 29 29  GLUCOSE 147* 113*  BUN 36* 34*  CREATININE 1.38* 1.39*  CALCIUM 8.4* 8.1*   GFR: CrCl cannot be calculated (Unknown ideal weight.). Liver Function Tests:  Recent Labs Lab 04/10/17 2120  AST 15  ALT 11*  ALKPHOS 62  BILITOT 0.7  PROT 6.4*  ALBUMIN 3.1*    Recent Labs Lab 04/10/17 2120  LIPASE 36   No results for input(s): AMMONIA in the last 168 hours. Coagulation Profile:  Recent Labs Lab 04/10/17 2120  INR 1.12   Cardiac Enzymes: No results for input(s): CKTOTAL, CKMB, CKMBINDEX, TROPONINI in the last 168 hours. BNP (last 3 results) No results for input(s): PROBNP in the last 8760 hours.  HbA1C: No results for input(s): HGBA1C in the last 72 hours. CBG: No results for input(s): GLUCAP in the last 168 hours. Lipid Profile: No results for input(s): CHOL, HDL, LDLCALC, TRIG, CHOLHDL, LDLDIRECT in the last 72 hours. Thyroid Function Tests: No results for input(s): TSH, T4TOTAL, FREET4, T3FREE, THYROIDAB in the last 72 hours. Anemia Panel: No results for input(s): VITAMINB12, FOLATE, FERRITIN, TIBC, IRON, RETICCTPCT in the last 72 hours. Sepsis Labs: No results for input(s): PROCALCITON, LATICACIDVEN in the last 168  hours.  No results found for this or any previous visit (from the past 240 hour(s)).       Radiology Studies: Dg Chest 2 View  Result Date: 04/10/2017 CLINICAL DATA:  Abdominal pain EXAM: CHEST  2 VIEW COMPARISON:  03/03/2017 FINDINGS: Normal cardiac silhouette with ectatic aorta. Fine interstitial lung disease not changed. Remote LEFT rib fractures. No pulmonary edema. No pneumothorax. No focal infiltrate. IMPRESSION: No acute cardiopulmonary process. Electronically Signed   By: Suzy Bouchard M.D.   On: 04/10/2017 21:12        Scheduled Meds: . baclofen  5 mg Oral TID  . ondansetron  4 mg Oral Q8H  . pantoprazole  40 mg Intravenous Q12H  . sucralfate  1 g Oral Q6H   Continuous Infusions: . sodium chloride 125 mL/hr at 04/11/17 0654  . dextrose 5 % and 0.9 % NaCl with KCl 40 mEq/L       LOS: 1 day    Margaurite Salido Tanna Furry, MD [facility] Pager [phone]  If 7PM-7AM, please contact night-coverage [email or web address] Password [email or web address] 04/11/2017, 1:59 PM

## 2017-04-11 NOTE — Consult Note (Signed)
[facility] Consult: 9:33 AM 04/11/2017  LOS: 1 day    Referring Provider: Dr Carolin Sicks  Primary Care Physician: in [place].    Primary Gastroenterologist:  Dr. Lennox Laity, [place], [phone]   I spoke after seeing the patient/ father.   Reason for Consultation:  Dark emesis.   HPI: Logan [name] is a 82 y.o. male.  Does not speak [place].  PMH GERD.  Recalcitrant vomiting treated in past with Domperidone.  02/2016 CVA, on Plavix.  CKD 3.  Anemia.  In late 2017/early 2018 MDs in [place]  recommended fundoplication for persistent hiccups.      2007 Gastric emptying study normal.   2008 Colonoscopy for IDA: scattered tiny tics and small int hemorrhoids.  EGD: small HH, chronic gastritis, + Clo was treated.  11/26/16 CT ab/pelvis: distal esophageal thickening sugg of esophagitis.   11/2016 EGD for hiccups, emesis, mild anemia, thickened esophagus on CT.  Grade A esophagitis, 6 cm HH, gastric H Pylori clotest negative.  03/04/2017 EGD for maroon emesis and Hgb drop to 8.  Oozing, linear, esophageal ulcers, path: ulcerative esophagitis no candida or dysplasia.  Non-bleeding antral erosions.  7 cm HH.    Plan as of 03/09/17 visit with Dr Carlean Purl was QID Carafate, protonix 40 mg BID, Ranitidine 300 mg HS, Baclofen.  PRN chlorpromazine.  Limit recumbancy to no more than 8 hours daily.  Stopped were Reglan, Remeron (pt excessively sleepy).  MD questioned need for pt to start anti-depressant but deferred decision to PMD in [place].  Carlean Purl also proposed MR brain, which has not been done.  The patient's son does not think that the patient is compliant with his medical regimen or if he staying out of bed. He thinks the patient needs to go to a rehabilitation hospital after discharge  Admitted yesterday PM with black  emesis.  Has been taking generic Zantac and protonix but unable to take them recently due to epigastric pain and nausea..  Pain in lower esophagus and epigastrum.   Last BM:  10/12 it was brown. Hgb 9.4 >> 8.6.  MCV 72.  WBCs 12.3 >> 9.1.  LFTs, Lipase normal. Coags normal.  FOBT +.    Past Medical History:  Diagnosis Date  . Anemia   . Cataract   . Esophageal ulcer   . Gastritis    proximal  . Hiatal hernia   . Hyperlipidemia   . Intractable hiccups   . Reflux esophagitis   . Stroke [facility])    r side deficits; September 2017  . Upper GI bleed     Past Surgical History:  Procedure Laterality Date  . COLONOSCOPY    . ESOPHAGOGASTRODUODENOSCOPY    . ESOPHAGOGASTRODUODENOSCOPY N/A 03/04/2017   Procedure: ESOPHAGOGASTRODUODENOSCOPY (EGD);  Surgeon: Mauri Pole, MD;  Location[facility] ENDOSCOPY;  Service: Endoscopy;  Laterality: N/A;  . UPPER GASTROINTESTINAL ENDOSCOPY      Prior to Admission medications   Medication Sig Start Date End Date Taking? Authorizing Provider  Baclofen 5 MG TABS Take 5 mg by mouth 3 (three) times  daily. 03/17/17  Yes [name], MD  chlorproMAZINE (THORAZINE) 25 MG tablet Take 1 tablet (25 mg total) by mouth 3 (three) times daily as needed. 03/09/17  Yes [name], MD  ondansetron (ZOFRAN ODT) 4 MG disintegrating tablet 4mg  ODT q4 hours prn nausea/vomit 03/07/17  Yes [name], MD  pantoprazole (PROTONIX) 40 MG tablet Take 1 tablet (40 mg total) by mouth 2 (two) times daily. 03/17/17 03/17/18 Yes [name], MD  ranitidine (ZANTAC) 300 MG tablet Take 1 tablet (300 mg total) by mouth at bedtime. 03/09/17  Yes [name], MD  sucralfate (CARAFATE) 1 g tablet Take 1 tablet (1 g total) by mouth 4 (four) times daily -  with meals and at bedtime. 03/17/17  Yes [name], MD    Scheduled Meds: . baclofen  5 mg Oral TID  . pantoprazole  40 mg Intravenous Q12H   Infusions: . sodium chloride 125 mL/hr at 04/11/17 0654   PRN  Meds: acetaminophen **OR** acetaminophen, bisacodyl, chlorproMAZINE, ondansetron **OR** ondansetron (ZOFRAN) IV, sodium phosphate   Allergies as of 04/10/2017  . (No Known Allergies)    Family History  Problem Relation Age of Onset  . Colon cancer Neg Hx   . Esophageal cancer Neg Hx   . Rectal cancer Neg Hx     Social History   Social History  . Marital status: Married    Spouse name: N/A  . Number of children: N/A  . Years of education: N/A   Occupational History  . retired    Social History Main Topics  . Smoking status: Never Smoker  . Smokeless tobacco: Never Used  . Alcohol use No  . Drug use: No  . Sexual activity: Not on file   Other Topics Concern  . Not on file   Social History Narrative   Married 2 sons (at least)   Spends time with them in [place] and [place]    REVIEW OF SYSTEMS: Constitutional:  weakness. ENT:  No nose bleeds Pulm:  No trouble breathing. Occasional cough. CV:  No palpitations, no LE edema. Pain in lower chest is more esophageal then sternal. It is not exertional GU:  No hematuria, no frequency GI:  Per HPI Heme:  Other than the dark emesis. Hasn't had any unusual bleeding or bruising.   Transfusions:  none Neuro:  No headaches, no peripheral tingling or numbness Derm:  No itching, no rash or sores.  Endocrine:  No sweats or chills.  No polyuria or dysuria Immunization:  None.   Travel:  None beyond local counties in last few months.    PHYSICAL EXAM: Vital signs in last 24 hours: Vitals:   04/11/17 0600 04/11/17 0800  BP: 115/66 121/68  Pulse: (!) 56 69  Resp: 13 14  Temp:  98.1 F (36.7 C)  SpO2: 100% 100%   Wt Readings from Last 3 Encounters:  03/09/17 62.1 kg (137 lb)  03/03/17 65.8 kg (145 lb)  12/02/16 64.9 kg (143 lb)    General: frail, East Asianm, elderly gentleman Head:  No facial asymmetry. No signs of head trauma.  Eyes:  No conjunctival pallor. No scleral icterus. Ears:  Unable to fully assess hearing but  does not seem to have hearing impairment.  Nose:  No discharge or congestion Mouth:  Moist, pink, clear oropharynx. Tongue midline. Neck:  No mass. No JVD. No TMG Lungs:  Clear bilaterally. No labored breathing or cough. Heart: RRR. No MRG. S1, S2 present Abdomen:  Within, soft. Not tender or distended. Bowel sounds present slightly hypoactive. No HSM, masses, hernias, bruits.   Rectal: did not perform rectal exam   Musc/Skeltl: no joint erythema, swelling or gross deformities. Extremities:  No CCE. Pedal capillary refill brisk.  Neurologic:  Follows commands. Moves all 4 limbs. No tremor. Not able to ask questions as to orientation but seems appropriate. Skin:  No rashes, no sores. Nodes:  No cervical adenopathy   Psych:  Bland affect. Calm. Cooperative.  Intake/Output from previous day: 10/14 0701 - 10/15 0700 In: 1100 [IV Piggyback:1100] Out: -  Intake/Output this shift: Total I/O In: -  Out: 240 [Urine:240]  LAB RESULTS:  Recent Labs  04/10/17 2120 04/11/17 0650  WBC 12.3* 9.1  HGB 9.4* 8.6*  HCT 31.2* 28.9*  PLT 402* 368   BMET Lab Results  Component Value Date   NA 137 04/11/2017   NA 135 04/10/2017   NA 137 03/06/2017   K 3.5 04/11/2017   K 3.7 04/10/2017   K 3.7 03/06/2017   CL 100 (L) 04/11/2017   CL 96 (L) 04/10/2017   CL 107 03/06/2017   CO2 29 04/11/2017   CO2 29 04/10/2017   CO2 24 03/06/2017   GLUCOSE 113 (H) 04/11/2017   GLUCOSE 147 (H) 04/10/2017   GLUCOSE 125 (H) 03/06/2017   BUN 34 (H) 04/11/2017   BUN 36 (H) 04/10/2017   BUN 12 03/06/2017   CREATININE 1.39 (H) 04/11/2017   CREATININE 1.38 (H) 04/10/2017   CREATININE 1.20 03/06/2017   CALCIUM 8.1 (L) 04/11/2017   CALCIUM 8.4 (L) 04/10/2017   CALCIUM 8.1 (L) 03/06/2017   LFT  Recent Labs  04/10/17 2120  PROT 6.4*  ALBUMIN 3.1*  AST 15  ALT 11*  ALKPHOS 62  BILITOT 0.7   PT/INR Lab Results  Component Value Date   INR 1.12 04/10/2017   INR 1.13 03/04/2017   Hepatitis  Panel No results for input(s): HEPBSAG, HCVAB, HEPAIGM, HEPBIGM in the last 72 hours. C-Diff No components found for: CDIFF Lipase     Component Value Date/Time   LIPASE 36 04/10/2017 2120    Drugs of Abuse  No results found for: LABOPIA, COCAINSCRNUR, LABBENZ, AMPHETMU, THCU, LABBARB   RADIOLOGY STUDIES: Dg Chest 2 View  Result Date: 04/10/2017 CLINICAL DATA:  Abdominal pain EXAM: CHEST  2 VIEW COMPARISON:  03/03/2017 FINDINGS: Normal cardiac silhouette with ectatic aorta. Fine interstitial lung disease not changed. Remote LEFT rib fractures. No pulmonary edema. No pneumothorax. No focal infiltrate. IMPRESSION: No acute cardiopulmonary process. Electronically Signed   By: Suzy Bouchard M.D.   On: 04/10/2017 21:12     IMPRESSION:   *  Acute exacerbation of chronic nausea and vomiting.  Dark emesis. Known severe reflux esophagitis seen as recently as upper endoscopy 5 weeks ago. Suspicion on the part of the family that the patient is not compliant with medical regimen    *  Microcytic anemia.  FOBT positive.  *  History of refractory hiccups. Not currently as big an issue as in the past.   PLAN:     *  MRI brain ordered.  Scheduled Zofran and Carafate.  Head of bed at at least 45 degrees.  BID IV Protonix as doing.     *  Spoke with the patient's son, Manem.  He has given verbal approval to both myself and the nurse for upper endoscopy if that's what Dr. Ardis Hughs decides to pursue.   Azucena Freed  04/11/2017, 9:33 AM Pager:  [phone]  ________________________________________________________________________  [facility] Heck[facility] MD note:  I personally examined the patient, reviewed the data and agree with the assessment and plan described above. He had an EGD 1 month ago and that does not need to be repeated now. It does not sound like he is taking his medicines correctly and so I'm not surprised that he is still bothered. He had severe ulcerative esophagitis a month ago, he  really needs to be taking PPI twice daily and H2 blocker at bedtime every night.  Will change to that oral regimen tomorrow AM if he starts to tolerate clears.  The severe esophagitis MAY contribute to his hiccups. We've ordered a brain MRI to see if any other explanation for his nasuea (was planned as an outpatient).  His family has asked that he go to SNF following this admission since they don't think he is caring for himself well at home, unsupervised.   Owens Loffler, MD [facility] Pager [phone]

## 2017-04-11 NOTE — ED Notes (Signed)
Called to room by patient, pt states "vomitng" "vomiting" and holding emesis bag. Emesis noted to be empty. Zofran released and given.  Will continue to monitor

## 2017-04-11 NOTE — Telephone Encounter (Signed)
Patient has been admitted with a GI bleed.  Manish, his son, wanted you to be aware.  He would like his dad to be discharged to a rehab facility upon discharge.  He doesn't feel his father is able to properly care for himself at home.  He is not taking his medications or eating properly. 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 Azucena Freed, PA notified.

## 2017-04-12 ENCOUNTER — Inpatient Hospital Stay (HOSPITAL_COMMUNITY): Payer: Medicare Other

## 2017-04-12 DIAGNOSIS — K2100 Gastro-esophageal reflux disease with esophagitis, without bleeding: Secondary | ICD-10-CM

## 2017-04-12 LAB — CBC
HCT: 24.6 % — ABNORMAL LOW (ref 39.0–52.0)
HEMATOCRIT: 30.9 % — AB (ref 39.0–52.0)
HEMOGLOBIN: 7.3 g/dL — AB (ref 13.0–17.0)
HEMOGLOBIN: 8.9 g/dL — AB (ref 13.0–17.0)
MCH: 21.8 pg — ABNORMAL LOW (ref 26.0–34.0)
MCH: 21.3 pg — ABNORMAL LOW (ref 26.0–34.0)
MCHC: 29.7 g/dL — ABNORMAL LOW (ref 30.0–36.0)
MCHC: 28.8 g/dL — ABNORMAL LOW (ref 30.0–36.0)
MCV: 73.4 fL — ABNORMAL LOW (ref 78.0–100.0)
MCV: 74.1 fL — AB (ref 78.0–100.0)
Platelets: 330 10*3/uL (ref 150–400)
Platelets: 392 10*3/uL (ref 150–400)
RBC: 3.35 MIL/uL — ABNORMAL LOW (ref 4.22–5.81)
RBC: 4.17 MIL/uL — AB (ref 4.22–5.81)
RDW: 16 % — ABNORMAL HIGH (ref 11.5–15.5)
RDW: 16.4 % — AB (ref 11.5–15.5)
WBC: 5.7 10*3/uL (ref 4.0–10.5)
WBC: 7.4 10*3/uL (ref 4.0–10.5)

## 2017-04-12 MED ORDER — SODIUM CHLORIDE 0.9 % IV SOLN
25.0000 mg | Freq: Once | INTRAVENOUS | Status: AC
  Administered 2017-04-12: 25 mg via INTRAVENOUS
  Filled 2017-04-12: qty 2

## 2017-04-12 MED ORDER — ONDANSETRON HCL 4 MG PO TABS
4.0000 mg | ORAL_TABLET | Freq: Four times a day (QID) | ORAL | Status: DC | PRN
  Administered 2017-04-13 (×2): 4 mg via ORAL
  Filled 2017-04-12 (×2): qty 1

## 2017-04-12 MED ORDER — NA FERRIC GLUC CPLX IN SUCROSE 12.5 MG/ML IV SOLN
125.0000 mg | Freq: Every day | INTRAVENOUS | Status: AC
  Administered 2017-04-12 – 2017-04-13 (×2): 125 mg via INTRAVENOUS
  Filled 2017-04-12 (×2): qty 10

## 2017-04-12 MED ORDER — FAMOTIDINE 10 MG PO TABS
10.0000 mg | ORAL_TABLET | Freq: Every day | ORAL | Status: DC
  Administered 2017-04-12: 10 mg via ORAL
  Filled 2017-04-12: qty 1

## 2017-04-12 MED ORDER — ENSURE ENLIVE PO LIQD
237.0000 mL | Freq: Two times a day (BID) | ORAL | Status: DC
  Administered 2017-04-12 – 2017-04-13 (×2): 237 mL via ORAL

## 2017-04-12 MED ORDER — PANTOPRAZOLE SODIUM 40 MG PO TBEC
40.0000 mg | DELAYED_RELEASE_TABLET | Freq: Two times a day (BID) | ORAL | Status: DC
Start: 2017-04-12 — End: 2017-04-14
  Administered 2017-04-12 – 2017-04-13 (×3): 40 mg via ORAL
  Filled 2017-04-12 (×3): qty 1

## 2017-04-12 NOTE — Progress Notes (Signed)
PROGRESS NOTE    Logan [name]  TGG:[id number] DOB: 18-Aug-1934 DOA: 04/10/2017 PCP: Allie Bossier, MD   Brief Narrative: 82 year old male, hindi speaking with history of a stroke, dyslipidemia, upper GI bleeding due to esophagitis and bleeding esophageal ulcer presented with unable to take food and medication down associated with hematemesis, nausea and vomiting. Patient was started on IV Protonix and admitted for further evaluation.  Assessment & Plan:  #  Hematemesis: No active bleeding today.Drop in hemoglobin noted ? Dilution. Repeating CBC. -tolerating clear liquid diet, changed to Protonix oral. Continue Carafate. GI consult appreciated.currently on IV fluid, plan to lower the rate when patient has good oral intake.  #Prior history of a stroke and possible physical deconditioning: Family interested in nursing home placement. PT, OT and social worker consulted. -MRI brain ordered by GI  #Chronic kidney disease stage III: Serum creatinine level around baseline. Monitor BMP. Avoid nephrotoxins.  #chronic anemia likely multifactorial including iron deficiency anemia and GI loss. Patient with low iron, order  IV iron two doses. Monitor CBC.   # decreased oral intake: Dietary evaluation.  DVT prophylaxis:SCDs Code Status:full code Family Communication:no family at bedside Disposition Plan:currently admitted, likely discharge to a skilled nursing facility in 1-2 days.    Consultants:   gI  Procedures:none Antimicrobials:none  Subjective: Seen and examined at bedside.has mild nausea and difficulty swallowing.Feels better than yesterday. No nausea vomitingabdominal pain.  Objective: Vitals:   04/11/17 1536 04/11/17 1959 04/12/17 0532 04/12/17 1103  BP:  122/75 130/83   Pulse:  82 85   Resp:   16   Temp:  98.3 F (36.8 C) 98.2 F (36.8 C)   TempSrc:  Oral    SpO2:  100% 97% 96%  Weight: 58.8 kg (129 lb 9.6 oz)     Height: 5\' 9"  (1.753 m)       Intake/Output  Summary (Last 24 hours) at 04/12/17 1320 Last data filed at 04/12/17 1109  Gross per 24 hour  Intake             1255 ml  Output             1280 ml  Net              -25 ml   Filed Weights   04/11/17 1536  Weight: 58.8 kg (129 lb 9.6 oz)    Examination:  General exam: not in distress Respiratory system: clear bilateral, no wheezing or crackle. Cardiovascular system: regular rate rhythm S1 is normal. Gastrointestinal system: abdomen soft, nontender, nondistended. Bowel sound positive. Skin: No rashes, lesions or ulcers neurology: alert, awake , following commands    Data Reviewed: I have personally reviewed following labs and imaging studies  CBC:  Recent Labs Lab 04/10/17 2120 04/11/17 0650 04/12/17 0725 04/12/17 1131  WBC 12.3* 9.1 5.7 7.4  NEUTROABS 9.4*  --   --   --   HGB 9.4* 8.6* 7.3* 8.9*  HCT 31.2* 28.9* 24.6* 30.9*  MCV 72.1* 72.6* 73.4* 74.1*  PLT 402* 368 330 703   Basic Metabolic Panel:  Recent Labs Lab 04/10/17 2120 04/11/17 0650  NA 135 137  K 3.7 3.5  CL 96* 100*  CO2 29 29  GLUCOSE 147* 113*  BUN 36* 34*  CREATININE 1.38* 1.39*  CALCIUM 8.4* 8.1*   GFR: Estimated Creatinine Clearance: 35.8 mL/min (A) (by C-G formula based on SCr of 1.39 mg/dL (H)). Liver Function Tests:  Recent Labs Lab 04/10/17 2120  AST 15  ALT 11*  ALKPHOS 62  BILITOT 0.7  PROT 6.4*  ALBUMIN 3.1*    Recent Labs Lab 04/10/17 2120  LIPASE 36   No results for input(s): AMMONIA in the last 168 hours. Coagulation Profile:  Recent Labs Lab 04/10/17 2120  INR 1.12   Cardiac Enzymes: No results for input(s): CKTOTAL, CKMB, CKMBINDEX, TROPONINI in the last 168 hours. BNP (last 3 results) No results for input(s): PROBNP in the last 8760 hours. HbA1C: No results for input(s): HGBA1C in the last 72 hours. CBG: No results for input(s): GLUCAP in the last 168 hours. Lipid Profile: No results for input(s): CHOL, HDL, LDLCALC, TRIG, CHOLHDL, LDLDIRECT in  the last 72 hours. Thyroid Function Tests: No results for input(s): TSH, T4TOTAL, FREET4, T3FREE, THYROIDAB in the last 72 hours. Anemia Panel:  Recent Labs  04/11/17 1528  FERRITIN 7*  TIBC 301  IRON 10*   Sepsis Labs: No results for input(s): PROCALCITON, LATICACIDVEN in the last 168 hours.  No results found for this or any previous visit (from the past 240 hour(s)).       Radiology Studies: Dg Chest 2 View  Result Date: 04/10/2017 CLINICAL DATA:  Abdominal pain EXAM: CHEST  2 VIEW COMPARISON:  03/03/2017 FINDINGS: Normal cardiac silhouette with ectatic aorta. Fine interstitial lung disease not changed. Remote LEFT rib fractures. No pulmonary edema. No pneumothorax. No focal infiltrate. IMPRESSION: No acute cardiopulmonary process. Electronically Signed   By: Suzy Bouchard M.D.   On: 04/10/2017 21:12        Scheduled Meds: . baclofen  5 mg Oral TID  . feeding supplement (ENSURE ENLIVE)  237 mL Oral BID BM  . ondansetron  4 mg Oral Q8H  . pantoprazole  40 mg Oral BID  . sucralfate  1 g Oral Q6H   Continuous Infusions: . dextrose 5 % and 0.9 % NaCl with KCl 40 mEq/L 75 mL/hr at 04/12/17 0400     LOS: 2 days    Karyss Frese Tanna Furry, MD [facility] Pager [phone]  If 7PM-7AM, please contact night-coverage [email or web address] Password [email or web address] 04/12/2017, 1:20 PM

## 2017-04-12 NOTE — Progress Notes (Signed)
Daily Rounding Note  04/12/2017, 8:39 AM  LOS: 2 days   SUBJECTIVE:   Chief complaint: nausea, vomiting and epigastric pain.  All improved.  No n/v currently, some nausea yesterday PM.  Tolerating clears.  Pain minimal and improved      OBJECTIVE:         Vital signs in last 24 hours:    Temp:  [98.2 F (36.8 C)-98.3 F (36.8 C)] 98.2 F (36.8 C) (10/16 0532) Pulse Rate:  [68-85] 85 (10/16 0532) Resp:  [16] 16 (10/16 0532) BP: (122-130)/(75-90) 130/83 (10/16 0532) SpO2:  [97 %-100 %] 97 % (10/16 0532) Weight:  [58.8 kg (129 lb 9.6 oz)] 58.8 kg (129 lb 9.6 oz) (10/15 1536)   Filed Weights   04/11/17 1536  Weight: 58.8 kg (129 lb 9.6 oz)   General: frail. Looks better   Heart: RRR Chest: clear bil.  No cough or dyspnea Abdomen: soft, NT, ND.  Active BS  Extremities: no CCE Neuro/Psych:  Alert, appropriate.  Moves all 4s, no tremor  Intake/Output from previous day: 10/15 0701 - 10/16 0700 In: 1135 [P.O.:120; I.V.:1015] Out: 1120 [Urine:1120]  Intake/Output this shift: No intake/output data recorded.  Lab Results:  Recent Labs  04/10/17 2120 04/11/17 0650 04/12/17 0725  WBC 12.3* 9.1 5.7  HGB 9.4* 8.6* 7.3*  HCT 31.2* 28.9* 24.6*  PLT 402* 368 330   BMET  Recent Labs  04/10/17 2120 04/11/17 0650  NA 135 137  K 3.7 3.5  CL 96* 100*  CO2 29 29  GLUCOSE 147* 113*  BUN 36* 34*  CREATININE 1.38* 1.39*  CALCIUM 8.4* 8.1*   LFT  Recent Labs  04/10/17 2120  PROT 6.4*  ALBUMIN 3.1*  AST 15  ALT 11*  ALKPHOS 62  BILITOT 0.7   PT/INR  Recent Labs  04/10/17 2120  LABPROT 14.3  INR 1.12   Hepatitis Panel No results for input(s): HEPBSAG, HCVAB, HEPAIGM, HEPBIGM in the last 72 hours.  Studies/Results: Dg Chest 2 View  Result Date: 04/10/2017 CLINICAL DATA:  Abdominal pain EXAM: CHEST  2 VIEW COMPARISON:  03/03/2017 FINDINGS: Normal cardiac silhouette with ectatic aorta. Fine  interstitial lung disease not changed. Remote LEFT rib fractures. No pulmonary edema. No pneumothorax. No focal infiltrate. IMPRESSION: No acute cardiopulmonary process. Electronically Signed   By: Suzy Bouchard M.D.   On: 04/10/2017 21:12   Scheduled Meds: . baclofen  5 mg Oral TID  . ondansetron  4 mg Oral Q8H  . pantoprazole  40 mg Intravenous Q12H  . sucralfate  1 g Oral Q6H   Continuous Infusions: . dextrose 5 % and 0.9 % NaCl with KCl 40 mEq/L 75 mL/hr at 04/12/17 0400   PRN Meds:.acetaminophen **OR** acetaminophen, bisacodyl, chlorproMAZINE, sodium phosphate   ASSESMENT:   *  Acute on chronic nausea and vomiting.  Dark emesis. Known severe reflux esophagitis seen as recently as upper endoscopy 5 weeks ago. Suspicion that patient is not compliant with medical regimen. BID IV PPI, scheduled Zofran, scheduled Carafate in place.     *  Acute on chronic microcytic anemia.  FOBT positive.  Hgb 9.4 >> 8.6 >> 7.3.  Anemia studies ordered.    *  History of refractory hiccups. Not currently as big an issue as in the past.   *  AKI.    PLAN   *  Full liquid diet.  Oral BID Protonix.  Continue Carafate and scheduled Zofran.  Consider switching  to prn Zofran if N/V continues resolved in next several hours.  Keep HOB elevated and get him out of bed to chair for bulk of the day.  Reserve transfusion for Hgb <=7 or if symptomatic which he is not.      Azucena Freed  04/12/2017, 8:39 AM Pager: [phone]  ________________________________________________________________________  Velora Heckler GI MD note:  I personally examined the patient, reviewed the data and agree with the assessment and plan described above.  Repeat Hb was 8.9, the 7.3 must have been a spurious result.  OK to change to PPI orally twice daily (20-30 min prior to breakfst and dinner meals) and zofran PRN.  H2 blocker should remain at bedtime nightly. Will advance diet as tolerated. He is OK for d/c from my perspective if  he tolerates solid diet.  Appreciate that he is going to SNF, this will be helpful to ensure he is taking his prescribed meds regularly.   Please call or page with any further questions or concerns.    Owens Loffler, MD [facility] Pager [phone]

## 2017-04-12 NOTE — Progress Notes (Addendum)
Initial Nutrition Assessment  DOCUMENTATION CODES:   Non-severe (moderate) malnutrition in context of chronic illness  INTERVENTION:    Vanilla Ensure Enlive po BID, each supplement provides 350 kcal and 20 grams of protein  NUTRITION DIAGNOSIS:   Malnutrition (moderate) related to chronic illness (CKD, anemia, nausea & vomiting) as evidenced by moderate depletion of body fat, moderate depletions of muscle mass  GOAL:   Patient will meet greater than or equal to 90% of their needs  MONITOR:   PO intake, Supplement acceptance, Labs, Weight trends, Skin, I & O's  REASON FOR ASSESSMENT:   Consult Assessment of nutrition requirement/status  ASSESSMENT:   82 yo Male with PMH significant of upper GI bleeding in June (grade A esophagitis) and September (bleeding esophageal ulcers); CVA; and HLD presenting with recurrent hematemesis.  RD spoke with pt's son, Manish. He reports pt has not eaten anything for the last 5 days. Vomiting brownish liquid PTA. Also reveals pt has been losing weight. Unable to quantify amount or time frame.  Typically drinks 1 (one) Ensure supplement per day. Will order during acute hospitalization. Medications reviewed and include Protonix and Dulcolax. Labs reviewed. Iron 10 (L). Ferritin 7 (L).  Nutrition-Focused physical exam completed. Findings are moderate fat depletion, moderate muscle depletion, and no edema.   Diet Order:  Diet full liquid Room service appropriate? Yes; Fluid consistency: Thin  Skin:  Reviewed, no issues  Last BM:  N/A  Height:   Ht Readings from Last 1 Encounters:  04/11/17 5\' 9"  (1.753 m)   Weight:   Wt Readings from Last 1 Encounters:  04/11/17 129 lb 9.6 oz (58.8 kg)   Ideal Body Weight:  73 kg  BMI:  Body mass index is 19.14 kg/m.  Estimated Nutritional Needs:   Kcal:  1600-1800  Protein:  80-95 gm  Fluid:  1.6-1.8 L  EDUCATION NEEDS:   No education needs identified at this time  Arthur Holms, RD, LDN Pager #: [phone] After-Hours Pager #: [phone]

## 2017-04-12 NOTE — NC FL2 (Signed)
[place] LEVEL OF CARE SCREENING TOOL     IDENTIFICATION  Patient Name: Logan [name] Birthdate: [birth year] Sex: male Admission Date (Current Location): 04/10/2017  [facility] and [place] Number:  Herbalist and Address:  [facility] [place] [place] [address] [place], [place] [address]      Provider Number: [id number]  Attending Physician Name and Address:  Rosita Fire, MD  Relative Name and Phone Number:       Current Level of Care: Hospital Recommended Level of Care: [place] Prior Approval Number:    Date Approved/Denied:   PASRR Number: [id number]  Discharge Plan: SNF    Current Diagnoses: Patient Active Problem List   Diagnosis Date Noted  . Gastroesophageal reflux disease with esophagitis   . Hyperglycemia 04/11/2017  . Malnutrition of mild degree ([place]) 04/11/2017  . Intractable vomiting with nausea   . Physical deconditioning   . Iron deficiency anemia   . Upper GI bleed   . Esophageal ulcer with bleeding   . Cardiomegaly   . Chronic diastolic CHF (congestive heart failure) ([place])   . Intractable hiccups   . Cerebrovascular accident (CVA) ([place])   . SVT (supraventricular tachycardia) ([place])   . Hematemesis 03/03/2017    Orientation RESPIRATION BLADDER Height & Weight     Self, Time, Situation, Place  Normal Continent Weight: 129 lb 9.6 oz (58.8 kg) Height:  5\' 9"  (175.3 cm)  BEHAVIORAL SYMPTOMS/MOOD NEUROLOGICAL BOWEL NUTRITION STATUS    Convulsions/Seizures Continent Diet (See DC Summary)  AMBULATORY STATUS COMMUNICATION OF NEEDS Skin   Limited Assist Verbally (Needs Interpreter, Hindu) Normal                       Personal Care Assistance Level of Assistance  Dressing, Feeding, Bathing Bathing Assistance: Limited assistance Feeding assistance: Limited assistance Dressing Assistance: Limited assistance     Functional Limitations Info  Speech     Speech Info: Adequate (Will  Need Interpreter)    [place]  PT (By licensed PT), OT (By licensed OT)     PT Frequency: 3x week OT Frequency: 2x week            Contractures Contractures Info: Not present    Additional Factors Info  Code Status, Allergies Code Status Info: Full code Allergies Info: No Known Allergies           Current Medications (04/12/2017):  This is the current hospital active medication list Current Facility-Administered Medications  Medication Dose Route Frequency Provider Last Rate Last Dose  . acetaminophen (TYLENOL) tablet 650 mg  650 mg Oral Q6H PRN Karmen Bongo, MD       Or  . acetaminophen (TYLENOL) suppository 650 mg  650 mg Rectal Q6H PRN Karmen Bongo, MD      . baclofen (LIORESAL) tablet 5 mg  5 mg Oral TID Karmen Bongo, MD   5 mg at 04/12/17 4166  . bisacodyl (DULCOLAX) suppository 10 mg  10 mg Rectal Daily PRN Karmen Bongo, MD      . chlorproMAZINE (THORAZINE) tablet 25 mg  25 mg Oral TID PRN Karmen Bongo, MD      . dextrose 5 % and 0.9 % NaCl with KCl 40 mEq/L infusion   Intravenous Continuous Rosita Fire, MD 75 mL/hr at 04/12/17 0400    . famotidine (PEPCID) tablet 10 mg  10 mg Oral QHS Milus Banister, MD      .  feeding supplement (ENSURE ENLIVE) (ENSURE ENLIVE) liquid 237 mL  237 mL Oral BID BM Rosita Fire, MD   237 mL at 04/12/17 1328  . ferric gluconate (NULECIT) 125 mg in sodium chloride 0.9 % 100 mL IVPB  125 mg Intravenous Daily Rosita Fire, MD      . ondansetron [facility]) tablet 4 mg  4 mg Oral Q6H PRN Milus Banister, MD      . pantoprazole (PROTONIX) EC tablet 40 mg  40 mg Oral BID Vena Rua, PA-C   40 mg at 04/12/17 4287  . sodium phosphate (FLEET) 7-19 GM/118ML enema 1 enema  1 enema Rectal Once PRN Karmen Bongo, MD      . sucralfate (CARAFATE) 1 GM/10ML suspension 1 g  1 g Oral Q6H Vena Rua, PA-C   1 g at 04/12/17 1138     Discharge Medications: Please see discharge  summary for a list of discharge medications.  Relevant Imaging Results:  Relevant Lab Results:   Additional Information [phone]  Normajean Baxter, LCSW

## 2017-04-12 NOTE — Clinical Social Work Note (Signed)
Clinical Social Work Assessment  Patient Details  Name: Logan [name] MRN: [id number] Date of Birth: 01/05/1935  Date of referral:  04/12/17               Reason for consult:  Facility Placement                Permission sought to share information with:  Facility Art therapist granted to share information::  Yes, Release of Information Signed  Name::     Karell Tukes  Agency::  SNF  Relationship::  son  Contact Information:     Housing/Transportation Living arrangements for the past 2 months:  Single Family Home Source of Information:  Adult Children, Spouse Patient Interpreter Needed:  Other (Comment Required) (Hindu) Criminal Activity/Legal Involvement Pertinent to Current Situation/Hospitalization:  No - Comment as needed Significant Relationships:  Adult Children, Other Family Members, Spouse Lives with:  Adult Children, Spouse Do you feel safe going back to the place where you live?  No Need for family participation in patient care:  Yes (Comment)  Care giving concerns:  Pt from home with spouse and children. Per son, mother and son assist with some ADL's. Pt can fee himself independently, however remains in bed due to pain and weakness. Pt uses a walker at home when he ambulates. This is the second time patient has been to hospital for same condition and son wants him to improve medically. Son amenable to SNF when patient is medically stable. Pt not safe to return home at this time.   Pt speaks Hindu and will need interpreter.  Social Worker assessment / plan:  2  Employment status:  Retired Forensic scientist:  [employer] PT Recommendations:  [place] / Referral to community resources:  [place]  Patient/Family's Response to care:  Family appreciative of CSW assistance with SNF placement and options. No other issues identified at this time.  Patient/Family's Understanding of and Emotional Response to  Diagnosis, Current Treatment, and Prognosis:  Family has good understanding of diagnosis, current treatment and prognosis and hopeful that with this hospitalization, the impairment will be addressed and improvement will occur.  CSW will assist with transition to SNF when medically ready. No other issues or concerns identified.  Emotional Assessment Appearance:  Appears stated age Attitude/Demeanor/Rapport:   (Cooperative) Affect (typically observed):  Accepting, Appropriate Orientation:  Oriented to Self, Oriented to Place, Oriented to  Time, Oriented to Situation Alcohol / Substance use:  Not Applicable Psych involvement (Current and /or in the community):  No (Comment)  Discharge Needs  Concerns to be addressed:  Care Coordination Readmission within the last 30 days:  No Current discharge risk:  Physical Impairment, Dependent with Mobility Barriers to Discharge:  No Barriers Identified   Normajean Baxter, LCSW 04/12/2017, 5:02 PM

## 2017-04-12 NOTE — Evaluation (Signed)
Physical Therapy Evaluation Patient Details Name: Logan [name] MRN: [id number] DOB: 10/20/1934 Today's Date: 04/12/2017   History of Present Illness   82 y.o.Hindi speaking male with medical history significant of upper GI bleeding in June (grade A esophagitis) and September (bleeding esophageal ulcers); CVA; and HLD presenting with recurrent hematemesis.  Clinical Impression  Pt with flat affect and at times pt and interpreter were slow to respond to questions or statements. Pt with generalized weakness, difficulty with transfers, gait and functional mobility with family who works and cannot provide assistance at home Pt will benefit from acute therapy to maximize mobility, gait and independence to decrease burden of care and fall risk.  Pt stating he stays in the bed at home because he cannot walk far and educated for benefit and need of mobility for strength and function. Pt reports 3-4 falls in the last year.     Follow Up Recommendations SNF;Supervision/Assistance - 24 hour    Equipment Recommendations  Hospital bed    Recommendations for Other Services       Precautions / Restrictions Precautions Precautions: Fall      Mobility  Bed Mobility Overal bed mobility: Needs Assistance Bed Mobility: Supine to Sit     Supine to sit: Min assist;HOB elevated     General bed mobility comments: pt able to roll to EOB but even with cues to come to sitting pt not following commands and required min assist to elevate trunk and scoot to EOB   Transfers Overall transfer level: Needs assistance   Transfers: Sit to/from Stand Sit to Stand: Min guard         General transfer comment: pt required 4 trials before able to stand from bed unassisted  Ambulation/Gait Ambulation/Gait assistance: Min assist Ambulation Distance (Feet): 15 Feet Assistive device: Rolling walker (2 wheeled) Gait Pattern/deviations: Step-to pattern;Decreased stride length;Trunk flexed   Gait velocity  interpretation: Below normal speed for age/gender General Gait Details: pt with flexed trunk with progressive external rotation of RLE throughout gait and with increased gait distance began dragging RLE behind him. Pt denied further distance due to fatigue  Stairs            Wheelchair Mobility    Modified Rankin (Stroke Patients Only)       Balance Overall balance assessment: Needs assistance   Sitting balance-Leahy Scale: Fair       Standing balance-Leahy Scale: Poor                               Pertinent Vitals/Pain Pain Assessment: No/denies pain    Home Living Family/patient expects to be discharged to:: Private residence Living Arrangements: Spouse/significant other Available Help at Discharge: Family;Available PRN/intermittently Type of Home: House Home Access: Stairs to enter   [employer] of Steps: "a few" Home Layout: One level Home Equipment: Walker - 2 wheels;Bedside commode      Prior Function Level of Independence: Needs assistance   Gait / Transfers Assistance Needed: pt states he walks very limited distance approximately 10' in home with RW  ADL's / Homemaking Assistance Needed: sponge bathes and dresses on his own per pt. Family does the cooking and homemaking        Hand Dominance        Extremity/Trunk Assessment   Upper Extremity Assessment Upper Extremity Assessment: Generalized weakness    Lower Extremity Assessment Lower Extremity Assessment: Generalized weakness    Cervical / Trunk Assessment Cervical /  Trunk Assessment: Kyphotic  Communication   Communication: Interpreter utilized (Hindu)  Cognition Arousal/Alertness: Awake/alert Behavior During Therapy: Flat affect Overall Cognitive Status: No family/caregiver present to determine baseline cognitive functioning Area of Impairment: Following commands;Safety/judgement                       Following Commands: Follows one step commands  with increased time Safety/Judgement: Decreased awareness of safety;Decreased awareness of deficits            General Comments      Exercises     Assessment/Plan    PT Assessment Patient needs continued PT services  PT Problem List Decreased strength;Decreased mobility;Decreased safety awareness;Decreased activity tolerance;Decreased balance;Decreased knowledge of use of DME;Decreased cognition       PT Treatment Interventions Gait training;Therapeutic exercise;Patient/family education;DME instruction;Therapeutic activities;Balance training;Functional mobility training;Stair training;Cognitive remediation    PT Goals (Current goals can be found in the Care Plan section)  Acute Rehab PT Goals Patient Stated Goal: go home PT Goal Formulation: With patient Time For Goal Achievement: 04/26/17 Potential to Achieve Goals: Fair    Frequency Min 3X/week   Barriers to discharge Decreased caregiver support      Co-evaluation PT/OT/SLP Co-Evaluation/Treatment: Yes Reason for Co-Treatment: Complexity of the patient's impairments (multi-system involvement);Necessary to address cognition/behavior during functional activity PT goals addressed during session: Mobility/safety with mobility;Proper use of DME         AM-PAC PT "6 Clicks" Daily Activity  Outcome Measure Difficulty turning over in bed (including adjusting bedclothes, sheets and blankets)?: A Lot Difficulty moving from lying on back to sitting on the side of the bed? : Unable Difficulty sitting down on and standing up from a chair with arms (e.g., wheelchair, bedside commode, etc,.)?: A Lot Help needed moving to and from a bed to chair (including a wheelchair)?: A Little Help needed walking in hospital room?: A Little Help needed climbing 3-5 steps with a railing? : A Lot 6 Click Score: 13    End of Session Equipment Utilized During Treatment: Gait belt Activity Tolerance: Patient tolerated treatment well Patient  left: in chair;with call bell/phone within reach;with chair alarm set;with nursing/sitter in room Nurse Communication: Mobility status;Precautions PT Visit Diagnosis: Other abnormalities of gait and mobility (R26.89);Difficulty in walking, not elsewhere classified (R26.2);Muscle weakness (generalized) (M62.81)    Time: 1030-1101 PT Time Calculation (min) (ACUTE ONLY): 31 min   Charges:   PT Evaluation $PT Eval Moderate Complexity: 1 Mod     PT G Codes:        Elwyn Reach, PT [phone]   [place] 04/12/2017, 11:10 AM

## 2017-04-12 NOTE — Evaluation (Signed)
Occupational Therapy Evaluation Patient Details Name: Logan [name] MRN: [id number] DOB: 01-17-1935 Today's Date: 04/12/2017    History of Present Illness  82 y.o.Hindi speaking male with medical history significant of upper GI bleeding in June (grade A esophagitis) and September (bleeding esophageal ulcers); CVA; and HLD presenting with recurrent hematemesis.   Clinical Impression   Pt reports walking household distances with a walker and performing sponge bathing and dressing independently. He was dependent in IADL. No family available to confirm pt's PLOF. Pt presents with generalized weakness, impaired mobility and standing balance and impaired cognition. Pt with report of poor "stamina" and falls. VSS throughout session. Will follow acutely. Recommending post acute rehab in SNF.     Follow Up Recommendations  SNF;Supervision/Assistance - 24 hour    Equipment Recommendations       Recommendations for Other Services       Precautions / Restrictions Precautions Precautions: Fall      Mobility Bed Mobility Overal bed mobility: Needs Assistance Bed Mobility: Supine to Sit     Supine to sit: Min assist;HOB elevated     General bed mobility comments: pt able to roll to EOB but even with cues to come to sitting pt not following commands and required min assist to elevate trunk and scoot to EOB   Transfers Overall transfer level: Needs assistance Equipment used: Rolling walker (2 wheeled) Transfers: Sit to/from Stand Sit to Stand: Min guard         General transfer comment: pt required 4 trials before able to stand from bed unassisted    Balance Overall balance assessment: Needs assistance   Sitting balance-Leahy Scale: Fair       Standing balance-Leahy Scale: Poor                             ADL either performed or assessed with clinical judgement   ADL Overall ADL's : Needs assistance/impaired Eating/Feeding: Independent;Bed level   Grooming:  Set up;Sitting   Upper Body Bathing: Minimal assistance;Sitting   Lower Body Bathing: Minimal assistance;Sit to/from stand   Upper Body Dressing : Minimal assistance;Sitting   Lower Body Dressing: Minimal assistance;Sit to/from stand   Toilet Transfer: Moderate assistance;RW Toilet Transfer Details (indicate cue type and reason): stood and used urinal Toileting- Clothing Manipulation and Hygiene: Maximal assistance (standing)       Functional mobility during ADLs: Rolling walker;Minimal assistance General ADL Comments: Pt repeatedly asking to go back to bed.     Vision Patient Visual Report: No change from baseline       Perception     Praxis      Pertinent Vitals/Pain Pain Assessment: No/denies pain     Hand Dominance Right   Extremity/Trunk Assessment Upper Extremity Assessment Upper Extremity Assessment: Generalized weakness   Lower Extremity Assessment Lower Extremity Assessment: Defer to PT evaluation   Cervical / Trunk Assessment Cervical / Trunk Assessment: Kyphotic   Communication Communication Communication: Interpreter utilized;Prefers language other than English (Hindi)   Cognition Arousal/Alertness: Awake/alert Behavior During Therapy: Flat affect Overall Cognitive Status: No family/caregiver present to determine baseline cognitive functioning Area of Impairment: Following commands;Safety/judgement                       Following Commands: Follows one step commands with increased time Safety/Judgement: Decreased awareness of safety;Decreased awareness of deficits     General Comments: questionable historian   General Comments  Exercises     Shoulder Instructions      Home Living Family/patient expects to be discharged to:: Private residence Living Arrangements: Spouse/significant other Available Help at Discharge: Family;Available PRN/intermittently Type of Home: House Home Access: Stairs to enter [employer]  of Steps: "a few"   Home Layout: One level     Bathroom Shower/Tub: Teacher, early years/pre: Standard     Home Equipment: Environmental consultant - 2 wheels;Bedside commode          Prior Functioning/Environment Level of Independence: Needs assistance  Gait / Transfers Assistance Needed: pt states he walks very limited distance approximately 10' in home with RW ADL's / Homemaking Assistance Needed: sponge bathes and dresses on his own per pt. Family does the cooking and homemaking            OT Problem List: Decreased strength;Decreased activity tolerance;Impaired balance (sitting and/or standing);Decreased cognition;Decreased safety awareness;Decreased knowledge of use of DME or AE      OT Treatment/Interventions: Self-care/ADL training;DME and/or AE instruction;Patient/family education    OT Goals(Current goals can be found in the care plan section) Acute Rehab OT Goals Patient Stated Goal: go home OT Goal Formulation: With patient Time For Goal Achievement: 04/26/17 Potential to Achieve Goals: Good ADL Goals Pt Will Perform Grooming: with supervision;standing (2 activities) Pt Will Perform Upper Body Dressing: with supervision;sitting Pt Will Perform Lower Body Dressing: with supervision;sit to/from stand Pt Will Transfer to Toilet: with supervision;ambulating Pt Will Perform Toileting - Clothing Manipulation and hygiene: with supervision;sit to/from stand  OT Frequency: Min 2X/week   Barriers to D/C: Decreased caregiver support          Co-evaluation   Reason for Co-Treatment: Complexity of the patient's impairments (multi-system involvement);Necessary to address cognition/behavior during functional activity PT goals addressed during session: Mobility/safety with mobility;Proper use of DME        AM-PAC PT "6 Clicks" Daily Activity     Outcome Measure Help from another person eating meals?: None Help from another person taking care of personal grooming?: A  Little Help from another person toileting, which includes using toliet, bedpan, or urinal?: A Lot Help from another person bathing (including washing, rinsing, drying)?: A Lot Help from another person to put on and taking off regular upper body clothing?: A Little Help from another person to put on and taking off regular lower body clothing?: A Little 6 Click Score: 17   End of Session Equipment Utilized During Treatment: Gait belt;Rolling walker Nurse Communication: Mobility status  Activity Tolerance: Patient tolerated treatment well Patient left: in chair;with chair alarm set;with call bell/phone within reach  OT Visit Diagnosis: Unsteadiness on feet (R26.81)                Time: 1030-1100 OT Time Calculation (min): 30 min Charges:  OT General Charges $OT Visit: 1 Visit OT Evaluation $OT Eval Moderate Complexity: 1 Mod G-Codes:     04-29-2017 Nestor Lewandowsky, OTR/L Pager: [phone]  Werner Lean, [place] 04/29/2017, 11:32 AM

## 2017-04-13 DIAGNOSIS — D500 Iron deficiency anemia secondary to blood loss (chronic): Secondary | ICD-10-CM

## 2017-04-13 DIAGNOSIS — E440 Moderate protein-calorie malnutrition: Secondary | ICD-10-CM

## 2017-04-13 LAB — CBC
HCT: 28.3 % — ABNORMAL LOW (ref 39.0–52.0)
Hemoglobin: 8.4 g/dL — ABNORMAL LOW (ref 13.0–17.0)
MCH: 21.6 pg — ABNORMAL LOW (ref 26.0–34.0)
MCHC: 29.7 g/dL — ABNORMAL LOW (ref 30.0–36.0)
MCV: 72.8 fL — ABNORMAL LOW (ref 78.0–100.0)
PLATELETS: 359 10*3/uL (ref 150–400)
RBC: 3.89 MIL/uL — ABNORMAL LOW (ref 4.22–5.81)
RDW: 15.8 % — AB (ref 11.5–15.5)
WBC: 8.5 10*3/uL (ref 4.0–10.5)

## 2017-04-13 MED ORDER — ONDANSETRON 4 MG PO TBDP: 4.0000 mg | ORAL_TABLET | Freq: Four times a day (QID) | ORAL | Status: DC | PRN

## 2017-04-13 MED ORDER — ENSURE ENLIVE PO LIQD: 237.0000 mL | Freq: Two times a day (BID) | ORAL | Status: DC

## 2017-04-13 MED ORDER — FAMOTIDINE 40 MG/5ML PO SUSR: 10.0000 mg | Freq: Every day | ORAL | Status: DC

## 2017-04-13 MED ORDER — SUCRALFATE 1 GM/10ML PO SUSP: 1.0000 g | Freq: Four times a day (QID) | ORAL | Status: DC

## 2017-04-13 MED ORDER — PANTOPRAZOLE SODIUM 40 MG PO PACK: 40.0000 mg | PACK | Freq: Two times a day (BID) | ORAL | Status: DC

## 2017-04-13 MED ORDER — GADOBENATE DIMEGLUMINE 529 MG/ML IV SOLN
10.0000 mL | Freq: Once | INTRAVENOUS | Status: AC | PRN
  Administered 2017-04-13: 10 mL via INTRAVENOUS

## 2017-04-13 NOTE — Clinical Social Work Placement (Signed)
   CLINICAL SOCIAL WORK PLACEMENT  NOTE  Date:  04/13/2017  Patient Details  Name: Logan [name] MRN: [id number] Date of Birth: 19-Jan-1935  Clinical Social Work is seeking post-discharge placement for this patient at the [place] level of care (*CSW will initial, date and re-position this form in  chart as items are completed):  Yes   Patient/family provided with [place] Work Department's list of facilities offering this level of care within the geographic area requested by the patient (or if unable, by the patient's family).  Yes   Patient/family informed of their freedom to choose among providers that offer the needed level of care, that participate in Medicare, Medicaid or managed care program needed by the patient, have an available bed and are willing to accept the patient.  Yes   Patient/family informed of [facility]'s ownership interest in [facility] and [facility], as well as of the fact that they are under no obligation to receive care at these facilities.  PASRR submitted to EDS on       PASRR number received on 04/12/17     Existing PASRR number confirmed on       FL2 transmitted to all facilities in geographic area requested by pt/family on 04/12/17     FL2 transmitted to all facilities within larger geographic area on       Patient informed that his/her managed care company has contracts with or will negotiate with certain facilities, including the following:        Yes   Patient/family informed of bed offers received.  Patient chooses bed at [facility]     Physician recommends and patient chooses bed at      Patient to be transferred to [facility] on 04/13/17.  Patient to be transferred to facility by [facility]     Patient family notified on 04/13/17 of transfer.  Name of family member notified:  son notified and family at bedside     PHYSICIAN Please prepare prescriptions, Please prepare  priority discharge summary, including medications     Additional Comment:    _______________________________________________ Normajean Baxter, LCSW 04/13/2017, 3:03 PM

## 2017-04-13 NOTE — Social Work (Signed)
Family has accepted bed offer at [facility].  CSW will f/u for dc to SNF.  Elissa Hefty, LCSW Clinical Social Worker [phone]

## 2017-04-13 NOTE — Progress Notes (Signed)
Patient discharged to [facility] reported to nurse Hyacinth Meeker.

## 2017-04-13 NOTE — Social Work (Signed)
Clinical Social Worker facilitated patient discharge including contacting patient family and facility to confirm patient discharge plans.  Clinical information faxed to facility and family agreeable with plan.    CSW arranged ambulance transport via [facility] to [facility].    RN to call [phone] to give report prior to discharge.  Clinical Social Worker will sign off for now as social work intervention is no longer needed. Please consult [place] again if new need arises.  Elissa Hefty, LCSW Clinical Social Worker [phone]

## 2017-04-13 NOTE — Discharge Summary (Addendum)
Physician Discharge Summary  Logan [name] PIR:[id number] DOB: Dec 29, 1934  PCP: No primary care provider on file.  Admit date: 04/10/2017 Discharge date: 04/13/2017  Recommendations for Outpatient Follow-up:  1. M.D. at SNF in 2 days with repeat labs (CBC & BMP). 2. Dr. Silvano Rusk, [facility] in 2 weeks. 3. Patient's PCP is in [place] and patient may follow-up with him upon return to [place].  Home Health: None Equipment/Devices: None    Discharge Condition: Improved and stable  CODE STATUS: Full  Diet recommendation: Heart healthy diet.  Discharge Diagnoses:  Principal Problem:   Upper GI bleed Active Problems:   Chronic diastolic CHF (congestive heart failure) (HCC)   Hyperglycemia   Malnutrition of mild degree (HCC)   Intractable vomiting with nausea   Physical deconditioning   Iron deficiency anemia   Gastroesophageal reflux disease with esophagitis   Malnutrition of moderate degree   Brief Summary: 82 year old Hindi speaking male with PMH of GERD, recalcitrant vomiting in the past, CVA, stage III chronic kidney disease, anemia, recurrent/persistent hiccups, 03/04/17 EGD for maroon emesis and hemoglobin dropped to 8 showed using, linear, esophageal ulcers, pathology confirmed ulcerative esophagitis without candida or dysplasia and EGD also showed nonbleeding antral erosions and 7 cm hiatal hernia. In late 2017/early 2018 MDs in [place] recommended fundoplication for persistent hiccups. He was seen by Dr. Carlean Purl 03/09/17 and continued on 4 times a day Carafate, Protonix 40 mg twice a day, ranitidine 300 mg at bedtime, baclofen, when necessary chlorpromazine for hiccups. Patient's son suspects that patient is not compliant with his medical regimen. He was admitted with black emesis. [facility] was consulted.  Assessment and plan:  1. Acute on chronic nausea and vomiting/severe reflux esophagitis: Presented with dark emesis. [facility] was consulted. He has known  severe reflux esophagitis seen as recently as 5 weeks ago on upper endoscopy. Family suspects that patient is not compliant with his medical regimen. GI did not see a need to repeat EGD. They felt that his severe esophagitis may also be contributing to his hiccups. MRI brain which was planned as outpatient was performed while hospitalized and did not show any acute findings. GI recommended twice a day PPI, H2 blocker at bedtime, sucralfate and when necessary Zofran. Patient has tolerated regular consistency diet with mild dry heaving but no vomiting for the last 24 hours. As per family's request, changed his GI medications to liquid form for easy tolerance. Recommend compliance with all aspects of medical care which will be easier now that he will be at SNF temporarily. 2. History of refractory hiccups: Was not a major issue while hospitalized. Remains on baclofen and when necessary Thorazine for same. MRI brain without acute findings. 3. Acute on chronic microcytic anemia/iron deficiency anemia: FOBT +. Hemoglobin stable in the mid-8 g range for the last 2 days. Close outpatient follow-up with repeat CBCs at SNF. Anemia panel as below indicates severe iron deficiency with ferritin of 7. Patient completed 2 days of IV iron while hospitalized. Oral iron use limited secondary to GI symptoms. May consider this later. 4. Prior stroke: Not on antiplatelets PTA. MRI brain did not show any acute findings. It did show some chronic findings as indicated below and these can be followed as outpatient. Patient will be discharged to SNF for rehabilitation due to deconditioning. 5. Acute on stage III chronic kidney disease: Baseline creatinine probably in the 1.2 range. Creatinine stable in the 1.39 range. Follow CBCs in a couple days as outpatient. 6.  Moderate malnutrition in the context of chronic illnesses: Nutritional supplementation as per dietitian input.    Consultations:  [facility]  Procedures:  None   Discharge Instructions  Discharge Instructions    Call MD for:    Complete by:  As directed    Vomiting coffee-ground material or blood. Blood in stools or black tarry stools.   Call MD for:  difficulty breathing, headache or visual disturbances    Complete by:  As directed    Call MD for:  extreme fatigue    Complete by:  As directed    Call MD for:  persistant dizziness or light-headedness    Complete by:  As directed    Call MD for:  persistant nausea and vomiting    Complete by:  As directed    Call MD for:  severe uncontrolled pain    Complete by:  As directed    Diet - low sodium heart healthy    Complete by:  As directed    Increase activity slowly    Complete by:  As directed        Medication List    STOP taking these medications   pantoprazole 40 MG tablet Commonly known as:  PROTONIX Replaced by:  pantoprazole sodium 40 mg/20 mL Pack   ranitidine 300 MG tablet Commonly known as:  ZANTAC   sucralfate 1 g tablet Commonly known as:  CARAFATE Replaced by:  sucralfate 1 GM/10ML suspension     TAKE these medications   Baclofen 5 MG Tabs Take 5 mg by mouth 3 (three) times daily.   chlorproMAZINE 25 MG tablet Commonly known as:  THORAZINE Take 1 tablet (25 mg total) by mouth 3 (three) times daily as needed.   famotidine 40 MG/5ML suspension Commonly known as:  PEPCID Take 1.3 mLs (10.4 mg total) by mouth at bedtime.   feeding supplement (ENSURE ENLIVE) Liqd Take 237 mLs by mouth 2 (two) times daily between meals.   ondansetron 4 MG disintegrating tablet Commonly known as:  ZOFRAN ODT Take 1 tablet (4 mg total) by mouth every 6 (six) hours as needed for nausea or vomiting. What changed:  how much to take  how to take this  when to take this  reasons to take this  additional instructions   pantoprazole sodium 40 mg/20 mL Pack Commonly known as:  PROTONIX Take 20 mLs (40 mg total) by mouth 2 (two) times  daily. Replaces:  pantoprazole 40 MG tablet   sucralfate 1 GM/10ML suspension Commonly known as:  CARAFATE Take 10 mLs (1 g total) by mouth 4 (four) times daily. Replaces:  sucralfate 1 g tablet       Contact information for follow-up providers    M.D. at SNF. Schedule an appointment as soon as possible for a visit in 2 day(s).   Why:  To be seen with repeat labs (CBC & BMP).       [name], MD. Schedule an appointment as soon as possible for a visit in 2 week(s).   Specialty:  Gastroenterology Contact information: [address] [place] [address] [phone]            Contact information for after-discharge care    [facility] Follow up.   Specialty:  [place] Contact information: [address] [place] [phone]                 No Known Allergies  Procedures/Studies: Dg Chest 2 View  Result Date: 04/10/2017 CLINICAL DATA:  Abdominal pain EXAM: CHEST  2 VIEW COMPARISON:  03/03/2017 FINDINGS: Normal cardiac silhouette with ectatic aorta. Fine interstitial lung disease not changed. Remote LEFT rib fractures. No pulmonary edema. No pneumothorax. No focal infiltrate. IMPRESSION: No acute cardiopulmonary process. Electronically Signed   By: Suzy Bouchard M.D.   On: 04/10/2017 21:12   Mr Jeri Cos FX Contrast  Result Date: 04/13/2017 CLINICAL DATA:  82 y/o  M; nausea and vomiting. EXAM: MRI HEAD WITHOUT AND WITH CONTRAST TECHNIQUE: Multiplanar, multiecho pulse sequences of the brain and surrounding structures were obtained without and with intravenous contrast. CONTRAST:  6mL MULTIHANCE GADOBENATE DIMEGLUMINE 529 MG/ML IV SOLN COMPARISON:  None. FINDINGS: Brain: No acute infarction, hemorrhage, hydrocephalus, extra-axial collection or mass lesion of the brain. Small T2 hyperintense foci are present within the caudate bodies and retaining compatible chronic lacunar  infarctions. Diffuse patchy nonspecific foci of T2 FLAIR hyperintense signal abnormality in subcortical and periventricular white matter are compatible with advanced chronic microvascular ischemic changes for age. Moderate brain parenchymal volume loss. T2 hyperintense and T1 hypointense nonenhancing well-circumscribed round 10 mm structure in the right pituitary fossa compatible with Rathke's cleft cyst. Vascular: Normal flow voids. Skull and upper cervical spine: Normal marrow signal. Sinuses/Orbits: Negative. Other: 9 mm left parietal scalp dermal lesion, likely sebaceous cyst. 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 IMPRESSION: 1. No acute intracranial abnormality or abnormal enhancement of the brain. 2. 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 No further imaging evaluation is necessary. This follows ACR consensus guidelines: Management of Incidental Pituitary Findings on CT, MRI and F18-FDG PET: A White Paper of the ACR Incidental Findings Committee. J Am Coll Radiol 2018; 15: 966-72. 3. Advanced chronic microvascular ischemic changes and moderate parenchymal volume loss of the brain. 4. Right frontal scalp lipoma. 5. Left parietal scalp dermal lesion measuring 9 mm, likely sebaceous cyst, direct visualization recommended. Electronically Signed   By: Kristine Garbe M.D.   On: 04/13/2017 00:55      Subjective: Seen this morning. Reported mild nausea and dry heaves this morning but no vomiting since yesterday morning. Tolerated  regular consistency diet. Denies abdominal pain or pain elsewhere. No dizziness or lightheadedness reported. No hiccups reported. I interviewed patient in Hindi.  Discharge Exam:  Vitals:   04/12/17 1417 04/12/17 2145 04/13/17 0550 04/13/17 1230  BP: 140/79 132/76 140/78 (!) 132/92  Pulse: 96 94 93 (!) 52  Resp: 17 17 18 18   Temp: 98.4 F (36.9 C) 98.3 F (36.8 C) 98.2 F (36.8 C) 98 F (36.7 C)  TempSrc: Oral Oral  Oral  SpO2: 100%  98% 99% 99%  Weight:      Height:        General: Pt lying comfortably in bed & appears in no obvious distress.Moderately built, frail and chronically ill-looking lying propped up in bed. Cardiovascular: S1 & S2 heard, RRR, S1/S2 +. No murmurs, rubs, gallops or clicks. No JVD or pedal edema. Respiratory: Clear to auscultation without wheezing, rhonchi or crackles. No increased work of breathing. Abdominal:  Non distended, non tender & soft. No organomegaly or masses appreciated. Normal bowel sounds heard. CNS: Alert and oriented. No focal deficits. Extremities: no edema, no cyanosis Skin: Noted approximately 1 cm diameter dome-shaped soft tissue swelling over right frontal scalp and approximately 0.5 cm dome-shaped soft tissue swelling over left parietal scalp without acute findings. These appear chronic in nature.    The  results of significant diagnostics from this hospitalization (including imaging, microbiology, ancillary and laboratory) are listed below for reference.       Labs: CBC:  Recent Labs Lab 04/10/17 2120 04/11/17 0650 04/12/17 0725 04/12/17 1131 04/13/17 0400  WBC 12.3* 9.1 5.7 7.4 8.5  NEUTROABS 9.4*  --   --   --   --   HGB 9.4* 8.6* 7.3* 8.9* 8.4*  HCT 31.2* 28.9* 24.6* 30.9* 28.3*  MCV 72.1* 72.6* 73.4* 74.1* 72.8*  PLT 402* 368 330 392 924   Basic Metabolic Panel:  Recent Labs Lab 04/10/17 2120 04/11/17 0650  NA 135 137  K 3.7 3.5  CL 96* 100*  CO2 29 29  GLUCOSE 147* 113*  BUN 36* 34*  CREATININE 1.38* 1.39*  CALCIUM 8.4* 8.1*   Liver Function Tests:  Recent Labs Lab 04/10/17 2120  AST 15  ALT 11*  ALKPHOS 62  BILITOT 0.7  PROT 6.4*  ALBUMIN 3.1*   Anemia work up  Recent Labs  04/11/17 1528  FERRITIN 7*  TIBC 301  IRON 10*   Discussed in detail with patient's son. Updated care and answered questions.    Time coordinating discharge: Over 30 minutes  SIGNED:  Vernell Leep, MD, FACP, [place]. [facility] Pager  [phone]  If 7PM-7AM, please contact night-coverage [email or web address] Password [email or web address] 04/13/2017, 4:02 PM

## 2017-04-13 NOTE — Social Work (Signed)
CSw spoke with son and daughter-n-law about bed offers. Family will review list and let CSW know. CSW discussed dc today.  CSW will f/u.  Elissa Hefty, LCSW Clinical Social Worker [phone]

## 2017-04-13 NOTE — Discharge Instructions (Signed)

## 2017-05-04 ENCOUNTER — Telehealth: Payer: Self-pay | Admitting: Internal Medicine

## 2017-05-04 NOTE — Telephone Encounter (Signed)
Spoke with [facility] RN and let her know that Dr. Carlean Purl did not order the referral and that she needed to contact the physician that made the referral to sign the orders.

## 2017-05-06 ENCOUNTER — Other Ambulatory Visit: Payer: Self-pay

## 2017-05-06 ENCOUNTER — Inpatient Hospital Stay (HOSPITAL_COMMUNITY)
Admission: EM | Admit: 2017-05-06 | Discharge: 2017-05-10 | DRG: 378 | Disposition: A | Discharge status: Home Health | Payer: Medicare Other | Attending: Internal Medicine | Admitting: Internal Medicine

## 2017-05-06 ENCOUNTER — Emergency Department (HOSPITAL_COMMUNITY): Payer: Medicare Other

## 2017-05-06 ENCOUNTER — Telehealth: Payer: Self-pay | Admitting: Internal Medicine

## 2017-05-06 ENCOUNTER — Encounter (HOSPITAL_COMMUNITY): Payer: Self-pay | Admitting: Radiology

## 2017-05-06 DIAGNOSIS — K209 Esophagitis, unspecified without bleeding: Secondary | ICD-10-CM

## 2017-05-06 DIAGNOSIS — R066 Hiccough: Secondary | ICD-10-CM | POA: Diagnosis not present

## 2017-05-06 DIAGNOSIS — K922 Gastrointestinal hemorrhage, unspecified: Secondary | ICD-10-CM | POA: Diagnosis present

## 2017-05-06 DIAGNOSIS — N183 Chronic kidney disease, stage 3 (moderate): Secondary | ICD-10-CM | POA: Diagnosis present

## 2017-05-06 DIAGNOSIS — R1013 Epigastric pain: Secondary | ICD-10-CM

## 2017-05-06 DIAGNOSIS — D500 Iron deficiency anemia secondary to blood loss (chronic): Secondary | ICD-10-CM | POA: Diagnosis not present

## 2017-05-06 DIAGNOSIS — Z8711 Personal history of peptic ulcer disease: Secondary | ICD-10-CM

## 2017-05-06 DIAGNOSIS — K449 Diaphragmatic hernia without obstruction or gangrene: Secondary | ICD-10-CM | POA: Diagnosis present

## 2017-05-06 DIAGNOSIS — K920 Hematemesis: Secondary | ICD-10-CM | POA: Diagnosis not present

## 2017-05-06 DIAGNOSIS — Z8673 Personal history of transient ischemic attack (TIA), and cerebral infarction without residual deficits: Secondary | ICD-10-CM

## 2017-05-06 DIAGNOSIS — R000 Tachycardia, unspecified: Secondary | ICD-10-CM | POA: Diagnosis present

## 2017-05-06 DIAGNOSIS — K2100 Gastro-esophageal reflux disease with esophagitis, without bleeding: Secondary | ICD-10-CM | POA: Diagnosis present

## 2017-05-06 DIAGNOSIS — E875 Hyperkalemia: Secondary | ICD-10-CM | POA: Diagnosis not present

## 2017-05-06 DIAGNOSIS — R933 Abnormal findings on diagnostic imaging of other parts of digestive tract: Secondary | ICD-10-CM

## 2017-05-06 DIAGNOSIS — I2510 Atherosclerotic heart disease of native coronary artery without angina pectoris: Secondary | ICD-10-CM | POA: Diagnosis present

## 2017-05-06 DIAGNOSIS — Z79899 Other long term (current) drug therapy: Secondary | ICD-10-CM

## 2017-05-06 DIAGNOSIS — D509 Iron deficiency anemia, unspecified: Secondary | ICD-10-CM | POA: Diagnosis present

## 2017-05-06 DIAGNOSIS — E785 Hyperlipidemia, unspecified: Secondary | ICD-10-CM | POA: Diagnosis present

## 2017-05-06 DIAGNOSIS — I5032 Chronic diastolic (congestive) heart failure: Secondary | ICD-10-CM | POA: Diagnosis present

## 2017-05-06 DIAGNOSIS — K2210 Ulcer of esophagus without bleeding: Secondary | ICD-10-CM | POA: Diagnosis present

## 2017-05-06 DIAGNOSIS — D72829 Elevated white blood cell count, unspecified: Secondary | ICD-10-CM | POA: Diagnosis present

## 2017-05-06 DIAGNOSIS — Z8719 Personal history of other diseases of the digestive system: Secondary | ICD-10-CM

## 2017-05-06 DIAGNOSIS — K5730 Diverticulosis of large intestine without perforation or abscess without bleeding: Secondary | ICD-10-CM | POA: Diagnosis present

## 2017-05-06 LAB — HEMOGLOBIN AND HEMATOCRIT, BLOOD
HCT: 33.1 % — ABNORMAL LOW (ref 39.0–52.0)
Hemoglobin: 9.9 g/dL — ABNORMAL LOW (ref 13.0–17.0)

## 2017-05-06 LAB — COMPREHENSIVE METABOLIC PANEL
ALT: 12 U/L — ABNORMAL LOW (ref 17–63)
AST: 19 U/L (ref 15–41)
Albumin: 3.4 g/dL — ABNORMAL LOW (ref 3.5–5.0)
Alkaline Phosphatase: 114 U/L (ref 38–126)
Anion gap: 8 (ref 5–15)
BILIRUBIN TOTAL: 0.4 mg/dL (ref 0.3–1.2)
BUN: 18 mg/dL (ref 6–20)
CO2: 27 mmol/L (ref 22–32)
CREATININE: 1.41 mg/dL — AB (ref 0.61–1.24)
Calcium: 8.9 mg/dL (ref 8.9–10.3)
Chloride: 99 mmol/L — ABNORMAL LOW (ref 101–111)
GFR, EST AFRICAN AMERICAN: 53 mL/min — AB (ref >60)
GFR, EST NON AFRICAN AMERICAN: 46 mL/min — AB (ref >60)
Glucose, Bld: 146 mg/dL — ABNORMAL HIGH (ref 65–99)
POTASSIUM: 5.2 mmol/L — AB (ref 3.5–5.1)
Sodium: 134 mmol/L — ABNORMAL LOW (ref 135–145)
TOTAL PROTEIN: 6.7 g/dL (ref 6.5–8.1)

## 2017-05-06 LAB — CBC WITH DIFFERENTIAL/PLATELET
BASOS PCT: 0 %
Basophils Absolute: 0 10*3/uL (ref 0.0–0.1)
EOS PCT: 0 %
Eosinophils Absolute: 0 10*3/uL (ref 0.0–0.7)
HEMATOCRIT: 35.4 % — AB (ref 39.0–52.0)
Hemoglobin: 10.5 g/dL — ABNORMAL LOW (ref 13.0–17.0)
LYMPHS ABS: 1.3 10*3/uL (ref 0.7–4.0)
Lymphocytes Relative: 9 %
MCH: 21.8 pg — AB (ref 26.0–34.0)
MCHC: 29.7 g/dL — ABNORMAL LOW (ref 30.0–36.0)
MCV: 73.6 fL — AB (ref 78.0–100.0)
MONOS PCT: 5 %
Monocytes Absolute: 0.7 10*3/uL (ref 0.1–1.0)
NEUTROS ABS: 12.7 10*3/uL — AB (ref 1.7–7.7)
Neutrophils Relative %: 86 %
Platelets: 360 10*3/uL (ref 150–400)
RBC: 4.81 MIL/uL (ref 4.22–5.81)
RDW: 17.4 % — AB (ref 11.5–15.5)
WBC: 14.7 10*3/uL — AB (ref 4.0–10.5)

## 2017-05-06 LAB — TYPE AND SCREEN
ABO/RH(D): B POS
Antibody Screen: NEGATIVE

## 2017-05-06 LAB — POC OCCULT BLOOD, ED: Fecal Occult Bld: NEGATIVE

## 2017-05-06 LAB — I-STAT TROPONIN, ED: TROPONIN I, POC: 0 ng/mL (ref 0.00–0.08)

## 2017-05-06 LAB — LIPASE, BLOOD: LIPASE: 24 U/L (ref 11–51)

## 2017-05-06 MED ORDER — METOCLOPRAMIDE HCL 5 MG/ML IJ SOLN
10.0000 mg | Freq: Three times a day (TID) | INTRAMUSCULAR | Status: DC
  Administered 2017-05-07 (×3): 10 mg via INTRAVENOUS
  Filled 2017-05-06 (×3): qty 2

## 2017-05-06 MED ORDER — SODIUM CHLORIDE 0.9 % IV SOLN
25.0000 mg | Freq: Three times a day (TID) | INTRAVENOUS | Status: DC | PRN
  Administered 2017-05-07: 25 mg via INTRAVENOUS
  Filled 2017-05-06 (×2): qty 1

## 2017-05-06 MED ORDER — ONDANSETRON HCL 4 MG PO TABS: 4.0000 mg | ORAL_TABLET | Freq: Four times a day (QID) | ORAL | Status: DC | PRN

## 2017-05-06 MED ORDER — ACETAMINOPHEN 325 MG PO TABS: 650.0000 mg | ORAL_TABLET | Freq: Four times a day (QID) | ORAL | Status: DC | PRN

## 2017-05-06 MED ORDER — PANTOPRAZOLE SODIUM 40 MG IV SOLR
40.0000 mg | Freq: Once | INTRAVENOUS | Status: AC
  Administered 2017-05-06: 40 mg via INTRAVENOUS
  Filled 2017-05-06: qty 40

## 2017-05-06 MED ORDER — SODIUM CHLORIDE 0.9 % IV BOLUS (SEPSIS)
1000.0000 mL | Freq: Once | INTRAVENOUS | Status: AC
  Administered 2017-05-06: 1000 mL via INTRAVENOUS

## 2017-05-06 MED ORDER — ALBUTEROL SULFATE (2.5 MG/3ML) 0.083% IN NEBU: 2.5000 mg | INHALATION_SOLUTION | RESPIRATORY_TRACT | Status: DC | PRN

## 2017-05-06 MED ORDER — MORPHINE SULFATE (PF) 4 MG/ML IV SOLN: 2.0000 mg | INTRAVENOUS | Status: DC | PRN

## 2017-05-06 MED ORDER — IOPAMIDOL (ISOVUE-300) INJECTION 61%
INTRAVENOUS | Status: AC
  Administered 2017-05-06: 100 mL
  Filled 2017-05-06: qty 100

## 2017-05-06 MED ORDER — PROMETHAZINE HCL 25 MG/ML IJ SOLN
12.5000 mg | Freq: Once | INTRAMUSCULAR | Status: AC
  Administered 2017-05-06: 12.5 mg via INTRAVENOUS
  Filled 2017-05-06: qty 1

## 2017-05-06 MED ORDER — PANTOPRAZOLE SODIUM 40 MG IV SOLR
40.0000 mg | Freq: Two times a day (BID) | INTRAVENOUS | Status: DC
  Administered 2017-05-07 – 2017-05-09 (×5): 40 mg via INTRAVENOUS
  Filled 2017-05-06 (×5): qty 40

## 2017-05-06 MED ORDER — ONDANSETRON HCL 4 MG/2ML IJ SOLN: 4.0000 mg | Freq: Four times a day (QID) | INTRAMUSCULAR | Status: DC | PRN

## 2017-05-06 MED ORDER — ACETAMINOPHEN 650 MG RE SUPP: 650.0000 mg | Freq: Four times a day (QID) | RECTAL | Status: DC | PRN

## 2017-05-06 MED ORDER — MORPHINE SULFATE (PF) 4 MG/ML IV SOLN
4.0000 mg | Freq: Once | INTRAVENOUS | Status: AC
  Administered 2017-05-06: 4 mg via INTRAVENOUS
  Filled 2017-05-06: qty 1

## 2017-05-06 NOTE — Telephone Encounter (Signed)
Cecille Rubin returning phone call states her best call back # [phone].

## 2017-05-06 NOTE — ED Notes (Signed)
Nurse drawing labs. 

## 2017-05-06 NOTE — Telephone Encounter (Signed)
FYI Dr. Gessner 

## 2017-05-06 NOTE — ED Notes (Signed)
Manish (son) cell: [phone] Please call with updates

## 2017-05-06 NOTE — ED Notes (Signed)
Son and daughter phoned and updated on pt's admission status

## 2017-05-06 NOTE — Telephone Encounter (Signed)
OK 

## 2017-05-06 NOTE — ED Notes (Signed)
Pt is difficult stick, MD to attempt ultrasound guided insertion at this time

## 2017-05-06 NOTE — ED Notes (Signed)
Pt's son phoned and updated

## 2017-05-06 NOTE — ED Provider Notes (Addendum)
[facility] EMERGENCY DEPARTMENT Provider Note   CSN: [id number] Arrival date & time: 05/06/17  1611     History   Chief Complaint Chief Complaint  Patient presents with  . Hematemesis    HPI Logan [name] is a 82 y.o. male history of gastric ulcer, upper GI bleed, presenting with coffee-ground emesis, abdominal pain, vomiting.  Patient states that he had 4 episodes of coffee-ground emesis as well as periumbilical pain started today.  Denies any radiation to the pain and is still passing gas.  Patient took some ODT Zofran with no relief.  Patient had previous upper GI bleed and is currently on Protonix and not on blood thinners.  Patient denies any NSAID use or alcohol use.  EMS noticed that patient is tachycardic about 120 but vitals otherwise stable.   The history is provided by the patient.    Past Medical History:  Diagnosis Date  . Anemia   . Cataract   . Esophageal ulcer   . Gastritis    proximal  . Hiatal hernia   . Hyperlipidemia   . Intractable hiccups   . Reflux esophagitis   . Stroke [facility])    r side deficits; September 2017  . Upper GI bleed     Patient Active Problem List   Diagnosis Date Noted  . Malnutrition of moderate degree 04/13/2017  . Gastroesophageal reflux disease with esophagitis   . Hyperglycemia 04/11/2017  . Malnutrition of mild degree ([place]) 04/11/2017  . Intractable vomiting with nausea   . Physical deconditioning   . Iron deficiency anemia   . Upper GI bleed   . Esophageal ulcer with bleeding   . Cardiomegaly   . Chronic diastolic CHF (congestive heart failure) ([place])   . Intractable hiccups   . Cerebrovascular accident (CVA) ([place])   . SVT (supraventricular tachycardia) ([place])   . Hematemesis 03/03/2017    Past Surgical History:  Procedure Laterality Date  . COLONOSCOPY    . ESOPHAGOGASTRODUODENOSCOPY    . UPPER GASTROINTESTINAL ENDOSCOPY         Home Medications    Prior to Admission medications     Medication Sig Start Date End Date Taking? Authorizing Provider  Baclofen 5 MG TABS Take 5 mg by mouth 3 (three) times daily. 03/17/17  Yes [name], MD  chlorproMAZINE (THORAZINE) 25 MG tablet Take 1 tablet (25 mg total) by mouth 3 (three) times daily as needed. 03/09/17  Yes [name], MD  famotidine (PEPCID) 40 MG/5ML suspension Take 1.3 mLs (10.4 mg total) by mouth at bedtime. 04/13/17  Yes Hongalgi, Lenis Dickinson, MD  feeding supplement, ENSURE ENLIVE, (ENSURE ENLIVE) LIQD Take 237 mLs by mouth 2 (two) times daily between meals. 04/13/17  Yes Hongalgi, Lenis Dickinson, MD  ondansetron (ZOFRAN ODT) 4 MG disintegrating tablet Take 1 tablet (4 mg total) by mouth every 6 (six) hours as needed for nausea or vomiting. 04/13/17  Yes Hongalgi, Lenis Dickinson, MD  pantoprazole sodium (PROTONIX) 40 mg/20 mL PACK Take 20 mLs (40 mg total) by mouth 2 (two) times daily. 04/13/17  Yes Hongalgi, Lenis Dickinson, MD  sucralfate (CARAFATE) 1 GM/10ML suspension Take 10 mLs (1 g total) by mouth 4 (four) times daily. 04/13/17 04/13/18 Yes Hongalgi, Lenis Dickinson, MD    Family History Family History  Problem Relation Age of Onset  . Colon cancer Neg Hx   . Esophageal cancer Neg Hx   . Rectal cancer Neg Hx     Social History Social History  Tobacco Use  . Smoking status: Never Smoker  . Smokeless tobacco: Never Used  Substance Use Topics  . Alcohol use: No  . Drug use: No     Allergies   Patient has no known allergies.   Review of Systems Review of Systems  Gastrointestinal: Positive for abdominal pain and vomiting.  All other systems reviewed and are negative.    Physical Exam Updated Vital Signs BP (!) 130/94 (BP Location: Left Arm)   Pulse (!) 120   Temp 99.3 F (37.4 C) (Oral)   Resp 18   Ht 5\' 7"  (1.702 m)   Wt 58.5 kg (129 lb)   SpO2 99%   BMI 20.20 kg/m   Physical Exam  Constitutional: He is oriented to person, place, and time.  Uncomfortable, dehydrated   HENT:  Head: Normocephalic.  MM  dry   Eyes: Conjunctivae and EOM are normal. Pupils are equal, round, and reactive to light.  Neck: Normal range of motion. Neck supple.  Cardiovascular: Regular rhythm and normal heart sounds.  Tachycardic   Pulmonary/Chest: Effort normal and breath sounds normal. No stridor. No respiratory distress.  Abdominal:  Mild periumbilical tenderness, no rebound   Musculoskeletal: Normal range of motion.  Neurological: He is alert and oriented to person, place, and time.  Skin: Skin is warm.  Psychiatric: He has a normal mood and affect.  Nursing note and vitals reviewed.    ED Treatments / Results  Labs (all labs ordered are listed, but only abnormal results are displayed) Labs Reviewed  CBC WITH DIFFERENTIAL/PLATELET - Abnormal; Notable for the following components:      Result Value   WBC 14.7 (*)    Hemoglobin 10.5 (*)    HCT 35.4 (*)    MCV 73.6 (*)    MCH 21.8 (*)    MCHC 29.7 (*)    RDW 17.4 (*)    Neutro Abs 12.7 (*)    All other components within normal limits  COMPREHENSIVE METABOLIC PANEL - Abnormal; Notable for the following components:   Sodium 134 (*)    Potassium 5.2 (*)    Chloride 99 (*)    Glucose, Bld 146 (*)    Creatinine, Ser 1.41 (*)    Albumin 3.4 (*)    ALT 12 (*)    GFR calc non Af Amer 46 (*)    GFR calc Af Amer 53 (*)    All other components within normal limits  LIPASE, BLOOD  I-STAT TROPONIN, ED  POC OCCULT BLOOD, ED  TYPE AND SCREEN    EKG  EKG Interpretation  Date/Time:  Friday May 06 2017 17:43:33 EST Ventricular Rate:  112 PR Interval:    QRS Duration: 76 QT Interval:  321 QTC Calculation: 439 R Axis:   31 Text Interpretation:  Sinus tachycardia No significant change since last tracing Confirmed by Wandra Arthurs [phone]) on 05/06/2017 6:53:55 PM       Radiology Ct Chest Wo Contrast  Result Date: 05/06/2017 CLINICAL DATA:  Evaluate for possible esophageal injury. EXAM: CT CHEST WITHOUT CONTRAST TECHNIQUE: Multidetector CT  imaging of the chest was performed following the standard protocol without IV contrast. COMPARISON:  CT abdomen, same date. FINDINGS: Cardiovascular: The heart is within normal limits in size for age. No pericardial effusion. There is tortuosity, ectasia and calcification of the thoracic aorta. Three-vessel coronary artery calcifications are noted. Mediastinum/Nodes: No mediastinal lymphadenopathy. The distal esophagus demonstrates extensive wall thickening but no esophageal leak/tear/rupture is identified. No obvious deep esophageal  ulcer is demonstrated. Lungs/Pleura: Patchy areas of atelectasis and basilar scarring changes but no infiltrates, pneumothorax or pneumomediastinum. Upper Abdomen: No significant upper abdominal findings. The stomach is grossly. Musculoskeletal: No significant bony findings. IMPRESSION: 1. No CT findings for esophageal leak/tear/rupture. 2. Markedly thickened distal esophageal wall suggesting severe esophagitis without obvious ulcer. 3. No significant pulmonary findings. Patchy areas of atelectasis and mild basilar scarring. Aortic Atherosclerosis (ICD10-I70.0). Electronically Signed   By: Marijo Sanes M.D.   On: 05/06/2017 20:38   Ct Abdomen Pelvis W Contrast  Result Date: 05/06/2017 CLINICAL DATA:  Abdominal pain hematemesis since 7 o'clock this morning. EXAM: CT ABDOMEN AND PELVIS WITH CONTRAST TECHNIQUE: Multidetector CT imaging of the abdomen and pelvis was performed using the standard protocol following bolus administration of intravenous contrast. CONTRAST:  < 100 cc ISOVUE-300 IOPAMIDOL (ISOVUE-300) INJECTION 61% COMPARISON:  CT scan 11/26/2016 FINDINGS: Lower chest: The lung bases are grossly clear. No pleural effusion. Minimal subpleural atelectasis. The heart is normal in size.  No pericardial effusion. Significant wall thickening and edema involving the distal esophagus. Patient has a history of prior bleeding esophageal ulcers on endoscopy from September 2018. There is  also a small hiatal hernia and there appears to be a small amount of fluid around the esophagus. Could not exclude a vomiting associated distal esophageal injury. Hepatobiliary: No focal hepatic lesions or intrahepatic biliary dilatation. A few tiny low-attenuation lesions are likely cysts. Gallbladder appears normal. No common bile duct dilatation. Pancreas: No mass, inflammation or duct dilatation. Spleen: Normal size.  No focal lesions. Adrenals/Urinary Tract: The adrenal glands are normal. Small renal cysts. No worrisome renal lesions, renal calculi or hydronephrosis. No obstructing ureteral calculi or bladder calculi. Stomach/Bowel: The stomach is grossly normal. No obvious ulcer. The duodenum, small bowel and colon are grossly normal without oral contrast. Scattered colonic diverticulosis. The terminal ileum is normal. The appendix is normal. Vascular/Lymphatic: Scattered atherosclerotic calcifications involving the aorta but no aneurysm or dissection. The major venous structures are patent. No mesenteric or retroperitoneal mass or adenopathy. Reproductive: The prostate gland and seminal vesicles are unremarkable. Other: No pelvic mass or adenopathy. No free pelvic fluid collections. No inguinal mass or adenopathy. No abdominal wall hernia or subcutaneous lesions. Musculoskeletal: No significant bony findings. A right hip prosthesis is noted with significant artifact through the lower pelvis. Remote compression deformity of L1 is noted. IMPRESSION: Markedly thickened distal esophagus with some surrounding fluid. Could not exclude a vomiting related esophageal injury. A chest CT with ingestion of small amount of water-soluble oral contrast may be helpful to exclude an esophageal leak. Patient also had esophageal ulcers on endoscopy from September. No acute abdominal/ pelvic findings, mass lesions or adenopathy. Electronically Signed   By: Marijo Sanes M.D.   On: 05/06/2017 18:52    Procedures Procedures  (including critical care time)  Angiocath insertion Performed by: Wandra Arthurs  Consent: Verbal consent obtained. Risks and benefits: risks, benefits and alternatives were discussed Time out: Immediately prior to procedure a "time out" was called to verify the correct patient, procedure, equipment, support staff and site/side marked as required.  Preparation: Patient was prepped and draped in the usual sterile fashion.  Vein Location: R antecube  Ultrasound Guided  Gauge: 20 long   Normal blood return and flush without difficulty Patient tolerance: Patient tolerated the procedure well with no immediate complications.     Medications Ordered in ED Medications  sodium chloride 0.9 % bolus 1,000 mL (0 mLs Intravenous Stopped 05/06/17 1843)  morphine 4 MG/ML injection 4 mg (4 mg Intravenous Given 05/06/17 1709)  pantoprazole (PROTONIX) injection 40 mg (40 mg Intravenous Given 05/06/17 1709)  promethazine (PHENERGAN) injection 12.5 mg (12.5 mg Intravenous Given 05/06/17 1709)  iopamidol (ISOVUE-300) 61 % injection (100 mLs  Contrast Given 05/06/17 1814)  sodium chloride 0.9 % bolus 1,000 mL (0 mLs Intravenous Stopped 05/06/17 2226)     Initial Impression / Assessment and Plan / ED Course  I have reviewed the triage vital signs and the nursing notes.  Pertinent labs & imaging results that were available during my care of the patient were reviewed by me and considered in my medical decision making (see chart for details).     Logan [name] is a 82 y.o. male here with coffee ground emesis, abdominal pain. Likely upper GI bleed vs SBO. Will get labs, lipase, CT ab/pel. Will hydrate and give IV protonix.   7:30 pm CT ab/pel showed possible esophageal perforation. Recommend CT chest with oral contrast. Hg stable.   10:41 PM Still tachycardic. CT chest showed no perforation. Still tachycardic. Given IVF. Called Dr. Hilarie Fredrickson, who is covering Dr. Carlean Purl. He will see patient in the morning.  Hospitalist to admit.   Final Clinical Impressions(s) / ED Diagnoses   Final diagnoses:  None    ED Discharge Orders    None       Drenda Freeze, MD 05/06/17 2241    Drenda Freeze, MD 05/06/17 2241

## 2017-05-06 NOTE — ED Notes (Signed)
Pt complaining of hiccups, requesting medication to make them stop. Wife remains bedside.

## 2017-05-06 NOTE — Telephone Encounter (Signed)
Left message on machine to call back  

## 2017-05-06 NOTE — H&P (Addendum)
History and Physical    Logan [name] DVV:[id number] DOB: 1934/06/29 DOA: 05/06/2017  Referring MD/NP/PA: Dr. Shirlyn Goltz PCP: Elby Beck, FNP  Patient coming from: home  Chief Complaint: Vomiting up black coffee-ground  HPI: Logan [name] is a 82 y.o. male with medical history significant of iron deficiency anemia, chronic hiccups, diastolic CHF last EF 62-69% with grade 1dFx, GERD with esophagitis, CVA, and CKD stage III; who presents after having 4 episodes of coffee ground emesis today. Family helps translate. He also complains of epigastric pain which started after he started vomiting.  Denies being on blood thinners or utilizing any NSAIDs or alcohol.  Associated symptoms include chronic hiccups despite medications of Thorazine and baclofen.  He is followed by Dr. Arelia Longest of gastroenterology in the outpatient setting.  They note that this is his fourth admission in the last 2 months for the same. Last admitted to the hospital from 10/14-10/17, and his last EGD was on 03/04/17 showed linear, esophageal ulcers, antral erosions, and pathology confirmed ulcerative esophagitis.  Patient has been evaluated with MRIs of the brain which showed no acute abnormalities.    ED Course: Upon admission into the emergency department patient was seen to have a temperature of 99.3 F, pulse 115-123, respirations 15-18, and all other vital signs maintained.  Labs revealed WBC 14.7, hemoglobin 10.5, potassium 5.2, BUN 18, and creatinine 1.41.  Stool guaiac was negative.  CT scans of the chest and abdomen revealed thickened esophagus suggestive of severe esophagitis with no clear visible ulcer.  While in the emergency department he received 2 L of IV fluids, morphine, 40 mg of Protonix IV, and Phenergan.  Dr.Pyrtle of gastroenterology was consulted and will see the patient in a.m.  [facility] called to admit.  Review of Systems  Constitutional: Positive for malaise/fatigue.  HENT: Negative for congestion and  nosebleeds.   Eyes: Negative for photophobia and pain.  Cardiovascular: Negative for chest pain and leg swelling.  Gastrointestinal: Positive for abdominal pain, nausea and vomiting. Negative for melena.       Positive for hematemesis  Genitourinary: Negative for dysuria and hematuria.  Musculoskeletal: Negative for falls and joint pain.  Skin: Negative for itching and rash.  Neurological: Positive for weakness. Negative for speech change and focal weakness.  Endo/Heme/Allergies: Negative for environmental allergies and polydipsia.  Psychiatric/Behavioral: Negative for substance abuse and suicidal ideas.    Past Medical History:  Diagnosis Date  . Anemia   . Cataract   . Esophageal ulcer   . Gastritis    proximal  . Hiatal hernia   . Hyperlipidemia   . Intractable hiccups   . Reflux esophagitis   . Stroke [facility])    r side deficits; September 2017  . Upper GI bleed     Past Surgical History:  Procedure Laterality Date  . COLONOSCOPY    . ESOPHAGOGASTRODUODENOSCOPY    . UPPER GASTROINTESTINAL ENDOSCOPY       reports that  has never smoked. he has never used smokeless tobacco. He reports that he does not drink alcohol or use drugs.  No Known Allergies  Family History  Problem Relation Age of Onset  . Colon cancer Neg Hx   . Esophageal cancer Neg Hx   . Rectal cancer Neg Hx     Prior to Admission medications   Medication Sig Start Date End Date Taking? Authorizing Provider  Baclofen 5 MG TABS Take 5 mg by mouth 3 (three) times daily. 03/17/17  Yes [name], MD  chlorproMAZINE (THORAZINE) 25 MG tablet Take 1 tablet (25 mg total) by mouth 3 (three) times daily as needed. 03/09/17  Yes [name], MD  famotidine (PEPCID) 40 MG/5ML suspension Take 1.3 mLs (10.4 mg total) by mouth at bedtime. 04/13/17  Yes Hongalgi, Lenis Dickinson, MD  feeding supplement, ENSURE ENLIVE, (ENSURE ENLIVE) LIQD Take 237 mLs by mouth 2 (two) times daily between meals. 04/13/17  Yes Hongalgi,  Lenis Dickinson, MD  ondansetron (ZOFRAN ODT) 4 MG disintegrating tablet Take 1 tablet (4 mg total) by mouth every 6 (six) hours as needed for nausea or vomiting. 04/13/17  Yes Hongalgi, Lenis Dickinson, MD  pantoprazole sodium (PROTONIX) 40 mg/20 mL PACK Take 20 mLs (40 mg total) by mouth 2 (two) times daily. 04/13/17  Yes Hongalgi, Lenis Dickinson, MD  sucralfate (CARAFATE) 1 GM/10ML suspension Take 10 mLs (1 g total) by mouth 4 (four) times daily. 04/13/17 04/13/18 Yes Hongalgi, Lenis Dickinson, MD    Physical Exam:  Constitutional: Male who appears to be in moderate distress with continuous hiccups Vitals:   05/06/17 1713 05/06/17 1900 05/06/17 2000 05/06/17 2100  BP: 139/90 126/86 128/77 (!) 130/94  Pulse: (!) 115  (!) 117 (!) 120  Resp: 15 17 18 18   Temp:      TempSrc:      SpO2: 100% 98% 99% 99%  Weight:      Height:       Eyes: PERRL, lids and conjunctivae normal ENMT: Mucous membranes are dry. Posterior pharynx clear of any exudate or lesions.  Neck: normal, supple, no masses, no thyromegaly Respiratory: clear to auscultation bilaterally, no wheezing, no crackles. Normal respiratory effort. No accessory muscle use.  Cardiovascular: Tachycardic, no murmurs / rubs / gallops. No extremity edema. 2+ pedal pulses. No carotid bruits.  Abdomen: Moderate epigastric tenderness, no masses palpated. No hepatosplenomegaly. Bowel sounds positive.  Musculoskeletal: no clubbing / cyanosis. No joint deformity upper and lower extremities. Good ROM, no contractures. Normal muscle tone.  Skin: no rashes, lesions, ulcers. No induration Neurologic: CN 2-12 grossly intact. Sensation intact, DTR normal. Strength 5/5 in all 4.  Psychiatric: Normal judgment and insight. Alert and oriented x 3. Normal mood.     Labs on Admission: I have personally reviewed following labs and imaging studies  CBC: Recent Labs  Lab 05/06/17 1656  WBC 14.7*  NEUTROABS 12.7*  HGB 10.5*  HCT 35.4*  MCV 73.6*  PLT 829   Basic Metabolic  Panel: Recent Labs  Lab 05/06/17 1656  NA 134*  K 5.2*  CL 99*  CO2 27  GLUCOSE 146*  BUN 18  CREATININE 1.41*  CALCIUM 8.9   GFR: Estimated Creatinine Clearance: 35.2 mL/min (A) (by C-G formula based on SCr of 1.41 mg/dL (H)). Liver Function Tests: Recent Labs  Lab 05/06/17 1656  AST 19  ALT 12*  ALKPHOS 114  BILITOT 0.4  PROT 6.7  ALBUMIN 3.4*   Recent Labs  Lab 05/06/17 1656  LIPASE 24   No results for input(s): AMMONIA in the last 168 hours. Coagulation Profile: No results for input(s): INR, PROTIME in the last 168 hours. Cardiac Enzymes: No results for input(s): CKTOTAL, CKMB, CKMBINDEX, TROPONINI in the last 168 hours. BNP (last 3 results) No results for input(s): PROBNP in the last 8760 hours. HbA1C: No results for input(s): HGBA1C in the last 72 hours. CBG: No results for input(s): GLUCAP in the last 168 hours. Lipid Profile: No results for input(s): CHOL, HDL, LDLCALC, TRIG, CHOLHDL, LDLDIRECT in the last 72  hours. Thyroid Function Tests: No results for input(s): TSH, T4TOTAL, FREET4, T3FREE, THYROIDAB in the last 72 hours. Anemia Panel: No results for input(s): VITAMINB12, FOLATE, FERRITIN, TIBC, IRON, RETICCTPCT in the last 72 hours. Urine analysis:    Component Value Date/Time   COLORURINE COLORLESS (A) 03/04/2017 0017   APPEARANCEUR CLEAR 03/04/2017 0017   LABSPEC 1.006 03/04/2017 0017   PHURINE 7.0 03/04/2017 0017   GLUCOSEU NEGATIVE 03/04/2017 0017   HGBUR NEGATIVE 03/04/2017 0017   BILIRUBINUR NEGATIVE 03/04/2017 0017   KETONESUR NEGATIVE 03/04/2017 0017   PROTEINUR NEGATIVE 03/04/2017 0017   NITRITE NEGATIVE 03/04/2017 0017   LEUKOCYTESUR NEGATIVE 03/04/2017 0017   Sepsis Labs: No results found for this or any previous visit (from the past 240 hour(s)).   Radiological Exams on Admission: Ct Chest Wo Contrast  Result Date: 05/06/2017 CLINICAL DATA:  Evaluate for possible esophageal injury. EXAM: CT CHEST WITHOUT CONTRAST TECHNIQUE:  Multidetector CT imaging of the chest was performed following the standard protocol without IV contrast. COMPARISON:  CT abdomen, same date. FINDINGS: Cardiovascular: The heart is within normal limits in size for age. No pericardial effusion. There is tortuosity, ectasia and calcification of the thoracic aorta. Three-vessel coronary artery calcifications are noted. Mediastinum/Nodes: No mediastinal lymphadenopathy. The distal esophagus demonstrates extensive wall thickening but no esophageal leak/tear/rupture is identified. No obvious deep esophageal ulcer is demonstrated. Lungs/Pleura: Patchy areas of atelectasis and basilar scarring changes but no infiltrates, pneumothorax or pneumomediastinum. Upper Abdomen: No significant upper abdominal findings. The stomach is grossly. Musculoskeletal: No significant bony findings. IMPRESSION: 1. No CT findings for esophageal leak/tear/rupture. 2. Markedly thickened distal esophageal wall suggesting severe esophagitis without obvious ulcer. 3. No significant pulmonary findings. Patchy areas of atelectasis and mild basilar scarring. Aortic Atherosclerosis (ICD10-I70.0). Electronically Signed   By: Marijo Sanes M.D.   On: 05/06/2017 20:38   Ct Abdomen Pelvis W Contrast  Result Date: 05/06/2017 CLINICAL DATA:  Abdominal pain hematemesis since 7 o'clock this morning. EXAM: CT ABDOMEN AND PELVIS WITH CONTRAST TECHNIQUE: Multidetector CT imaging of the abdomen and pelvis was performed using the standard protocol following bolus administration of intravenous contrast. CONTRAST:  < 100 cc ISOVUE-300 IOPAMIDOL (ISOVUE-300) INJECTION 61% COMPARISON:  CT scan 11/26/2016 FINDINGS: Lower chest: The lung bases are grossly clear. No pleural effusion. Minimal subpleural atelectasis. The heart is normal in size.  No pericardial effusion. Significant wall thickening and edema involving the distal esophagus. Patient has a history of prior bleeding esophageal ulcers on endoscopy from  September 2018. There is also a small hiatal hernia and there appears to be a small amount of fluid around the esophagus. Could not exclude a vomiting associated distal esophageal injury. Hepatobiliary: No focal hepatic lesions or intrahepatic biliary dilatation. A few tiny low-attenuation lesions are likely cysts. Gallbladder appears normal. No common bile duct dilatation. Pancreas: No mass, inflammation or duct dilatation. Spleen: Normal size.  No focal lesions. Adrenals/Urinary Tract: The adrenal glands are normal. Small renal cysts. No worrisome renal lesions, renal calculi or hydronephrosis. No obstructing ureteral calculi or bladder calculi. Stomach/Bowel: The stomach is grossly normal. No obvious ulcer. The duodenum, small bowel and colon are grossly normal without oral contrast. Scattered colonic diverticulosis. The terminal ileum is normal. The appendix is normal. Vascular/Lymphatic: Scattered atherosclerotic calcifications involving the aorta but no aneurysm or dissection. The major venous structures are patent. No mesenteric or retroperitoneal mass or adenopathy. Reproductive: The prostate gland and seminal vesicles are unremarkable. Other: No pelvic mass or adenopathy. No free pelvic fluid collections. No  inguinal mass or adenopathy. No abdominal wall hernia or subcutaneous lesions. Musculoskeletal: No significant bony findings. A right hip prosthesis is noted with significant artifact through the lower pelvis. Remote compression deformity of L1 is noted. IMPRESSION: Markedly thickened distal esophagus with some surrounding fluid. Could not exclude a vomiting related esophageal injury. A chest CT with ingestion of small amount of water-soluble oral contrast may be helpful to exclude an esophageal leak. Patient also had esophageal ulcers on endoscopy from September. No acute abdominal/ pelvic findings, mass lesions or adenopathy. Electronically Signed   By: Marijo Sanes M.D.   On: 05/06/2017 18:52     EKG: Independently reviewed.  Sinus tachycardia at 112 bpm  Assessment/Plan Upper GI bleed with hematemesis, GERD with severe esophagitis: Acute.  Patient presents with multiple episodes of coffee-ground emesis.  Gastroenterology was consulted and will see patient in a.m. - Admit to stepdown bed - Typed and screened for possible need of blood products - Protonix IV BID  - NPO - IV Reglan - IV fluids at 100 mL/h as tolerated - Serial H&H - Appreciate GI consultative services, follow-up for further recommendations  Iron deficiency anemia: Patient's hemoglobin on admission 10.5 which is improved from previous discharge on 10/17 of 8.4.   - Continue to monitor and will transfuse as needed  Leukocytosis: Acute. WBC 14.3 no clear sign of infection - Recheck CBC in a.m.  Epigastric pain: Acute.  CT scan shows severe esophagitis, but no signs of significant rupture or abdominal pathology. - Morphine IV as needed  Hyperkalemia: Acute.  Potassium initially 5.2 on admission.  No significant EKG changes. - Continue with IV fluids  - recheck  BMP in a.m.   Chronic kidney disease stage III: Baseline creatinine appears to be around 1.2-1.3.  Patient presents with a creatinine of 1.41 and BUN 18 on admission. - Recheck BMP in a.m.  Refractory hiccups: Chronic.  Patient normally on oral Thorazine and baclofen. - Thorazine IV prn - Reglan IV q8 hr  Diastolic CHF: Patient appears to be last EF 55-70% with grade 1dFx in 03/05/2017. - Monitor ins and outs  History of CVA  DVT prophylaxis: scds Code Status: Full  Family Communication: Discussed plan of care with the patient and family at bedside Disposition Plan: Discharge home once medically stable in 1-2 days Consults called: GI, Dr. Hilarie Fredrickson Admission status: Observation  Norval Morton MD [facility] Pager [phone]   If 7PM-7AM, please contact night-coverage [email or web address] Password [email or web address]  05/06/2017, 10:30 PM

## 2017-05-06 NOTE — Telephone Encounter (Signed)
The pt's son states that the pt has been vomiting "black blood"  He has a history of GI bleed in October.  He has abd pain that started on Tuesday.  Pt was advised to go to the ED for evaluation.  Also, a follow up appt has been made with Dr Carlean Purl.

## 2017-05-06 NOTE — ED Notes (Signed)
Admitting MD bedside, pt and family updated on plan of care

## 2017-05-06 NOTE — ED Triage Notes (Signed)
Pt arrives to ED from home via EMS with complaints of abdominal pain and hematemesis since 0700 this morning. Pt states he has has coffee ground emesis x4 today, has hx of bleeding stomach ulcers that he has been hospitalized for in the past. Pt placed in position of comfort with bed locked and lowered, call bell in reach. Family bedside, preferred  Means of translation (hindi primary language).

## 2017-05-07 ENCOUNTER — Encounter (HOSPITAL_COMMUNITY): Payer: Self-pay

## 2017-05-07 DIAGNOSIS — Z8719 Personal history of other diseases of the digestive system: Secondary | ICD-10-CM | POA: Diagnosis not present

## 2017-05-07 DIAGNOSIS — R933 Abnormal findings on diagnostic imaging of other parts of digestive tract: Secondary | ICD-10-CM | POA: Diagnosis not present

## 2017-05-07 DIAGNOSIS — K209 Esophagitis, unspecified: Secondary | ICD-10-CM | POA: Diagnosis present

## 2017-05-07 DIAGNOSIS — K920 Hematemesis: Secondary | ICD-10-CM | POA: Diagnosis present

## 2017-05-07 DIAGNOSIS — Z79899 Other long term (current) drug therapy: Secondary | ICD-10-CM | POA: Diagnosis not present

## 2017-05-07 DIAGNOSIS — D500 Iron deficiency anemia secondary to blood loss (chronic): Secondary | ICD-10-CM | POA: Diagnosis not present

## 2017-05-07 DIAGNOSIS — D72829 Elevated white blood cell count, unspecified: Secondary | ICD-10-CM | POA: Diagnosis present

## 2017-05-07 DIAGNOSIS — K5730 Diverticulosis of large intestine without perforation or abscess without bleeding: Secondary | ICD-10-CM | POA: Diagnosis present

## 2017-05-07 DIAGNOSIS — R066 Hiccough: Secondary | ICD-10-CM | POA: Diagnosis present

## 2017-05-07 DIAGNOSIS — E785 Hyperlipidemia, unspecified: Secondary | ICD-10-CM | POA: Diagnosis present

## 2017-05-07 DIAGNOSIS — K449 Diaphragmatic hernia without obstruction or gangrene: Secondary | ICD-10-CM | POA: Diagnosis present

## 2017-05-07 DIAGNOSIS — I5032 Chronic diastolic (congestive) heart failure: Secondary | ICD-10-CM | POA: Diagnosis present

## 2017-05-07 DIAGNOSIS — K2210 Ulcer of esophagus without bleeding: Secondary | ICD-10-CM | POA: Diagnosis present

## 2017-05-07 DIAGNOSIS — E875 Hyperkalemia: Secondary | ICD-10-CM | POA: Diagnosis present

## 2017-05-07 DIAGNOSIS — I2510 Atherosclerotic heart disease of native coronary artery without angina pectoris: Secondary | ICD-10-CM | POA: Diagnosis present

## 2017-05-07 DIAGNOSIS — R1013 Epigastric pain: Secondary | ICD-10-CM | POA: Diagnosis not present

## 2017-05-07 DIAGNOSIS — K210 Gastro-esophageal reflux disease with esophagitis: Secondary | ICD-10-CM | POA: Diagnosis present

## 2017-05-07 DIAGNOSIS — D509 Iron deficiency anemia, unspecified: Secondary | ICD-10-CM | POA: Diagnosis present

## 2017-05-07 DIAGNOSIS — N183 Chronic kidney disease, stage 3 (moderate): Secondary | ICD-10-CM | POA: Diagnosis present

## 2017-05-07 DIAGNOSIS — Z8711 Personal history of peptic ulcer disease: Secondary | ICD-10-CM | POA: Diagnosis not present

## 2017-05-07 DIAGNOSIS — R000 Tachycardia, unspecified: Secondary | ICD-10-CM | POA: Diagnosis present

## 2017-05-07 DIAGNOSIS — Z8673 Personal history of transient ischemic attack (TIA), and cerebral infarction without residual deficits: Secondary | ICD-10-CM | POA: Diagnosis not present

## 2017-05-07 LAB — BASIC METABOLIC PANEL
ANION GAP: 6 (ref 5–15)
BUN: 16 mg/dL (ref 6–20)
CO2: 27 mmol/L (ref 22–32)
CREATININE: 1.4 mg/dL — AB (ref 0.61–1.24)
Calcium: 8.5 mg/dL — ABNORMAL LOW (ref 8.9–10.3)
Chloride: 106 mmol/L (ref 101–111)
GFR, EST AFRICAN AMERICAN: 54 mL/min — AB (ref >60)
GFR, EST NON AFRICAN AMERICAN: 46 mL/min — AB (ref >60)
Glucose, Bld: 104 mg/dL — ABNORMAL HIGH (ref 65–99)
Potassium: 4.2 mmol/L (ref 3.5–5.1)
SODIUM: 139 mmol/L (ref 135–145)

## 2017-05-07 LAB — CBC
HEMATOCRIT: 32.1 % — AB (ref 39.0–52.0)
Hemoglobin: 9.5 g/dL — ABNORMAL LOW (ref 13.0–17.0)
MCH: 21.7 pg — ABNORMAL LOW (ref 26.0–34.0)
MCHC: 29.6 g/dL — AB (ref 30.0–36.0)
MCV: 73.5 fL — ABNORMAL LOW (ref 78.0–100.0)
PLATELETS: 283 10*3/uL (ref 150–400)
RBC: 4.37 MIL/uL (ref 4.22–5.81)
RDW: 17.5 % — AB (ref 11.5–15.5)
WBC: 8 10*3/uL (ref 4.0–10.5)

## 2017-05-07 MED ORDER — SUCRALFATE 1 GM/10ML PO SUSP
1.0000 g | Freq: Three times a day (TID) | ORAL | Status: DC
  Administered 2017-05-07 – 2017-05-08 (×4): 1 g via ORAL
  Filled 2017-05-07 (×4): qty 10

## 2017-05-07 MED ORDER — BACLOFEN 10 MG PO TABS
10.0000 mg | ORAL_TABLET | Freq: Three times a day (TID) | ORAL | Status: DC
  Administered 2017-05-07 – 2017-05-08 (×4): 10 mg via ORAL
  Filled 2017-05-07 (×4): qty 1

## 2017-05-07 MED ORDER — ONDANSETRON HCL 4 MG PO TABS
4.0000 mg | ORAL_TABLET | Freq: Three times a day (TID) | ORAL | Status: DC
  Administered 2017-05-07 – 2017-05-10 (×8): 4 mg via ORAL
  Filled 2017-05-07 (×8): qty 1

## 2017-05-07 MED ORDER — ONDANSETRON HCL 4 MG/2ML IJ SOLN
4.0000 mg | Freq: Three times a day (TID) | INTRAMUSCULAR | Status: DC
  Administered 2017-05-07 – 2017-05-09 (×3): 4 mg via INTRAVENOUS
  Filled 2017-05-07 (×4): qty 2

## 2017-05-07 MED ORDER — SODIUM CHLORIDE 0.9 % IV SOLN
510.0000 mg | Freq: Once | INTRAVENOUS | Status: AC
  Administered 2017-05-07: 510 mg via INTRAVENOUS
  Filled 2017-05-07: qty 17

## 2017-05-07 MED ORDER — RANITIDINE HCL 150 MG/10ML PO SYRP
150.0000 mg | ORAL_SOLUTION | Freq: Two times a day (BID) | ORAL | Status: DC
  Administered 2017-05-07 – 2017-05-08 (×3): 150 mg via ORAL
  Filled 2017-05-07 (×3): qty 10

## 2017-05-07 NOTE — Progress Notes (Signed)
PROGRESS NOTE    Logan [name]  FBP:[id number] DOB: 1934-08-19 DOA: 05/06/2017 PCP: No primary care provider on file.  Brief Narrative: 82 y.o. male with medical history significant of iron deficiency anemia, chronic hiccups, diastolic CHF last EF 82-42% with grade 1dFx, GERD with esophagitis, CVA, and CKD stage III; who presents after having 4 episodes of coffee ground emesis today. Family helps translate. He also complains of epigastric pain which started after he started vomiting.  Denies being on blood thinners or utilizing any NSAIDs or alcohol.  Associated symptoms include chronic hiccups despite medications of Thorazine and baclofen.  He is followed by Dr. Arelia Longest of gastroenterology in the outpatient setting.  They note that this is his fourth admission in the last 2 months for the same. Last admitted to the hospital from 10/14-10/17, and his last EGD was on 03/04/17 showed linear, esophageal ulcers, antral erosions, and pathology confirmed ulcerative esophagitis.  Patient has been evaluated with MRIs of the brain which showed no acute abnormalities.    ED Course: Upon admission into the emergency department patient was seen to have a temperature of 99.3 F, pulse 115-123, respirations 15-18, and all other vital signs maintained.  Labs revealed WBC 14.7, hemoglobin 10.5, potassium 5.2, BUN 18, and creatinine 1.41.  Stool guaiac was negative.  CT scans of the chest and abdomen revealed thickened esophagus suggestive of severe esophagitis with no clear visible ulcer.  While in the emergency department he received 2 L of IV fluids, morphine, 40 mg of Protonix IV, and Phenergan.  Dr.Pyrtle of gastroenterology was consulted and will see the patient in a.m.  [facility] called to admit. Patient reports having no further hematemesis.    Assessment & Plan:   Principal Problem:   Hematemesis Active Problems:   Intractable hiccups   Gastroesophageal reflux disease with esophagitis   Acute upper GI  bleed   Hyperkalemia   Abnormal CT scan, esophagus  Upper GI bleed with in a patient with known history of severe esophagitis appreciate GI input continue twice a day Protonix follow-up H&H IV iron Carafate and Zantac 300 mg twice a day will DC Reglan    DVT scd :Code Status: full Family Communication: dw daughter Disposition Plan: tbd   Consultants:  gi  Procedures:none Antimicrobials: none Subjective:complaints of hiccups   Objective: Vitals:   05/06/17 2300 05/06/17 2353 05/07/17 0400 05/07/17 1146  BP: 130/87 133/80 108/64 97/61  Pulse: (!) 121 (!) 121 91 (!) 117  Resp: 13 18 18 20   Temp:  98.8 F (37.1 C) 97.6 F (36.4 C) 98.3 F (36.8 C)  TempSrc:  Oral Oral Oral  SpO2: 100% 100% 99% 100%  Weight:  57.8 kg (127 lb 8 oz)    Height:        Intake/Output Summary (Last 24 hours) at 05/07/2017 1625 Last data filed at 05/07/2017 1408 Gross per 24 hour  Intake 385 ml  Output 200 ml  Net 185 ml   Filed Weights   05/06/17 1621 05/06/17 2353  Weight: 58.5 kg (129 lb) 57.8 kg (127 lb 8 oz)    Examination:  General exam: Appears calm and comfortable  Respiratory system: Clear to auscultation. Respiratory effort normal. Cardiovascular system: S1 & S2 heard, RRR. No JVD, murmurs, rubs, gallops or clicks. No pedal edema. Gastrointestinal system: Abdomen is nondistended, soft and nontender. No organomegaly or masses felt. Normal bowel sounds heard. Central nervous system: Alert and oriented. No focal neurological deficits. Extremities: Symmetric 5 x 5 power. Skin: No rashes,  lesions or ulcers Psychiatry: Judgement and insight appear normal. Mood & affect appropriate.     Data Reviewed: I have personally reviewed following labs and imaging studies  CBC: Recent Labs  Lab 05/06/17 1656 05/06/17 2257 05/07/17 0605  WBC 14.7*  --  8.0  NEUTROABS 12.7*  --   --   HGB 10.5* 9.9* 9.5*  HCT 35.4* 33.1* 32.1*  MCV 73.6*  --  73.5*  PLT 360  --  782   Basic  Metabolic Panel: Recent Labs  Lab 05/06/17 1656 05/07/17 0605  NA 134* 139  K 5.2* 4.2  CL 99* 106  CO2 27 27  GLUCOSE 146* 104*  BUN 18 16  CREATININE 1.41* 1.40*  CALCIUM 8.9 8.5*   GFR: Estimated Creatinine Clearance: 35 mL/min (A) (by C-G formula based on SCr of 1.4 mg/dL (H)). Liver Function Tests: Recent Labs  Lab 05/06/17 1656  AST 19  ALT 12*  ALKPHOS 114  BILITOT 0.4  PROT 6.7  ALBUMIN 3.4*   Recent Labs  Lab 05/06/17 1656  LIPASE 24   No results for input(s): AMMONIA in the last 168 hours. Coagulation Profile: No results for input(s): INR, PROTIME in the last 168 hours. Cardiac Enzymes: No results for input(s): CKTOTAL, CKMB, CKMBINDEX, TROPONINI in the last 168 hours. BNP (last 3 results) No results for input(s): PROBNP in the last 8760 hours. HbA1C: No results for input(s): HGBA1C in the last 72 hours. CBG: No results for input(s): GLUCAP in the last 168 hours. Lipid Profile: No results for input(s): CHOL, HDL, LDLCALC, TRIG, CHOLHDL, LDLDIRECT in the last 72 hours. Thyroid Function Tests: No results for input(s): TSH, T4TOTAL, FREET4, T3FREE, THYROIDAB in the last 72 hours. Anemia Panel: No results for input(s): VITAMINB12, FOLATE, FERRITIN, TIBC, IRON, RETICCTPCT in the last 72 hours. Sepsis Labs: No results for input(s): PROCALCITON, LATICACIDVEN in the last 168 hours.  No results found for this or any previous visit (from the past 240 hour(s)).       Radiology Studies: Ct Chest Wo Contrast  Result Date: 05/06/2017 CLINICAL DATA:  Evaluate for possible esophageal injury. EXAM: CT CHEST WITHOUT CONTRAST TECHNIQUE: Multidetector CT imaging of the chest was performed following the standard protocol without IV contrast. COMPARISON:  CT abdomen, same date. FINDINGS: Cardiovascular: The heart is within normal limits in size for age. No pericardial effusion. There is tortuosity, ectasia and calcification of the thoracic aorta. Three-vessel coronary  artery calcifications are noted. Mediastinum/Nodes: No mediastinal lymphadenopathy. The distal esophagus demonstrates extensive wall thickening but no esophageal leak/tear/rupture is identified. No obvious deep esophageal ulcer is demonstrated. Lungs/Pleura: Patchy areas of atelectasis and basilar scarring changes but no infiltrates, pneumothorax or pneumomediastinum. Upper Abdomen: No significant upper abdominal findings. The stomach is grossly. Musculoskeletal: No significant bony findings. IMPRESSION: 1. No CT findings for esophageal leak/tear/rupture. 2. Markedly thickened distal esophageal wall suggesting severe esophagitis without obvious ulcer. 3. No significant pulmonary findings. Patchy areas of atelectasis and mild basilar scarring. Aortic Atherosclerosis (ICD10-I70.0). Electronically Signed   By: Marijo Sanes M.D.   On: 05/06/2017 20:38   Ct Abdomen Pelvis W Contrast  Result Date: 05/06/2017 CLINICAL DATA:  Abdominal pain hematemesis since 7 o'clock this morning. EXAM: CT ABDOMEN AND PELVIS WITH CONTRAST TECHNIQUE: Multidetector CT imaging of the abdomen and pelvis was performed using the standard protocol following bolus administration of intravenous contrast. CONTRAST:  < 100 cc ISOVUE-300 IOPAMIDOL (ISOVUE-300) INJECTION 61% COMPARISON:  CT scan 11/26/2016 FINDINGS: Lower chest: The lung bases are grossly clear.  No pleural effusion. Minimal subpleural atelectasis. The heart is normal in size.  No pericardial effusion. Significant wall thickening and edema involving the distal esophagus. Patient has a history of prior bleeding esophageal ulcers on endoscopy from September 2018. There is also a small hiatal hernia and there appears to be a small amount of fluid around the esophagus. Could not exclude a vomiting associated distal esophageal injury. Hepatobiliary: No focal hepatic lesions or intrahepatic biliary dilatation. A few tiny low-attenuation lesions are likely cysts. Gallbladder appears  normal. No common bile duct dilatation. Pancreas: No mass, inflammation or duct dilatation. Spleen: Normal size.  No focal lesions. Adrenals/Urinary Tract: The adrenal glands are normal. Small renal cysts. No worrisome renal lesions, renal calculi or hydronephrosis. No obstructing ureteral calculi or bladder calculi. Stomach/Bowel: The stomach is grossly normal. No obvious ulcer. The duodenum, small bowel and colon are grossly normal without oral contrast. Scattered colonic diverticulosis. The terminal ileum is normal. The appendix is normal. Vascular/Lymphatic: Scattered atherosclerotic calcifications involving the aorta but no aneurysm or dissection. The major venous structures are patent. No mesenteric or retroperitoneal mass or adenopathy. Reproductive: The prostate gland and seminal vesicles are unremarkable. Other: No pelvic mass or adenopathy. No free pelvic fluid collections. No inguinal mass or adenopathy. No abdominal wall hernia or subcutaneous lesions. Musculoskeletal: No significant bony findings. A right hip prosthesis is noted with significant artifact through the lower pelvis. Remote compression deformity of L1 is noted. IMPRESSION: Markedly thickened distal esophagus with some surrounding fluid. Could not exclude a vomiting related esophageal injury. A chest CT with ingestion of small amount of water-soluble oral contrast may be helpful to exclude an esophageal leak. Patient also had esophageal ulcers on endoscopy from September. No acute abdominal/ pelvic findings, mass lesions or adenopathy. Electronically Signed   By: Marijo Sanes M.D.   On: 05/06/2017 18:52        Scheduled Meds: . baclofen  10 mg Oral TID  . ondansetron (ZOFRAN) IV  4 mg Intravenous Q8H   Or  . ondansetron  4 mg Oral Q8H  . pantoprazole (PROTONIX) IV  40 mg Intravenous Q12H  . ranitidine  150 mg Oral BID  . sucralfate  1 g Oral TID BM   Continuous Infusions: . chlorproMAZINE (THORAZINE) IV Stopped (05/07/17  0530)  . ferumoxytol       LOS: 0 days      Georgette Shell, MD [facility]  If 7PM-7AM, please contact night-coverage [email or web address] Password [email or web address] 05/07/2017, 4:25 PM

## 2017-05-07 NOTE — Consult Note (Signed)
Consultation  Referring Provider: [place] M.D./David Darl Householder M.D .Primary Care Physician:  No primary care provider on file. Primary Gastroenterologist:  Dr.Gessner  Reason for Consultation:  hematemesis  HPI: Logan [name] is a 82 y.o. male who was admitted last evening through the emergency room after family had called our office stating that the patient had been vomiting black material. Patient is known to Dr. Carlean Purl and was admitted to the hospital in September 2018 with GI bleeding. He had undergone upper endoscopy at that time per Dr. Silverio Decamp with finding of multiple linear ulcers with oozing of blood from 30-35 cm, the largest ulcer was 20 mm. He has large hiatal hernia measuring 7 cm and had some small erosions in the gastric antrum. He was to stay on twice daily Protonix, Zantac 300 daily at bedtime and Carafate suspension 4 times daily.  Labs in the ER last evening WBC of 14.7, hemoglobin 10.5 hematocrit of 35.4, MCV of 73.6, LFTs within normal limits his documented heme negative. Because of complaints of epigastric pain he had CT of the abdomen and pelvis done which showed significant wall thickening and edema of the distal esophagus, hiatal hernia and a small amount of fluid around the esophagus. Gallbladder and liver normal colonic diverticulosis. Subsequent water contrast chest CT to rule out esophageal perforation showed extensive wall thickening of the distal esophagus, there is no evidence of leak, tear,or deep ulcer. No pneumomediastinum and no pneumonia. Hemoglobin has drifted to 9.9 since admit.  Patient speaks very little [place] but relates that he has been hiccuping a lot. I spoke to his son by phone who states that he has been taking his medication as directed, including Protonix twice daily, Zantac 300 milligrams at bedtime and Carafate suspension 4 times daily. He is only been complaining over the past couple of days with epigastric discomfort and nausea. Hiccups  have  increased. He had been eating until yesterday. He vomited about 4 times with small amounts of black material yesterday.   Past Medical History:  Diagnosis Date  . Anemia   . Cataract   . Esophageal ulcer   . Gastritis    proximal  . Hiatal hernia   . Hyperlipidemia   . Intractable hiccups   . Reflux esophagitis   . Stroke [facility])    r side deficits; September 2017  . Upper GI bleed     Past Surgical History:  Procedure Laterality Date  . COLONOSCOPY    . ESOPHAGOGASTRODUODENOSCOPY    . UPPER GASTROINTESTINAL ENDOSCOPY      Prior to Admission medications   Medication Sig Start Date End Date Taking? Authorizing Provider  Baclofen 5 MG TABS Take 5 mg by mouth 3 (three) times daily. 03/17/17  Yes [name], MD  chlorproMAZINE (THORAZINE) 25 MG tablet Take 1 tablet (25 mg total) by mouth 3 (three) times daily as needed. 03/09/17  Yes [name], MD  famotidine (PEPCID) 40 MG/5ML suspension Take 1.3 mLs (10.4 mg total) by mouth at bedtime. 04/13/17  Yes Hongalgi, Lenis Dickinson, MD  feeding supplement, ENSURE ENLIVE, (ENSURE ENLIVE) LIQD Take 237 mLs by mouth 2 (two) times daily between meals. 04/13/17  Yes Hongalgi, Lenis Dickinson, MD  ondansetron (ZOFRAN ODT) 4 MG disintegrating tablet Take 1 tablet (4 mg total) by mouth every 6 (six) hours as needed for nausea or vomiting. 04/13/17  Yes Hongalgi, Lenis Dickinson, MD  pantoprazole sodium (PROTONIX) 40 mg/20 mL PACK Take 20 mLs (40 mg total) by mouth 2 (  two) times daily. 04/13/17  Yes Hongalgi, Lenis Dickinson, MD  sucralfate (CARAFATE) 1 GM/10ML suspension Take 10 mLs (1 g total) by mouth 4 (four) times daily. 04/13/17 04/13/18 Yes Hongalgi, Lenis Dickinson, MD    Current Facility-Administered Medications  Medication Dose Route Frequency Provider Last Rate Last Dose  . acetaminophen (TYLENOL) tablet 650 mg  650 mg Oral Q6H PRN Norval Morton, MD       Or  . acetaminophen (TYLENOL) suppository 650 mg  650 mg Rectal Q6H PRN Smith, Rondell A, MD      .  albuterol (PROVENTIL) (2.5 MG/3ML) 0.083% nebulizer solution 2.5 mg  2.5 mg Nebulization Q4H PRN Smith, Rondell A, MD      . chlorproMAZINE (THORAZINE) 25 mg in sodium chloride 0.9 % 25 mL IVPB  25 mg Intravenous Q8H PRN Norval Morton, MD   Stopped at 05/07/17 0530  . metoCLOPramide (REGLAN) injection 10 mg  10 mg Intravenous Q8H Smith, Rondell A, MD   10 mg at 05/07/17 0535  . morphine 4 MG/ML injection 2 mg  2 mg Intravenous Q2H PRN Smith, Rondell A, MD      . ondansetron (ZOFRAN) tablet 4 mg  4 mg Oral Q6H PRN Fuller Plan A, MD       Or  . ondansetron (ZOFRAN) injection 4 mg  4 mg Intravenous Q6H PRN Smith, Rondell A, MD      . pantoprazole (PROTONIX) injection 40 mg  40 mg Intravenous Q12H Smith, Rondell A, MD   40 mg at 05/07/17 0533    Allergies as of 05/06/2017  . (No Known Allergies)    Family History  Problem Relation Age of Onset  . Colon cancer Neg Hx   . Esophageal cancer Neg Hx   . Rectal cancer Neg Hx     Social History   Socioeconomic History  . Marital status: Married    Spouse name: Not on file  . Number of children: Not on file  . Years of education: Not on file  . Highest education level: Not on file  Social Needs  . Financial resource strain: Not on file  . Food insecurity - worry: Not on file  . Food insecurity - inability: Not on file  . Transportation needs - medical: Not on file  . Transportation needs - non-medical: Not on file  Occupational History  . Occupation: retired  Tobacco Use  . Smoking status: Never Smoker  . Smokeless tobacco: Never Used  Substance and Sexual Activity  . Alcohol use: No  . Drug use: No  . Sexual activity: Not on file  Other Topics Concern  . Not on file  Social History Narrative   Married 2 sons (at least)   Spends time with them in [place] and [place]    Review of Systems: Pertinent positive and negative review of systems were noted in the above HPI section.  All other review of systems was otherwise  negative.  Physical Exam: Vital signs in last 24 hours: Temp:  [97.6 F (36.4 C)-99.3 F (37.4 C)] 97.6 F (36.4 C) (11/10 0400) Pulse Rate:  [91-123] 91 (11/10 0400) Resp:  [13-18] 18 (11/10 0400) BP: (108-139)/(64-94) 108/64 (11/10 0400) SpO2:  [98 %-100 %] 99 % (11/10 0400) Weight:  [127 lb 8 oz (57.8 kg)-129 lb (58.5 kg)] 127 lb 8 oz (57.8 kg) (11/09 2353)   General:   Alert,  Well-developed, well-nourished, elderly [place] male pleasant and cooperative in NAD intermittent hiccups Head:  Normocephalic and atraumatic.  Eyes:  Sclera clear, no icterus.   Conjunctiva pink. Ears:  Normal auditory acuity. Nose:  No deformity, discharge,  or lesions. Mouth:  No deformity or lesions.   Neck:  Supple; no masses or thyromegaly. Lungs:  Clear throughout to auscultation.   No wheezes, crackles, or rhonchi. Heart:  Regular rate and rhythm; no murmurs, clicks, rubs,  or gallops. Abdomen:  Soft,nontender, no focal tenderness no guarding or rebound BS active,nonpalp mass or hsm.   Rectal:  Deferred , documented heme negative on admission Msk:  Symmetrical without gross deformities. . Pulses:  Normal pulses noted. Extremities:  Without clubbing or edema. Neurologic:  Alert and  oriented x4;  grossly normal neurologically. Skin:  Intact without significant lesions or rashes.. Psych:  Alert and cooperative. Normal mood and affect.  Intake/Output from previous day: 11/09 0701 - 11/10 0700 In: 25 [IV Piggyback:25] Out: 200 [Urine:200] Intake/Output this shift: No intake/output data recorded.  Lab Results: Recent Labs    05/06/17 1656 05/06/17 2257 05/07/17 0605  WBC 14.7*  --  8.0  HGB 10.5* 9.9* 9.5*  HCT 35.4* 33.1* 32.1*  PLT 360  --  283   BMET Recent Labs    05/06/17 1656 05/07/17 0605  NA 134* 139  K 5.2* 4.2  CL 99* 106  CO2 27 27  GLUCOSE 146* 104*  BUN 18 16  CREATININE 1.41* 1.40*  CALCIUM 8.9 8.5*   LFT Recent Labs    05/06/17 1656  PROT 6.7  ALBUMIN 3.4*   AST 19  ALT 12*  ALKPHOS 114  BILITOT 0.4   PT/INR No results for input(s): LABPROT, INR in the last 72 hours. Hepatitis Panel No results for input(s): HEPBSAG, HCVAB, HEPAIGM, HEPBIGM in the last 72 hours.    IMPRESSION:  #86 82 year old [place] male non-English speaking admitted with coffee-ground emesis, hiccups and complaints of epigastric pain over the previous 24 hours. Patient is known to GI and had EGD in September 2018 for similar complaints and was found to have severe distal esophagitis with multiple linear ulcers and oozing of blood from 30-35 cm, he also has a 7 cm hiatal hernia. He has been on medical treatment over the past 2 months and compliant as best we can ascertain. Workup this admission with CT imaging shows significant wall thickening and edema of the distal esophagus, there was small amount of fluid around the esophagus on CT of the abdomen and pelvis and subsequent chest CT did not show any evidence of leak or tear.  He has persistent severe esophagitis with ulceration as cause for his hematemesis. Suspect his large hiatal hernia is contributing to persistence, and hiccups are likely secondary to severe esophagitis.  #2 acute and chronic microcytic anemia secondary to chronic GI from severe esophagitis #3 iron deficiency-would treat with IV iron during this admission #4 history of CVA  Plan; Do not plan repeat EGD at this time, he has known severe distal esophagitis and ulceration Start full liquid diet Keep head of bed elevated 60 at all times and encourage patient to be upright during the day IV Protonix twice a day Resume Carafate suspension 4 times daily between meals and at bedtime Add Zantac 300 mg twice daily Had baclofen 10 mg by mouth 3 times a day for hiccups Would try to avoid metoclopramide Have changed to Zofran to 4 mg IV every 8 hours around-the-clock Please order IV Fereheme infusion Thank you Will follow with you  Clementine Soulliere  05/07/2017, 9:17 AM

## 2017-05-08 DIAGNOSIS — K209 Esophagitis, unspecified: Secondary | ICD-10-CM

## 2017-05-08 MED ORDER — BACLOFEN 10 MG PO TABS
10.0000 mg | ORAL_TABLET | Freq: Three times a day (TID) | ORAL | Status: DC | PRN
  Filled 2017-05-08: qty 1

## 2017-05-08 MED ORDER — SUCRALFATE 1 GM/10ML PO SUSP
1.0000 g | Freq: Three times a day (TID) | ORAL | Status: DC
  Administered 2017-05-08 – 2017-05-10 (×10): 1 g via ORAL
  Filled 2017-05-08 (×12): qty 10

## 2017-05-08 MED ORDER — RANITIDINE HCL 150 MG/10ML PO SYRP
300.0000 mg | ORAL_SOLUTION | Freq: Two times a day (BID) | ORAL | Status: DC
  Administered 2017-05-08 – 2017-05-10 (×4): 300 mg via ORAL
  Filled 2017-05-08 (×5): qty 20

## 2017-05-08 NOTE — Plan of Care (Signed)
  Progressing Education: Knowledge of General Education information will improve 05/08/2017 0421 - Progressing by Ardine Eng, RN Health Behavior/Discharge Planning: Ability to manage health-related needs will improve 05/08/2017 0421 - Progressing by Ardine Eng, RN Clinical Measurements: Ability to maintain clinical measurements within normal limits will improve 05/08/2017 0421 - Progressing by Ardine Eng, RN Will remain free from infection 05/08/2017 0421 - Progressing by Ardine Eng, RN Diagnostic test results will improve 05/08/2017 0421 - Progressing by Ardine Eng, RN Respiratory complications will improve 05/08/2017 0421 - Progressing by Ardine Eng, RN Cardiovascular complication will be avoided 05/08/2017 0421 - Progressing by Ardine Eng, RN Activity: Risk for activity intolerance will decrease 05/08/2017 0421 - Progressing by Ardine Eng, RN Nutrition: Adequate nutrition will be maintained 05/08/2017 0421 - Progressing by Ardine Eng, RN Coping: Level of anxiety will decrease 05/08/2017 0421 - Progressing by Ardine Eng, RN Elimination: Will not experience complications related to bowel motility 05/08/2017 0421 - Progressing by Ardine Eng, RN Will not experience complications related to urinary retention 05/08/2017 0421 - Progressing by Ardine Eng, RN Pain Managment: General experience of comfort will improve 05/08/2017 0421 - Progressing by Ardine Eng, RN Safety: Ability to remain free from injury will improve 05/08/2017 0421 - Progressing by Ardine Eng, RN Skin Integrity: Risk for impaired skin integrity will decrease 05/08/2017 0421 - Progressing by Ardine Eng, RN

## 2017-05-08 NOTE — Progress Notes (Signed)
PROGRESS NOTE    Logan [name]  SWF:[id number] DOB: September 10, 1934 DOA: 05/06/2017 PCP: No primary care provider on file.  Brief Narrative: 82 y.o.malewith medical history significant ofiron deficiency anemia, chronic hiccups, diastolic CHF last EF 22-02% with grade 1dFx, GERD with esophagitis, CVA,andCKD stage III;who presents after having 4 episodes ofcoffee ground emesis today.Family helps translate. He also complains of epigastric pain which started after he started vomiting. Denies being on blood thinners or utilizing any NSAIDs or alcohol. Associated symptoms include chronic hiccups despite medications of Thorazine and baclofen. He is followed by Dr. Arelia Longest of gastroenterology in the outpatient setting. They note that this is hisfourth admission in the last 2 months for the same. Lastadmitted to the hospital from 10/14-10/17,and his lastEGDwason 9/7/18showedlinear, esophageal ulcers,antral erosions, andpathology confirmed ulcerative esophagitis. Patient has been evaluated with MRIs of the brain which showed no acute abnormalities.    ED Course:Upon admission into the emergency department patient was seen to have a temperature of 99.3 F, pulse 115-123, respirations 15-18, and all other vital signs maintained. Labs revealed WBC 14.7, hemoglobin 10.5, potassium 5.2, BUN 18, andcreatinine 1.41.Stool guaiac was negative. CT scans of the chest and abdomen revealedthickened esophagus suggestive of severe esophagitis with no clear visible ulcer. While in the emergency department he received 2 L of IV fluids, morphine, 40 mg of Protonix IV, and Phenergan. Dr.Pyrtleof gastroenterology was consulted and will see the patient in a.m. [facility] called to admit. Patient reports having no further hematemesis     Assessment & Plan:   Principal Problem:   Hematemesis Active Problems:   Intractable hiccups   Gastroesophageal reflux disease with esophagitis   Acute upper GI  bleed   Hyperkalemia   Abnormal CT scan, esophagus  Ulcerative esophagitis with hiatal hernia/chronic hiccups- patient has not had any further hematemesis.  H&H remained stable.  Noted GI evaluation continue IV Protonix for now change to p.o. tomorrow if patient continues to be stable.  Continue ranitidine 300 mg twice a day and baclofen and Zofran.  Patient wants to lie down during the day and night he refuses to sit up and remain upright.  Patient received 1 IV Feraheme yesterday.  Discussed with patient's son.  He reports that they cannot afford to buy or Medicare does not cover Protonix.  Will get social worker consult.  Leukocytosis resolved no evidence of infection.   DVT prophylaxis: SCD Code Status: Full code Family Communication: Discussed with son Disposition Plan: TBD Consultants:  GI Procedures: None Antimicrobials: None Subjective: No new complaints no vomiting diarrhea   Objective: Sitting upright in bed eating breakfast has not had any hiccups during the time I was in the room with him Vitals:   05/07/17 1146 05/07/17 2011 05/08/17 0551 05/08/17 1100  BP: 97/61 134/81 121/82 116/70  Pulse: (!) 117 (!) 107 100 74  Resp: 20 18 18 20   Temp: 98.3 F (36.8 C) 98.8 F (37.1 C) 98 F (36.7 C) 98.2 F (36.8 C)  TempSrc: Oral Oral Oral Oral  SpO2: 100% 98% 98% 99%  Weight:   59.1 kg (130 lb 4.7 oz)   Height:        Intake/Output Summary (Last 24 hours) at 05/08/2017 1336 Last data filed at 05/08/2017 5427 Gross per 24 hour  Intake 1437 ml  Output 200 ml  Net 1237 ml   Filed Weights   05/06/17 1621 05/06/17 2353 05/08/17 0551  Weight: 58.5 kg (129 lb) 57.8 kg (127 lb 8 oz) 59.1 kg (130 lb 4.7 oz)  Examination:  General exam: Appears calm and comfortable  Respiratory system: Clear to auscultation. Respiratory effort normal. Cardiovascular system: S1 & S2 heard, RRR. No JVD, murmurs, rubs, gallops or clicks. No pedal edema. Gastrointestinal system: Abdomen  is nondistended, soft and nontender. No organomegaly or masses felt. Normal bowel sounds heard. Central nervous system: Alert and oriented. No focal neurological deficits. Extremities: Symmetric 5 x 5 power. Skin: No rashes, lesions or ulcers Psychiatry: Judgement and insight appear normal. Mood & affect appropriate.     Data Reviewed: I have personally reviewed following labs and imaging studies  CBC: Recent Labs  Lab 05/06/17 1656 05/06/17 2257 05/07/17 0605  WBC 14.7*  --  8.0  NEUTROABS 12.7*  --   --   HGB 10.5* 9.9* 9.5*  HCT 35.4* 33.1* 32.1*  MCV 73.6*  --  73.5*  PLT 360  --  962   Basic Metabolic Panel: Recent Labs  Lab 05/06/17 1656 05/07/17 0605  NA 134* 139  K 5.2* 4.2  CL 99* 106  CO2 27 27  GLUCOSE 146* 104*  BUN 18 16  CREATININE 1.41* 1.40*  CALCIUM 8.9 8.5*   GFR: Estimated Creatinine Clearance: 35.8 mL/min (A) (by C-G formula based on SCr of 1.4 mg/dL (H)). Liver Function Tests: Recent Labs  Lab 05/06/17 1656  AST 19  ALT 12*  ALKPHOS 114  BILITOT 0.4  PROT 6.7  ALBUMIN 3.4*   Recent Labs  Lab 05/06/17 1656  LIPASE 24   No results for input(s): AMMONIA in the last 168 hours. Coagulation Profile: No results for input(s): INR, PROTIME in the last 168 hours. Cardiac Enzymes: No results for input(s): CKTOTAL, CKMB, CKMBINDEX, TROPONINI in the last 168 hours. BNP (last 3 results) No results for input(s): PROBNP in the last 8760 hours. HbA1C: No results for input(s): HGBA1C in the last 72 hours. CBG: No results for input(s): GLUCAP in the last 168 hours. Lipid Profile: No results for input(s): CHOL, HDL, LDLCALC, TRIG, CHOLHDL, LDLDIRECT in the last 72 hours. Thyroid Function Tests: No results for input(s): TSH, T4TOTAL, FREET4, T3FREE, THYROIDAB in the last 72 hours. Anemia Panel: No results for input(s): VITAMINB12, FOLATE, FERRITIN, TIBC, IRON, RETICCTPCT in the last 72 hours. Sepsis Labs: No results for input(s): PROCALCITON,  LATICACIDVEN in the last 168 hours.  No results found for this or any previous visit (from the past 240 hour(s)).       Radiology Studies: Ct Chest Wo Contrast  Result Date: 05/06/2017 CLINICAL DATA:  Evaluate for possible esophageal injury. EXAM: CT CHEST WITHOUT CONTRAST TECHNIQUE: Multidetector CT imaging of the chest was performed following the standard protocol without IV contrast. COMPARISON:  CT abdomen, same date. FINDINGS: Cardiovascular: The heart is within normal limits in size for age. No pericardial effusion. There is tortuosity, ectasia and calcification of the thoracic aorta. Three-vessel coronary artery calcifications are noted. Mediastinum/Nodes: No mediastinal lymphadenopathy. The distal esophagus demonstrates extensive wall thickening but no esophageal leak/tear/rupture is identified. No obvious deep esophageal ulcer is demonstrated. Lungs/Pleura: Patchy areas of atelectasis and basilar scarring changes but no infiltrates, pneumothorax or pneumomediastinum. Upper Abdomen: No significant upper abdominal findings. The stomach is grossly. Musculoskeletal: No significant bony findings. IMPRESSION: 1. No CT findings for esophageal leak/tear/rupture. 2. Markedly thickened distal esophageal wall suggesting severe esophagitis without obvious ulcer. 3. No significant pulmonary findings. Patchy areas of atelectasis and mild basilar scarring. Aortic Atherosclerosis (ICD10-I70.0). Electronically Signed   By: Marijo Sanes M.D.   On: 05/06/2017 20:38   Ct Abdomen  Pelvis W Contrast  Result Date: 05/06/2017 CLINICAL DATA:  Abdominal pain hematemesis since 7 o'clock this morning. EXAM: CT ABDOMEN AND PELVIS WITH CONTRAST TECHNIQUE: Multidetector CT imaging of the abdomen and pelvis was performed using the standard protocol following bolus administration of intravenous contrast. CONTRAST:  < 100 cc ISOVUE-300 IOPAMIDOL (ISOVUE-300) INJECTION 61% COMPARISON:  CT scan 11/26/2016 FINDINGS: Lower chest:  The lung bases are grossly clear. No pleural effusion. Minimal subpleural atelectasis. The heart is normal in size.  No pericardial effusion. Significant wall thickening and edema involving the distal esophagus. Patient has a history of prior bleeding esophageal ulcers on endoscopy from September 2018. There is also a small hiatal hernia and there appears to be a small amount of fluid around the esophagus. Could not exclude a vomiting associated distal esophageal injury. Hepatobiliary: No focal hepatic lesions or intrahepatic biliary dilatation. A few tiny low-attenuation lesions are likely cysts. Gallbladder appears normal. No common bile duct dilatation. Pancreas: No mass, inflammation or duct dilatation. Spleen: Normal size.  No focal lesions. Adrenals/Urinary Tract: The adrenal glands are normal. Small renal cysts. No worrisome renal lesions, renal calculi or hydronephrosis. No obstructing ureteral calculi or bladder calculi. Stomach/Bowel: The stomach is grossly normal. No obvious ulcer. The duodenum, small bowel and colon are grossly normal without oral contrast. Scattered colonic diverticulosis. The terminal ileum is normal. The appendix is normal. Vascular/Lymphatic: Scattered atherosclerotic calcifications involving the aorta but no aneurysm or dissection. The major venous structures are patent. No mesenteric or retroperitoneal mass or adenopathy. Reproductive: The prostate gland and seminal vesicles are unremarkable. Other: No pelvic mass or adenopathy. No free pelvic fluid collections. No inguinal mass or adenopathy. No abdominal wall hernia or subcutaneous lesions. Musculoskeletal: No significant bony findings. A right hip prosthesis is noted with significant artifact through the lower pelvis. Remote compression deformity of L1 is noted. IMPRESSION: Markedly thickened distal esophagus with some surrounding fluid. Could not exclude a vomiting related esophageal injury. A chest CT with ingestion of small  amount of water-soluble oral contrast may be helpful to exclude an esophageal leak. Patient also had esophageal ulcers on endoscopy from September. No acute abdominal/ pelvic findings, mass lesions or adenopathy. Electronically Signed   By: Marijo Sanes M.D.   On: 05/06/2017 18:52        Scheduled Meds: . ondansetron (ZOFRAN) IV  4 mg Intravenous Q8H   Or  . ondansetron  4 mg Oral Q8H  . pantoprazole (PROTONIX) IV  40 mg Intravenous Q12H  . ranitidine  300 mg Oral BID  . sucralfate  1 g Oral TID WC & HS   Continuous Infusions: . chlorproMAZINE (THORAZINE) IV Stopped (05/07/17 0530)     LOS: 1 day        Georgette Shell, MD [facility]  If 7PM-7AM, please contact night-coverage [email or web address] Password [email or web address] 05/08/2017, 1:36 PM

## 2017-05-08 NOTE — Progress Notes (Addendum)
Progress Note   Subjective  No acute overnight events Pt denies pain today now He feels hiccups are better Per nursing no vomiting overnight He received IV iron    Objective  Vital signs in last 24 hours: Temp:  [98 F (36.7 C)-98.8 F (37.1 C)] 98 F (36.7 C) (11/11 0551) Pulse Rate:  [100-117] 100 (11/11 0551) Resp:  [18-20] 18 (11/11 0551) BP: (97-134)/(61-82) 121/82 (11/11 0551) SpO2:  [98 %-100 %] 98 % (11/11 0551) Weight:  [59.1 kg (130 lb 4.7 oz)] 59.1 kg (130 lb 4.7 oz) (11/11 0551) Last BM Date: 05/06/17  General: Alert, well-developed, in NAD Heart:  borderline tachycardic, regular  Chest: Clear to ascultation bilaterally Abdomen:  Soft, nontender and nondistended. Normal bowel sounds, without guarding, and without rebound.   Extremities:  Without edema. Neurologic:  Alert and  oriented x4; grossly normal neurologically. Psych:  Alert and cooperative. Normal mood and affect.  Intake/Output from previous day: 11/10 0701 - 11/11 0700 In: 1077 [P.O.:960; IV Piggyback:117] Out: 200 [Urine:200] Intake/Output this shift: Total I/O In: 360 [P.O.:360] Out: -   Lab Results: Recent Labs    05/06/17 1656 05/06/17 2257 05/07/17 0605  WBC 14.7*  --  8.0  HGB 10.5* 9.9* 9.5*  HCT 35.4* 33.1* 32.1*  PLT 360  --  283   BMET Recent Labs    05/06/17 1656 05/07/17 0605  NA 134* 139  K 5.2* 4.2  CL 99* 106  CO2 27 27  GLUCOSE 146* 104*  BUN 18 16  CREATININE 1.41* 1.40*  CALCIUM 8.9 8.5*   LFT Recent Labs    05/06/17 1656  PROT 6.7  ALBUMIN 3.4*  AST 19  ALT 12*  ALKPHOS 114  BILITOT 0.4    Ct Chest Wo Contrast  Result Date: 05/06/2017 CLINICAL DATA:  Evaluate for possible esophageal injury. EXAM: CT CHEST WITHOUT CONTRAST TECHNIQUE: Multidetector CT imaging of the chest was performed following the standard protocol without IV contrast. COMPARISON:  CT abdomen, same date. FINDINGS: Cardiovascular: The heart is within normal limits in size for  age. No pericardial effusion. There is tortuosity, ectasia and calcification of the thoracic aorta. Three-vessel coronary artery calcifications are noted. Mediastinum/Nodes: No mediastinal lymphadenopathy. The distal esophagus demonstrates extensive wall thickening but no esophageal leak/tear/rupture is identified. No obvious deep esophageal ulcer is demonstrated. Lungs/Pleura: Patchy areas of atelectasis and basilar scarring changes but no infiltrates, pneumothorax or pneumomediastinum. Upper Abdomen: No significant upper abdominal findings. The stomach is grossly. Musculoskeletal: No significant bony findings. IMPRESSION: 1. No CT findings for esophageal leak/tear/rupture. 2. Markedly thickened distal esophageal wall suggesting severe esophagitis without obvious ulcer. 3. No significant pulmonary findings. Patchy areas of atelectasis and mild basilar scarring. Aortic Atherosclerosis (ICD10-I70.0). Electronically Signed   By: Marijo Sanes M.D.   On: 05/06/2017 20:38   Ct Abdomen Pelvis W Contrast  Result Date: 05/06/2017 CLINICAL DATA:  Abdominal pain hematemesis since 7 o'clock this morning. EXAM: CT ABDOMEN AND PELVIS WITH CONTRAST TECHNIQUE: Multidetector CT imaging of the abdomen and pelvis was performed using the standard protocol following bolus administration of intravenous contrast. CONTRAST:  < 100 cc ISOVUE-300 IOPAMIDOL (ISOVUE-300) INJECTION 61% COMPARISON:  CT scan 11/26/2016 FINDINGS: Lower chest: The lung bases are grossly clear. No pleural effusion. Minimal subpleural atelectasis. The heart is normal in size.  No pericardial effusion. Significant wall thickening and edema involving the distal esophagus. Patient has a history of prior bleeding esophageal ulcers on endoscopy from September 2018. There is also a small  hiatal hernia and there appears to be a small amount of fluid around the esophagus. Could not exclude a vomiting associated distal esophageal injury. Hepatobiliary: No focal hepatic  lesions or intrahepatic biliary dilatation. A few tiny low-attenuation lesions are likely cysts. Gallbladder appears normal. No common bile duct dilatation. Pancreas: No mass, inflammation or duct dilatation. Spleen: Normal size.  No focal lesions. Adrenals/Urinary Tract: The adrenal glands are normal. Small renal cysts. No worrisome renal lesions, renal calculi or hydronephrosis. No obstructing ureteral calculi or bladder calculi. Stomach/Bowel: The stomach is grossly normal. No obvious ulcer. The duodenum, small bowel and colon are grossly normal without oral contrast. Scattered colonic diverticulosis. The terminal ileum is normal. The appendix is normal. Vascular/Lymphatic: Scattered atherosclerotic calcifications involving the aorta but no aneurysm or dissection. The major venous structures are patent. No mesenteric or retroperitoneal mass or adenopathy. Reproductive: The prostate gland and seminal vesicles are unremarkable. Other: No pelvic mass or adenopathy. No free pelvic fluid collections. No inguinal mass or adenopathy. No abdominal wall hernia or subcutaneous lesions. Musculoskeletal: No significant bony findings. A right hip prosthesis is noted with significant artifact through the lower pelvis. Remote compression deformity of L1 is noted. IMPRESSION: Markedly thickened distal esophagus with some surrounding fluid. Could not exclude a vomiting related esophageal injury. A chest CT with ingestion of small amount of water-soluble oral contrast may be helpful to exclude an esophageal leak. Patient also had esophageal ulcers on endoscopy from September. No acute abdominal/ pelvic findings, mass lesions or adenopathy. Electronically Signed   By: Marijo Sanes M.D.   On: 05/06/2017 18:52      Assessment & Plan  82 year old with severe esophagitis, hiccups and epigastric pain, known large hiatal hernia  1.  Esophagitis --continue maximal medical therapy for known esophagitis.  Worsened by known large  hiatal hernia. --Twice daily PPI, IV today, transition twice daily oral tomorrow --Ranitidine 300 twice daily --We will continue baclofen 10 mg 3 times daily --Will keep Zofran in place for today, can back off tomorrow if improving and no nausea or vomiting --Keep head of bed elevated and encourage patient to remain upright during the day  2.  IDA --received Feraheme yesterday.  Will need to re-dose in 7 days  GI will be available, call with questions. We will arrange outpatient follow-up with our office     LOS: 1 day   Jerene Bears  05/08/2017, 10:31 AM

## 2017-05-09 LAB — CBC WITH DIFFERENTIAL/PLATELET
BASOS ABS: 0.1 10*3/uL (ref 0.0–0.1)
Basophils Relative: 1 %
EOS ABS: 0.4 10*3/uL (ref 0.0–0.7)
Eosinophils Relative: 5 %
HEMATOCRIT: 32.9 % — AB (ref 39.0–52.0)
HEMOGLOBIN: 10 g/dL — AB (ref 13.0–17.0)
LYMPHS ABS: 2.3 10*3/uL (ref 0.7–4.0)
LYMPHS PCT: 30 %
MCH: 22.8 pg — ABNORMAL LOW (ref 26.0–34.0)
MCHC: 30.4 g/dL (ref 30.0–36.0)
MCV: 74.9 fL — ABNORMAL LOW (ref 78.0–100.0)
Monocytes Absolute: 0.5 10*3/uL (ref 0.1–1.0)
Monocytes Relative: 6 %
NEUTROS ABS: 4.4 10*3/uL (ref 1.7–7.7)
Neutrophils Relative %: 58 %
Platelets: 267 10*3/uL (ref 150–400)
RBC: 4.39 MIL/uL (ref 4.22–5.81)
RDW: 18.1 % — AB (ref 11.5–15.5)
WBC: 7.7 10*3/uL (ref 4.0–10.5)

## 2017-05-09 LAB — BASIC METABOLIC PANEL
ANION GAP: 9 (ref 5–15)
BUN: 10 mg/dL (ref 6–20)
CALCIUM: 8.5 mg/dL — AB (ref 8.9–10.3)
CO2: 22 mmol/L (ref 22–32)
Chloride: 105 mmol/L (ref 101–111)
Creatinine, Ser: 1.38 mg/dL — ABNORMAL HIGH (ref 0.61–1.24)
GFR calc non Af Amer: 47 mL/min — ABNORMAL LOW (ref >60)
GFR, EST AFRICAN AMERICAN: 55 mL/min — AB (ref >60)
Glucose, Bld: 107 mg/dL — ABNORMAL HIGH (ref 65–99)
Potassium: 4.4 mmol/L (ref 3.5–5.1)
SODIUM: 136 mmol/L (ref 135–145)

## 2017-05-09 MED ORDER — PANTOPRAZOLE SODIUM 40 MG PO TBEC
40.0000 mg | DELAYED_RELEASE_TABLET | Freq: Two times a day (BID) | ORAL | Status: DC
  Administered 2017-05-09 – 2017-05-10 (×2): 40 mg via ORAL
  Filled 2017-05-09 (×2): qty 1

## 2017-05-09 NOTE — Clinical Social Work Note (Signed)
CSW acknowledges consult, "FAMILY CANNOT AFFORD [place].ANY HELP APPRECIATED." Will notify RNCM in morning progression meeting.  CSW signing off. Consult again if any social work needs arise.  Dayton Scrape, [place]

## 2017-05-09 NOTE — Progress Notes (Signed)
PROGRESS NOTE    Logan [name]  YFV:[id number] DOB: 12/02/1934 DOA: 05/06/2017 PCP: No primary care provider on file.  Brief Narrative: 82 y.o.malewith medical history significant ofiron deficiency anemia, chronic hiccups, diastolic CHF last EF 16-38% with grade 1dFx, GERD with esophagitis, CVA,andCKD stage III;who presents after having 4 episodes ofcoffee ground emesis today.Family helps translate. He also complains of epigastric pain which started after he started vomiting. Denies being on blood thinners or utilizing any NSAIDs or alcohol. Associated symptoms include chronic hiccups despite medications of Thorazine and baclofen. He is followed by Dr. Arelia Longest of gastroenterology in the outpatient setting. They note that this is hisfourth admission in the last 2 months for the same. Lastadmitted to the hospital from 10/14-10/17,and his lastEGDwason 9/7/18showedlinear, esophageal ulcers,antral erosions, andpathology confirmed ulcerative esophagitis. Patient has been evaluated with MRIs of the brain which showed no acute abnormalities.    ED Course:Upon admission into the emergency department patient was seen to have a temperature of 99.3 F, pulse 115-123, respirations 15-18, and all other vital signs maintained. Labs revealed WBC 14.7, hemoglobin 10.5, potassium 5.2, BUN 18, andcreatinine 1.41.Stool guaiac was negative. CT scans of the chest and abdomen revealedthickened esophagus suggestive of severe esophagitis with no clear visible ulcer. While in the emergency department he received 2 L of IV fluids, morphine, 40 mg of Protonix IV, and Phenergan. Dr.Pyrtleof gastroenterology was consulted and will see the patient in a.m. [facility] called to admit. Patient reports having no further hematemesis.  Labs are pending at this time.  Patient still has not had a another episode of hematemesis or melena.    Assessment & Plan:   Principal Problem:   Hematemesis Active  Problems:   Intractable hiccups   Gastroesophageal reflux disease with esophagitis   Acute upper GI bleed   Hyperkalemia   Abnormal CT scan, esophagus  Ulcerative esophagitis with hiatal hernia/chronic hiccups- patient has not had any further hematemesis.  H&H remained stable.  Noted GI evaluation continue IV Protonix for now change to p.o. tomorrow if patient continues to be stable.  Continue ranitidine 300 mg twice a day and baclofen and Zofran.  Patient wants to lie down during the day and night he refuses to sit up and remain upright.  Patient received 1 IV Feraheme yesterday.  Discussed with patient's son.  He reports that they cannot afford to buy or Medicare does not cover Protonix.  Will get social worker consult.  Leukocytosis resolved no evidence of infection.    DVT prophylaxis: scd Code Status full :Family Communication dw son Disposition Plan: Hope to DC home tomorrow on p.o. Protonix if H&H stable.   Consultants: gi  Procedures:none Antimicrobials:none  Subjective:denies nausea,vomitting,diarrhea  Objective: Resting in bed in no acute distress answered all my questions appropriately. Vitals:   05/08/17 2030 05/09/17 0608 05/09/17 0934 05/09/17 1135  BP: (!) 143/82 (!) 133/104 118/71 121/62  Pulse: 99 98 97 68  Resp: 18 16 18 16   Temp: 98.4 F (36.9 C) 98.1 F (36.7 C) 97.9 F (36.6 C) 97.7 F (36.5 C)  TempSrc: Oral Oral Oral Oral  SpO2: 98% 98% 100% 100%  Weight: 59.5 kg (131 lb 1.6 oz)     Height:        Intake/Output Summary (Last 24 hours) at 05/09/2017 1506 Last data filed at 05/09/2017 1258 Gross per 24 hour  Intake 720 ml  Output 2225 ml  Net -1505 ml   Filed Weights   05/06/17 2353 05/08/17 0551 05/08/17 2030  Weight: 57.8 kg (  127 lb 8 oz) 59.1 kg (130 lb 4.7 oz) 59.5 kg (131 lb 1.6 oz)    Examination:  General exam: Appears calm and comfortable  Respiratory system: Clear to auscultation. Respiratory effort normal. Cardiovascular  system: S1 & S2 heard, RRR. No JVD, murmurs, rubs, gallops or clicks. No pedal edema. Gastrointestinal system: Abdomen is nondistended, soft and nontender. No organomegaly or masses felt. Normal bowel sounds heard. Central nervous system: Alert and oriented. No focal neurological deficits. Extremities: Symmetric 5 x 5 power. Skin: No rashes, lesions or ulcers Psychiatry: Judgement and insight appear normal. Mood & affect appropriate.     Data Reviewed: I have personally reviewed following labs and imaging studies  CBC: Recent Labs  Lab 05/06/17 1656 05/06/17 2257 05/07/17 0605  WBC 14.7*  --  8.0  NEUTROABS 12.7*  --   --   HGB 10.5* 9.9* 9.5*  HCT 35.4* 33.1* 32.1*  MCV 73.6*  --  73.5*  PLT 360  --  751   Basic Metabolic Panel: Recent Labs  Lab 05/06/17 1656 05/07/17 0605  NA 134* 139  K 5.2* 4.2  CL 99* 106  CO2 27 27  GLUCOSE 146* 104*  BUN 18 16  CREATININE 1.41* 1.40*  CALCIUM 8.9 8.5*   GFR: Estimated Creatinine Clearance: 36 mL/min (A) (by C-G formula based on SCr of 1.4 mg/dL (H)). Liver Function Tests: Recent Labs  Lab 05/06/17 1656  AST 19  ALT 12*  ALKPHOS 114  BILITOT 0.4  PROT 6.7  ALBUMIN 3.4*   Recent Labs  Lab 05/06/17 1656  LIPASE 24   No results for input(s): AMMONIA in the last 168 hours. Coagulation Profile: No results for input(s): INR, PROTIME in the last 168 hours. Cardiac Enzymes: No results for input(s): CKTOTAL, CKMB, CKMBINDEX, TROPONINI in the last 168 hours. BNP (last 3 results) No results for input(s): PROBNP in the last 8760 hours. HbA1C: No results for input(s): HGBA1C in the last 72 hours. CBG: No results for input(s): GLUCAP in the last 168 hours. Lipid Profile: No results for input(s): CHOL, HDL, LDLCALC, TRIG, CHOLHDL, LDLDIRECT in the last 72 hours. Thyroid Function Tests: No results for input(s): TSH, T4TOTAL, FREET4, T3FREE, THYROIDAB in the last 72 hours. Anemia Panel: No results for input(s): VITAMINB12,  FOLATE, FERRITIN, TIBC, IRON, RETICCTPCT in the last 72 hours. Sepsis Labs: No results for input(s): PROCALCITON, LATICACIDVEN in the last 168 hours.  No results found for this or any previous visit (from the past 240 hour(s)).       Radiology Studies: No results found.      Scheduled Meds: . ondansetron (ZOFRAN) IV  4 mg Intravenous Q8H   Or  . ondansetron  4 mg Oral Q8H  . pantoprazole  40 mg Oral BID  . ranitidine  300 mg Oral BID  . sucralfate  1 g Oral TID WC & HS   Continuous Infusions: . chlorproMAZINE (THORAZINE) IV Stopped (05/07/17 0530)     LOS: 2 days     Georgette Shell, MD [facility]  If 7PM-7AM, please contact night-coverage [email or web address] Password [email or web address] 05/09/2017, 3:06 PM

## 2017-05-09 NOTE — Progress Notes (Signed)
Received consult; patient cannot afford medication; patient has private insurance with Medicare and BCBS Medicaid with prescription drug coverage; CM cannot assist patient with medication due to patient has coverage; B Pennie Rushing [phone]

## 2017-05-09 NOTE — Progress Notes (Signed)
[facility]  Patient Status: Active (receiving services up to time of hospitalization)  [facility] is providing the following services: RN, PT, OT and HHA  If patient discharges after hours, please call [phone].   Logan [name] 05/09/2017, 11:30 AM

## 2017-05-10 MED ORDER — RANITIDINE HCL 150 MG/10ML PO SYRP: 300.0000 mg | ORAL_SOLUTION | Freq: Two times a day (BID) | ORAL | 0 refills | Status: DC

## 2017-05-10 MED ORDER — PANTOPRAZOLE SODIUM 40 MG PO TBEC: 40.0000 mg | DELAYED_RELEASE_TABLET | Freq: Two times a day (BID) | ORAL | 1 refills | Status: AC

## 2017-05-10 MED ORDER — BACLOFEN 5 MG PO TABS: 5.0000 mg | ORAL_TABLET | Freq: Three times a day (TID) | ORAL | 3 refills | Status: DC | PRN

## 2017-05-10 NOTE — Discharge Summary (Signed)
Physician Discharge Summary  Logan [name] SEG:[id number] DOB: 1935/04/15 DOA: 05/06/2017  PCP: No primary care provider on file.  Admit date: 05/06/2017 Discharge date: 05/10/2017  Admitted FromDisposition:  Recommendations for Outpatient Follow-up:  1. Follow up with PCP in 1-2 weeks 2. Please obtain BMP/CBC in one week 3. FOLLOW UP WITH GI IN 2 WEEKS  Home Health YES Equipment/Devices: NONE  Discharge Condition STABLE CODE STATUS FULL Diet recommendation:SOFT REGULAR Brief/Interim Summary:81 y.o.malewith medical history significant ofiron deficiency anemia, chronic hiccups, diastolic CHF last EF 73-71% with grade 1dFx, GERD with esophagitis, CVA,andCKD stage III;who presents after having 4 episodes ofcoffee ground emesis today.Family helps translate. He also complains of epigastric pain which started after he started vomiting. Denies being on blood thinners or utilizing any NSAIDs or alcohol. Associated symptoms include chronic hiccups despite medications of Thorazine and baclofen. He is followed by Dr. Arelia Longest of gastroenterology in the outpatient setting. They note that this is hisfourth admission in the last 2 months for the same. Lastadmitted to the hospital from 10/14-10/17,and his lastEGDwason 9/7/18showedlinear, esophageal ulcers,antral erosions, andpathology confirmed ulcerative esophagitis. Patient has been evaluated with MRIs of the brain which showed no acute abnormalities.    ED Course:Upon admission into the emergency department patient was seen to have a temperature of 99.3 F, pulse 115-123, respirations 15-18, and all other vital signs maintained. Labs revealed WBC 14.7, hemoglobin 10.5, potassium 5.2, BUN 18, andcreatinine 1.41.Stool guaiac was negative. CT scans of the chest and abdomen revealedthickened esophagus suggestive of severe esophagitis with no clear visible ulcer. While in the emergency department he received 2 L of IV fluids,  morphine, 40 mg of Protonix IV, and Phenergan. Dr.Pyrtleof gastroenterology was consulted and will see the patient in a.m. [facility] called to admit. Patient reports having no further hematemesis.  Labs are pending at this time.  Patient still has not had a another episode of hematemesis or melena.    Discharge Diagnoses:  Principal Problem:   Hematemesis Active Problems:   Intractable hiccups   Gastroesophageal reflux disease with esophagitis   Acute upper GI bleed   Hyperkalemia   Abnormal CT scan, esophagus Ulcerative esophagitis with large hiatal hernia and chronic hiccups-patient with known history of severe ulcerative esophagitis.  He had 4 admissions in the last 2 months for the same reason.  I have discussed with  his son that he must be on Protonix twice a day along with RANTAC twice a day and sucralfate.  In addition to that he is on baclofen and Thorazine for hiccups.  Needs to follow-up with GI in 2 weeks.  Patient's son reported that his Protonix is not covered with insurance I have put a case Occupational hygienist consult who have spoke with the patient's son.  And per their notes patient has private [employer] and [place] who does cover prescriptions.   Discharge Instructions   Allergies as of 05/10/2017   No Known Allergies     Medication List    STOP taking these medications   famotidine 40 MG/5ML suspension Commonly known as:  PEPCID   pantoprazole sodium 40 mg/20 mL Pack Commonly known as:  PROTONIX Replaced by:  pantoprazole 40 MG tablet     TAKE these medications   Baclofen 5 MG Tabs Take 5 mg 3 (three) times daily as needed by mouth. What changed:    when to take this  reasons to take this   chlorproMAZINE 25 MG tablet Commonly known as:  THORAZINE Take 1 tablet (25  mg total) by mouth 3 (three) times daily as needed.   feeding supplement (ENSURE ENLIVE) Liqd Take 237 mLs by mouth 2 (two) times daily between meals.   ondansetron 4 MG  disintegrating tablet Commonly known as:  ZOFRAN ODT Take 1 tablet (4 mg total) by mouth every 6 (six) hours as needed for nausea or vomiting.   pantoprazole 40 MG tablet Commonly known as:  PROTONIX Take 1 tablet (40 mg total) 2 (two) times daily by mouth. Replaces:  pantoprazole sodium 40 mg/20 mL Pack   ranitidine 150 MG/10ML syrup Commonly known as:  ZANTAC Take 20 mLs (300 mg total) 2 (two) times daily by mouth.   sucralfate 1 GM/10ML suspension Commonly known as:  CARAFATE Take 10 mLs (1 g total) by mouth 4 (four) times daily.       No Known Allergies  Consultations:  GI   Procedures/Studies: Dg Chest 2 View  Result Date: 04/10/2017 CLINICAL DATA:  Abdominal pain EXAM: CHEST  2 VIEW COMPARISON:  03/03/2017 FINDINGS: Normal cardiac silhouette with ectatic aorta. Fine interstitial lung disease not changed. Remote LEFT rib fractures. No pulmonary edema. No pneumothorax. No focal infiltrate. IMPRESSION: No acute cardiopulmonary process. Electronically Signed   By: Suzy Bouchard M.D.   On: 04/10/2017 21:12   Ct Chest Wo Contrast  Result Date: 05/06/2017 CLINICAL DATA:  Evaluate for possible esophageal injury. EXAM: CT CHEST WITHOUT CONTRAST TECHNIQUE: Multidetector CT imaging of the chest was performed following the standard protocol without IV contrast. COMPARISON:  CT abdomen, same date. FINDINGS: Cardiovascular: The heart is within normal limits in size for age. No pericardial effusion. There is tortuosity, ectasia and calcification of the thoracic aorta. Three-vessel coronary artery calcifications are noted. Mediastinum/Nodes: No mediastinal lymphadenopathy. The distal esophagus demonstrates extensive wall thickening but no esophageal leak/tear/rupture is identified. No obvious deep esophageal ulcer is demonstrated. Lungs/Pleura: Patchy areas of atelectasis and basilar scarring changes but no infiltrates, pneumothorax or pneumomediastinum. Upper Abdomen: No significant  upper abdominal findings. The stomach is grossly. Musculoskeletal: No significant bony findings. IMPRESSION: 1. No CT findings for esophageal leak/tear/rupture. 2. Markedly thickened distal esophageal wall suggesting severe esophagitis without obvious ulcer. 3. No significant pulmonary findings. Patchy areas of atelectasis and mild basilar scarring. Aortic Atherosclerosis (ICD10-I70.0). Electronically Signed   By: Marijo Sanes M.D.   On: 05/06/2017 20:38   Mr Jeri Cos OB Contrast  Result Date: 04/13/2017 CLINICAL DATA:  82 y/o  M; nausea and vomiting. EXAM: MRI HEAD WITHOUT AND WITH CONTRAST TECHNIQUE: Multiplanar, multiecho pulse sequences of the brain and surrounding structures were obtained without and with intravenous contrast. CONTRAST:  52mL MULTIHANCE GADOBENATE DIMEGLUMINE 529 MG/ML IV SOLN COMPARISON:  None. FINDINGS: Brain: No acute infarction, hemorrhage, hydrocephalus, extra-axial collection or mass lesion of the brain. Small T2 hyperintense foci are present within the caudate bodies and retaining compatible chronic lacunar infarctions. Diffuse patchy nonspecific foci of T2 FLAIR hyperintense signal abnormality in subcortical and periventricular white matter are compatible with advanced chronic microvascular ischemic changes for age. Moderate brain parenchymal volume loss. T2 hyperintense and T1 hypointense nonenhancing well-circumscribed round 10 mm structure in the right pituitary fossa compatible with Rathke's cleft cyst. Vascular: Normal flow voids. Skull and upper cervical spine: Normal marrow signal. Sinuses/Orbits: Negative. Other: 9 mm left parietal scalp dermal lesion, likely sebaceous cyst. 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 IMPRESSION: 1. No acute intracranial abnormality or abnormal enhancement of the brain. 2. 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 No further imaging  evaluation is necessary. This follows ACR consensus guidelines: Management of Incidental  Pituitary Findings on CT, MRI and F18-FDG PET: A White Paper of the ACR Incidental Findings Committee. J Am Coll Radiol 2018; 15: 966-72. 3. Advanced chronic microvascular ischemic changes and moderate parenchymal volume loss of the brain. 4. Right frontal scalp lipoma. 5. Left parietal scalp dermal lesion measuring 9 mm, likely sebaceous cyst, direct visualization recommended. Electronically Signed   By: Kristine Garbe M.D.   On: 04/13/2017 00:55   Ct Abdomen Pelvis W Contrast  Result Date: 05/06/2017 CLINICAL DATA:  Abdominal pain hematemesis since 7 o'clock this morning. EXAM: CT ABDOMEN AND PELVIS WITH CONTRAST TECHNIQUE: Multidetector CT imaging of the abdomen and pelvis was performed using the standard protocol following bolus administration of intravenous contrast. CONTRAST:  < 100 cc ISOVUE-300 IOPAMIDOL (ISOVUE-300) INJECTION 61% COMPARISON:  CT scan 11/26/2016 FINDINGS: Lower chest: The lung bases are grossly clear. No pleural effusion. Minimal subpleural atelectasis. The heart is normal in size.  No pericardial effusion. Significant wall thickening and edema involving the distal esophagus. Patient has a history of prior bleeding esophageal ulcers on endoscopy from September 2018. There is also a small hiatal hernia and there appears to be a small amount of fluid around the esophagus. Could not exclude a vomiting associated distal esophageal injury. Hepatobiliary: No focal hepatic lesions or intrahepatic biliary dilatation. A few tiny low-attenuation lesions are likely cysts. Gallbladder appears normal. No common bile duct dilatation. Pancreas: No mass, inflammation or duct dilatation. Spleen: Normal size.  No focal lesions. Adrenals/Urinary Tract: The adrenal glands are normal. Small renal cysts. No worrisome renal lesions, renal calculi or hydronephrosis. No obstructing ureteral calculi or bladder calculi. Stomach/Bowel: The stomach is grossly normal. No obvious ulcer. The duodenum, small  bowel and colon are grossly normal without oral contrast. Scattered colonic diverticulosis. The terminal ileum is normal. The appendix is normal. Vascular/Lymphatic: Scattered atherosclerotic calcifications involving the aorta but no aneurysm or dissection. The major venous structures are patent. No mesenteric or retroperitoneal mass or adenopathy. Reproductive: The prostate gland and seminal vesicles are unremarkable. Other: No pelvic mass or adenopathy. No free pelvic fluid collections. No inguinal mass or adenopathy. No abdominal wall hernia or subcutaneous lesions. Musculoskeletal: No significant bony findings. A right hip prosthesis is noted with significant artifact through the lower pelvis. Remote compression deformity of L1 is noted. IMPRESSION: Markedly thickened distal esophagus with some surrounding fluid. Could not exclude a vomiting related esophageal injury. A chest CT with ingestion of small amount of water-soluble oral contrast may be helpful to exclude an esophageal leak. Patient also had esophageal ulcers on endoscopy from September. No acute abdominal/ pelvic findings, mass lesions or adenopathy. Electronically Signed   By: Marijo Sanes M.D.   On: 05/06/2017 18:52    (Echo, Carotid, EGD, Colonoscopy, ERCP)    Subjective:   Discharge Exam: Vitals:   05/09/17 2019 05/10/17 0546  BP: 135/75 127/84  Pulse: 90 77  Resp: 18 18  Temp: 98.1 F (36.7 C) 98.2 F (36.8 C)  SpO2: 97% 94%   Vitals:   05/09/17 0934 05/09/17 1135 05/09/17 2019 05/10/17 0546  BP: 118/71 121/62 135/75 127/84  Pulse: 97 68 90 77  Resp: 18 16 18 18   Temp: 97.9 F (36.6 C) 97.7 F (36.5 C) 98.1 F (36.7 C) 98.2 F (36.8 C)  TempSrc: Oral Oral Oral Oral  SpO2: 100% 100% 97% 94%  Weight:    59.2 kg (130 lb 8 oz)  Height:  General: Pt is alert, awake, not in acute distress Cardiovascular: RRR, S1/S2 +, no rubs, no gallops Respiratory: CTA bilaterally, no wheezing, no rhonchi Abdominal: Soft,  NT, ND, bowel sounds + Extremities: no edema, no cyanosis    The results of significant diagnostics from this hospitalization (including imaging, microbiology, ancillary and laboratory) are listed below for reference.     Microbiology: No results found for this or any previous visit (from the past 240 hour(s)).   Labs: BNP (last 3 results) No results for input(s): BNP in the last 8760 hours. Basic Metabolic Panel: Recent Labs  Lab 05/06/17 1656 05/07/17 0605 05/09/17 1522  NA 134* 139 136  K 5.2* 4.2 4.4  CL 99* 106 105  CO2 27 27 22   GLUCOSE 146* 104* 107*  BUN 18 16 10   CREATININE 1.41* 1.40* 1.38*  CALCIUM 8.9 8.5* 8.5*   Liver Function Tests: Recent Labs  Lab 05/06/17 1656  AST 19  ALT 12*  ALKPHOS 114  BILITOT 0.4  PROT 6.7  ALBUMIN 3.4*   Recent Labs  Lab 05/06/17 1656  LIPASE 24   No results for input(s): AMMONIA in the last 168 hours. CBC: Recent Labs  Lab 05/06/17 1656 05/06/17 2257 05/07/17 0605 05/09/17 1522  WBC 14.7*  --  8.0 7.7  NEUTROABS 12.7*  --   --  4.4  HGB 10.5* 9.9* 9.5* 10.0*  HCT 35.4* 33.1* 32.1* 32.9*  MCV 73.6*  --  73.5* 74.9*  PLT 360  --  283 267   Cardiac Enzymes: No results for input(s): CKTOTAL, CKMB, CKMBINDEX, TROPONINI in the last 168 hours. BNP: Invalid input(s): POCBNP CBG: No results for input(s): GLUCAP in the last 168 hours. D-Dimer No results for input(s): DDIMER in the last 72 hours. Hgb A1c No results for input(s): HGBA1C in the last 72 hours. Lipid Profile No results for input(s): CHOL, HDL, LDLCALC, TRIG, CHOLHDL, LDLDIRECT in the last 72 hours. Thyroid function studies No results for input(s): TSH, T4TOTAL, T3FREE, THYROIDAB in the last 72 hours.  Invalid input(s): FREET3 Anemia work up No results for input(s): VITAMINB12, FOLATE, FERRITIN, TIBC, IRON, RETICCTPCT in the last 72 hours. Urinalysis    Component Value Date/Time   COLORURINE COLORLESS (A) 03/04/2017 0017   APPEARANCEUR CLEAR  03/04/2017 0017   LABSPEC 1.006 03/04/2017 0017   PHURINE 7.0 03/04/2017 0017   GLUCOSEU NEGATIVE 03/04/2017 0017   HGBUR NEGATIVE 03/04/2017 0017   BILIRUBINUR NEGATIVE 03/04/2017 0017   KETONESUR NEGATIVE 03/04/2017 0017   PROTEINUR NEGATIVE 03/04/2017 0017   NITRITE NEGATIVE 03/04/2017 0017   LEUKOCYTESUR NEGATIVE 03/04/2017 0017   Sepsis Labs Invalid input(s): PROCALCITONIN,  WBC,  LACTICIDVEN Microbiology No results found for this or any previous visit (from the past 240 hour(s)).   Time coordinating discharge: Over 30 minutes  SIGNED:   Georgette Shell, MD  [facility] 05/10/2017, 9:24 AM Pager   If 7PM-7AM, please contact night-coverage [email or web address] Password [email or web address]

## 2017-05-10 NOTE — Progress Notes (Addendum)
Pt slept well overnight, no any specific complain of chest pain and SOB, vitals stable, pt was able to walk to the bathroom with the help of walker, was incontinent versus continent overnight, no any sign of hematemesis, will continue to monitor the patient.

## 2017-05-10 NOTE — Progress Notes (Signed)
Reviewed discharge AVS with patient, his son and wife. Answered their questions. Patient is stable and ready for discharge.

## 2017-05-24 ENCOUNTER — Ambulatory Visit: Payer: Medicare Other | Admitting: Internal Medicine

## 2018-10-19 IMAGING — DX DG CHEST 1V PORT
1 series · 1 of 1 positions shown · non-contrast
Comparison: Remote radiographs 07/07/2006

CLINICAL DATA: Hematemesis.  Weakness.

EXAM:
PORTABLE CHEST 1 VIEW

[chest]
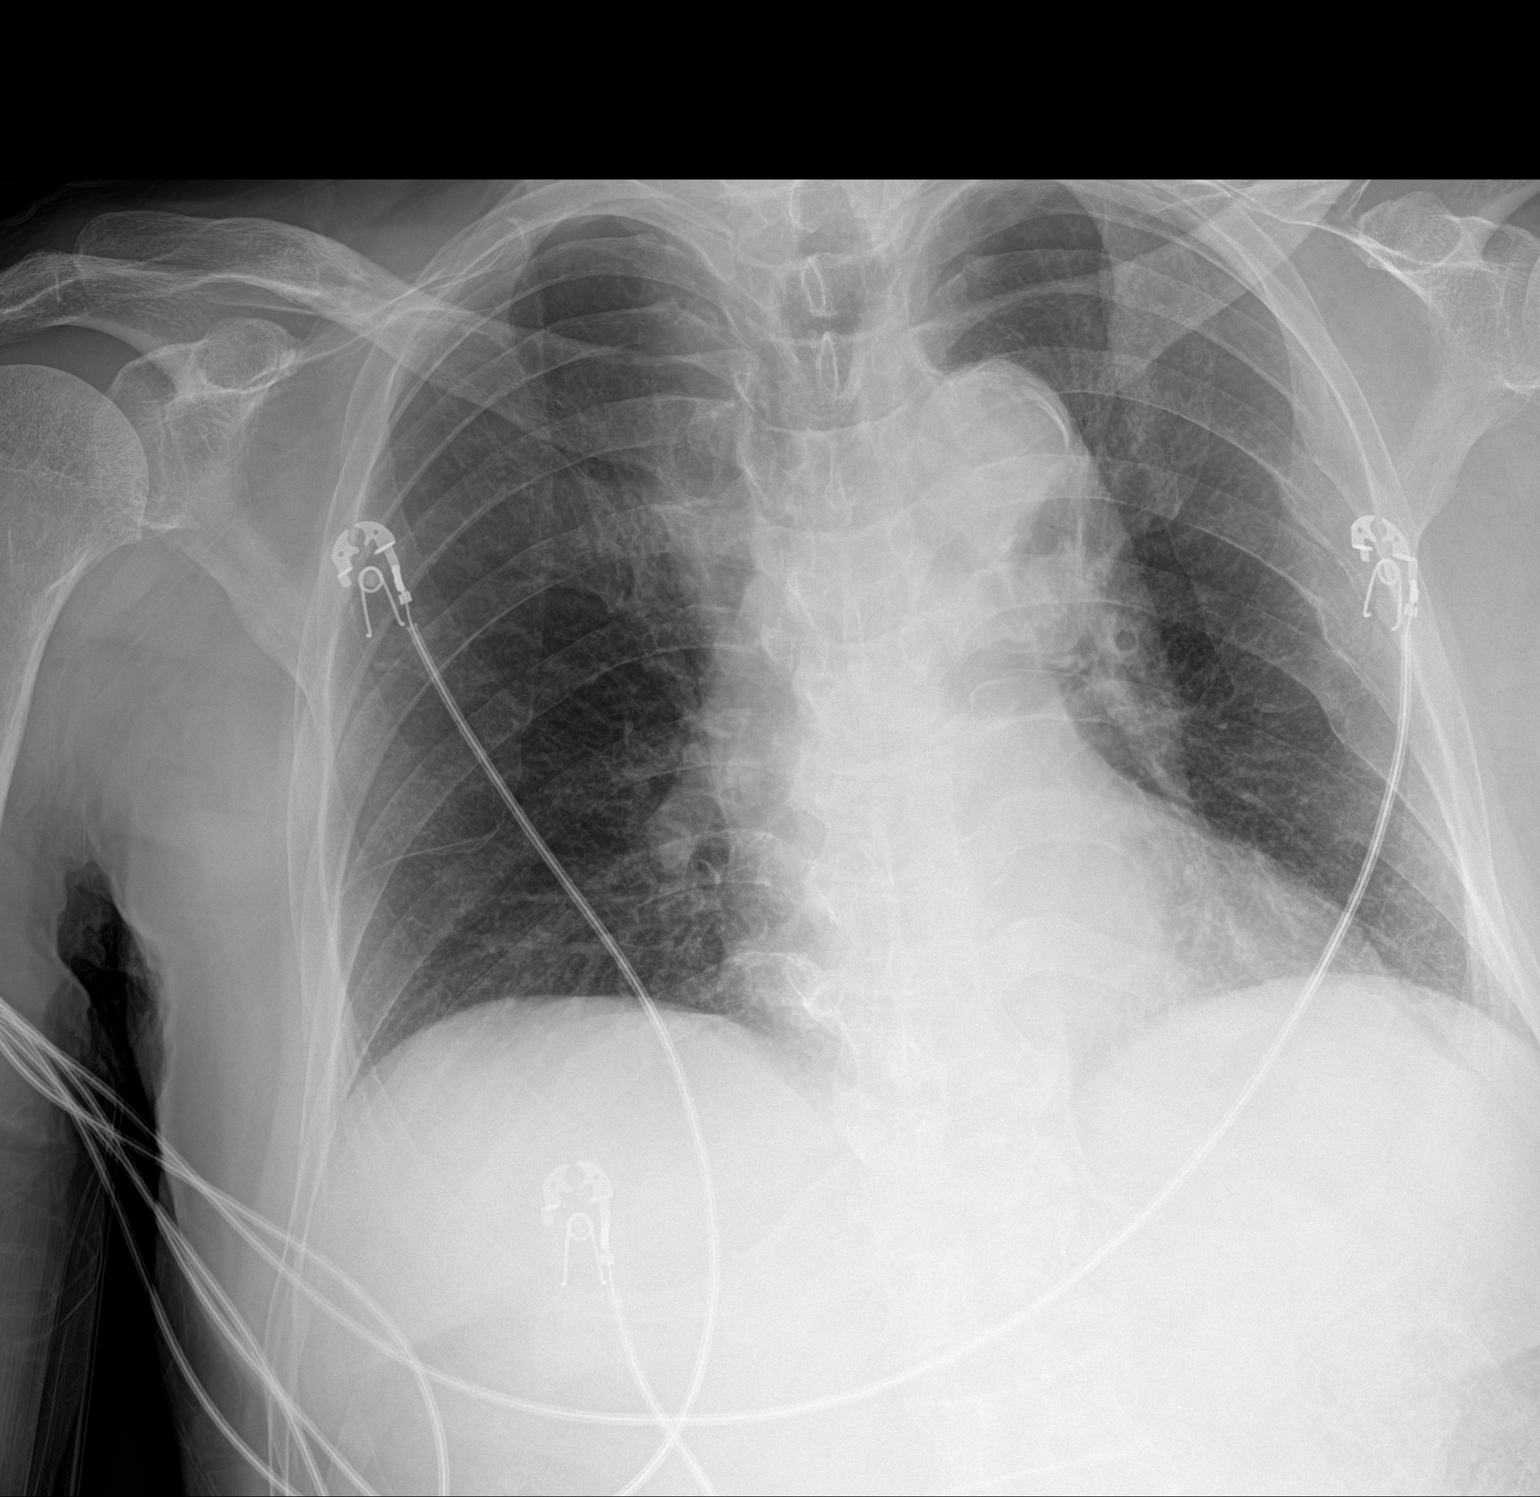

[1 of 1 positions shown; findings below may reference images not displayed]

FINDINGS: Heart size upper normal. Atherosclerosis and tortuosity of the
thoracic aorta. No pulmonary edema. No consolidation, pleural fluid
or pneumothorax. Remote left rib fractures.
IMPRESSION: 1. No acute chest finding.
2. Tortuous atherosclerotic thoracic aorta.

## 2018-10-24 IMAGING — US US RENAL
1 series · 14 of 25 positions shown · non-contrast
Comparison: CT 11/26/2016

CLINICAL DATA: Acute kidney injury.

EXAM:
RENAL / URINARY TRACT ULTRASOUND COMPLETE

[Series 1: us renal · 0.25mm/px · 14 of 25 slices shown]
[im 1/25]
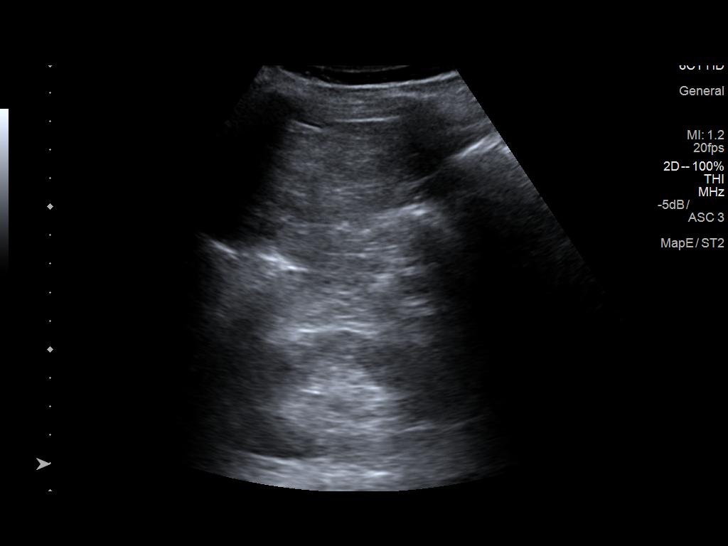
[im 3/25]
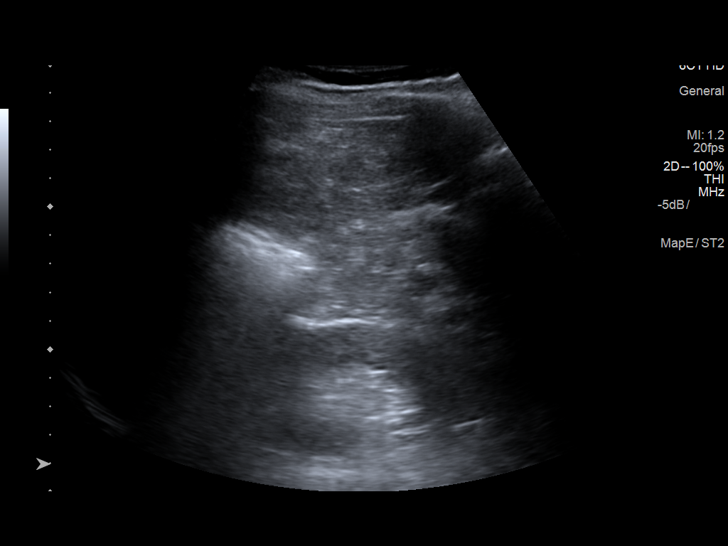
[im 5/25]
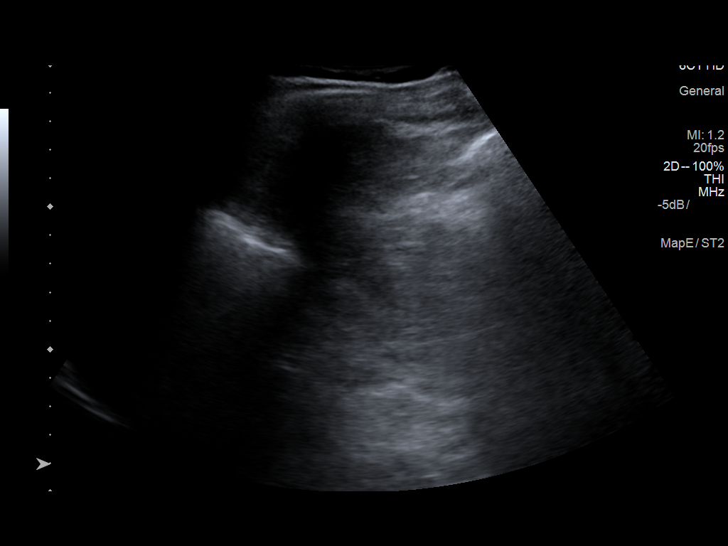
[im 7/25]
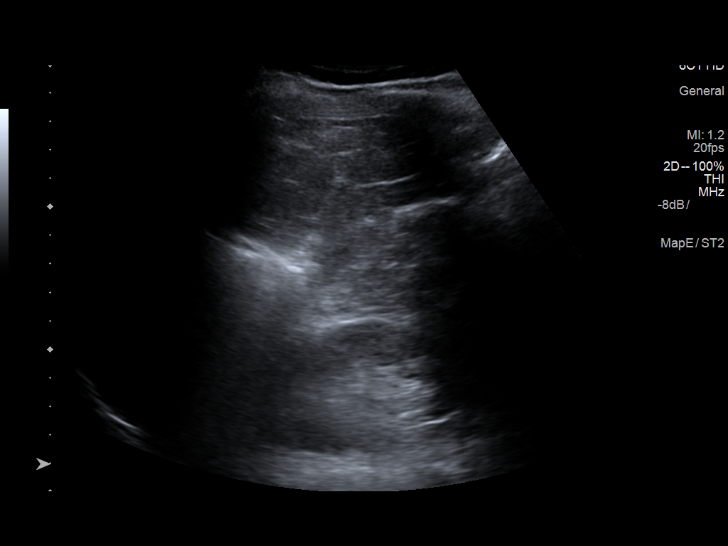
[im 9/25]
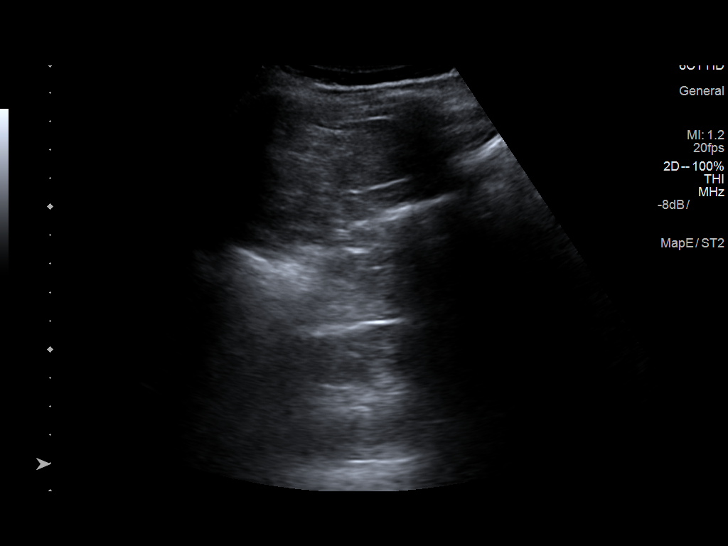
[im 10/25]
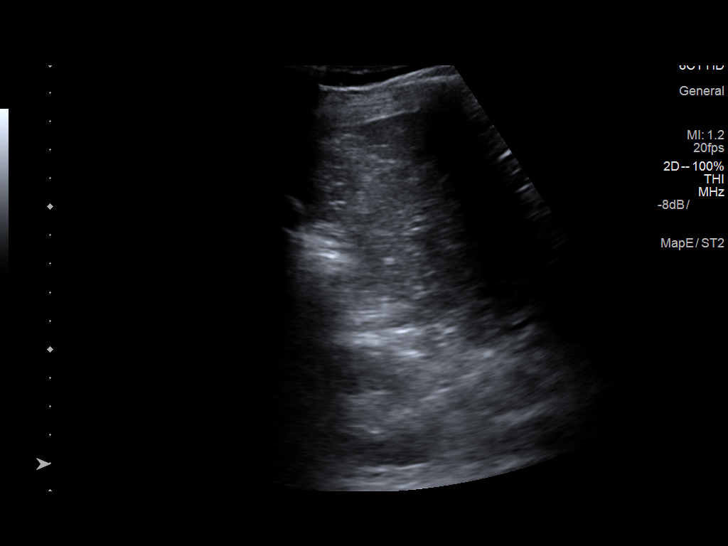
[im 12/25]
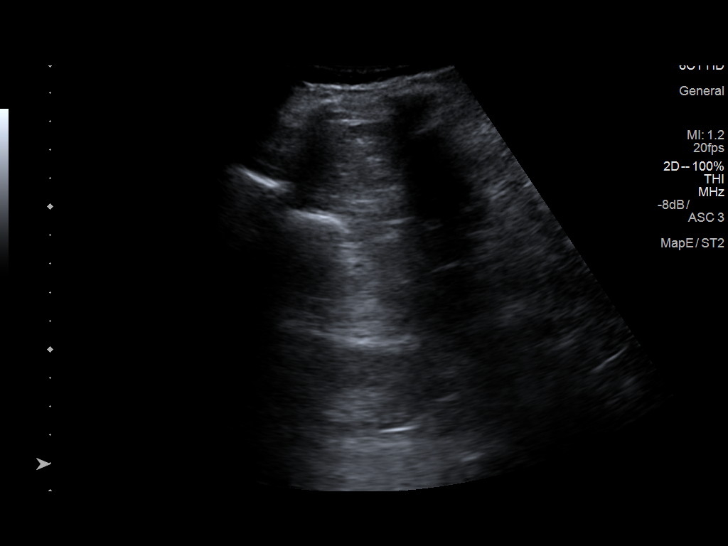
[im 14/25]
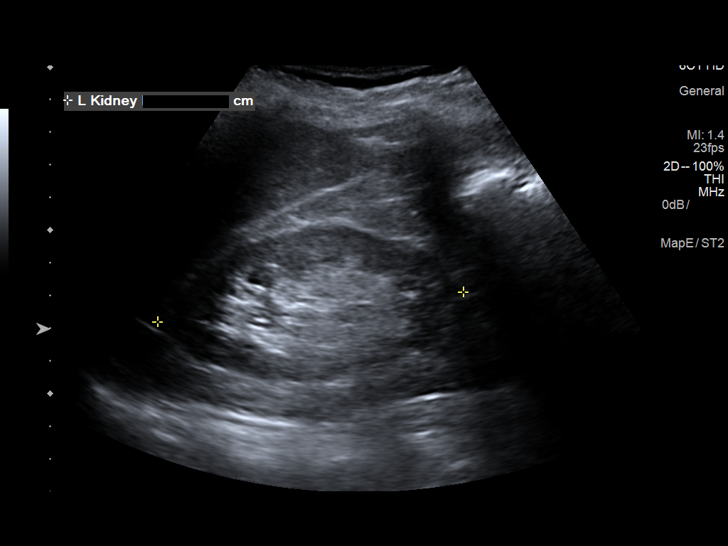
[im 16/25]
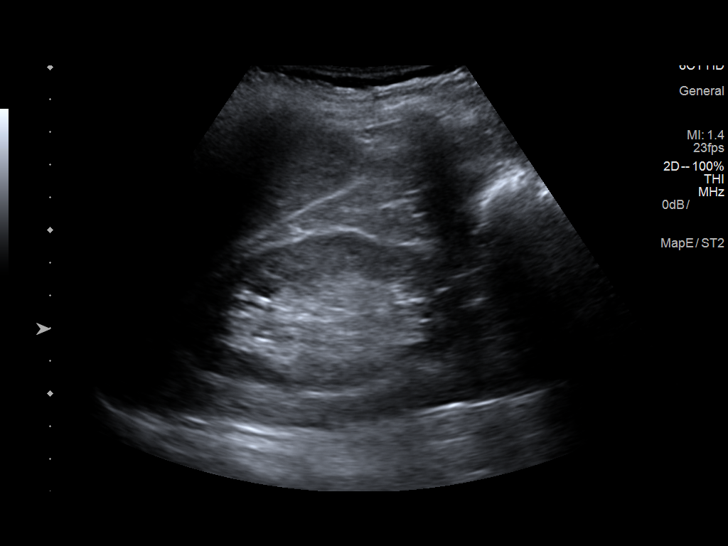
[im 17/25]
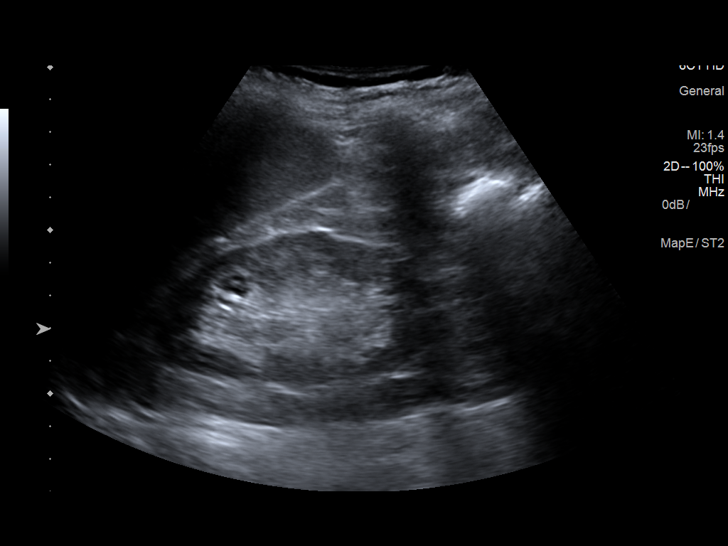
[im 19/25]
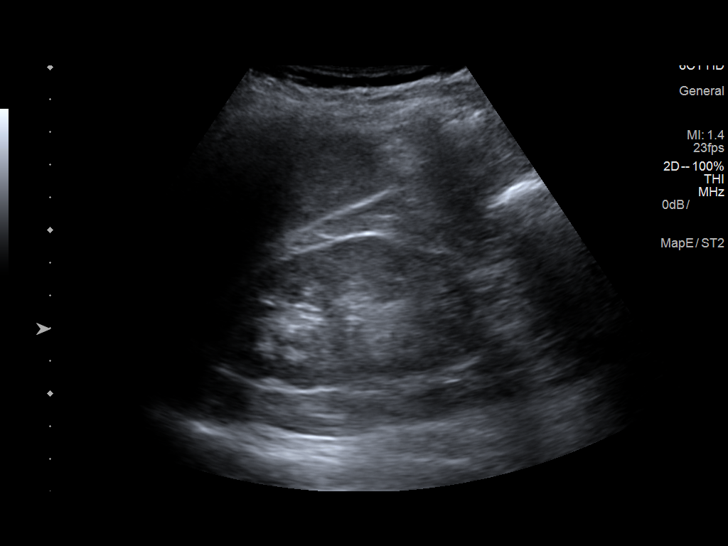
[im 21/25]
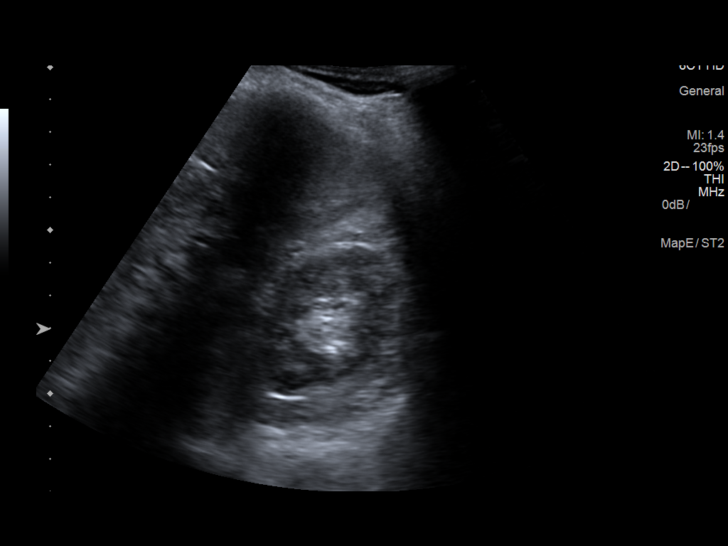
[im 23/25]
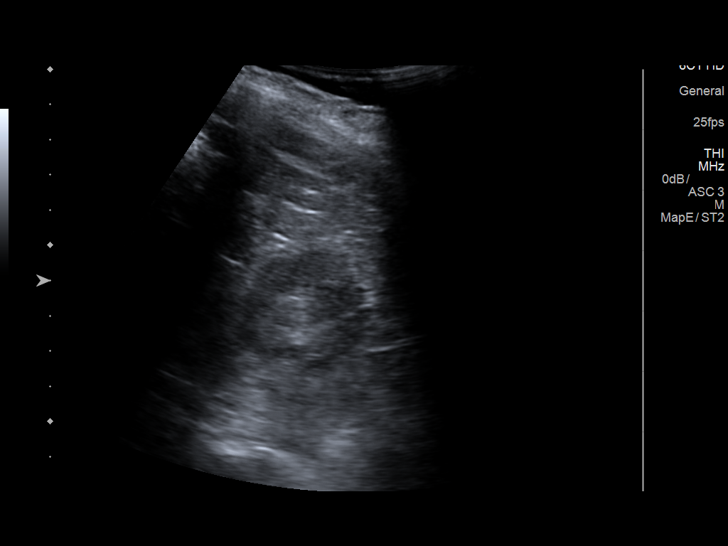
[im 25/25]
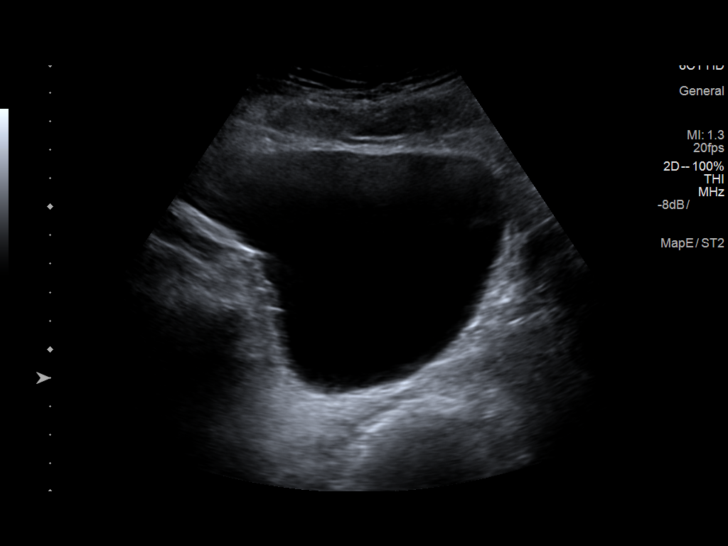

[14 of 25 positions shown; findings below may reference images not displayed]

FINDINGS: Right Kidney:

Length: 9.8 cm. Kidney is partially obscured by adjacent rib
shadowing. Echogenicity grossly within normal limits. No
hydronephrosis. No visualized mass.

Left Kidney:

Length: 9.4 cm. Echogenicity within normal limits. No
hydronephrosis. Small cyst on prior exam is not well seen
sonographically.

Bladder:

Appears normal for degree of bladder distention.
IMPRESSION: Unremarkable renal ultrasound.

## 2018-11-26 IMAGING — CR DG CHEST 2V
2 series · 2 of 2 positions shown · non-contrast
Comparison: 03/03/2017

CLINICAL DATA: Abdominal pain

EXAM:
CHEST  2 VIEW

[chest lat]
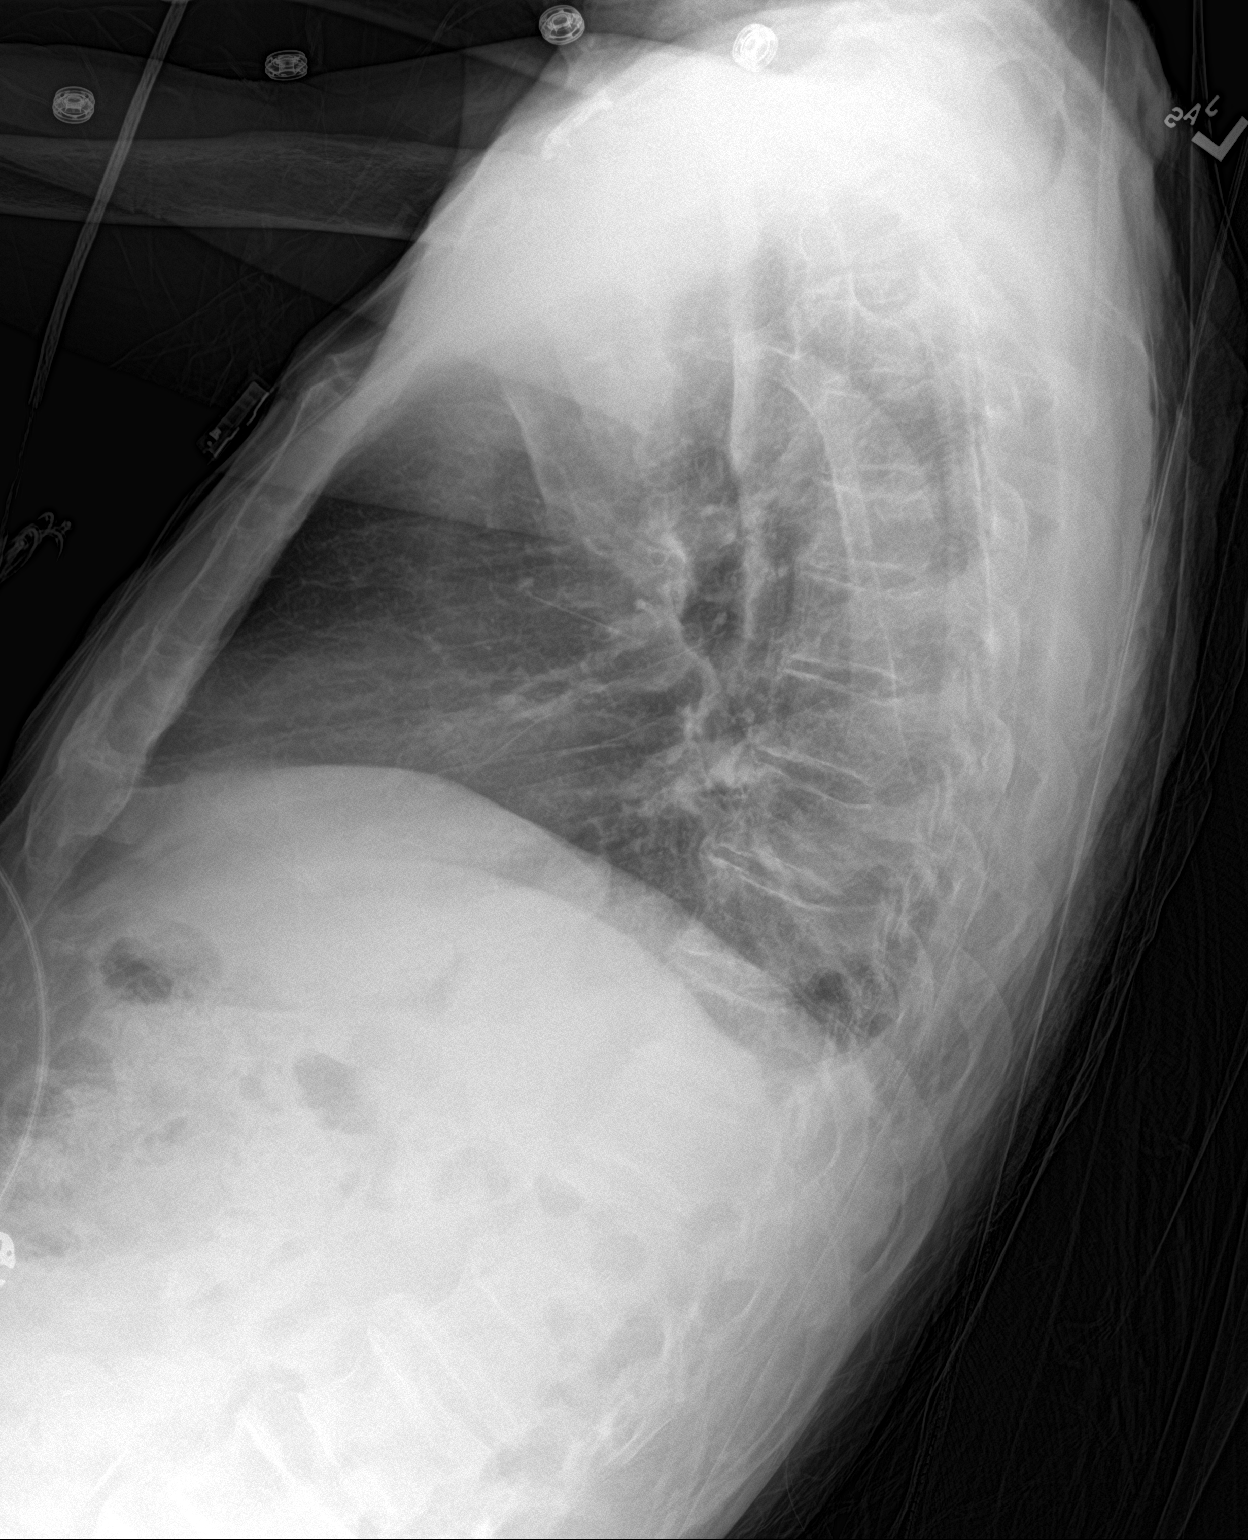

[chest ap]
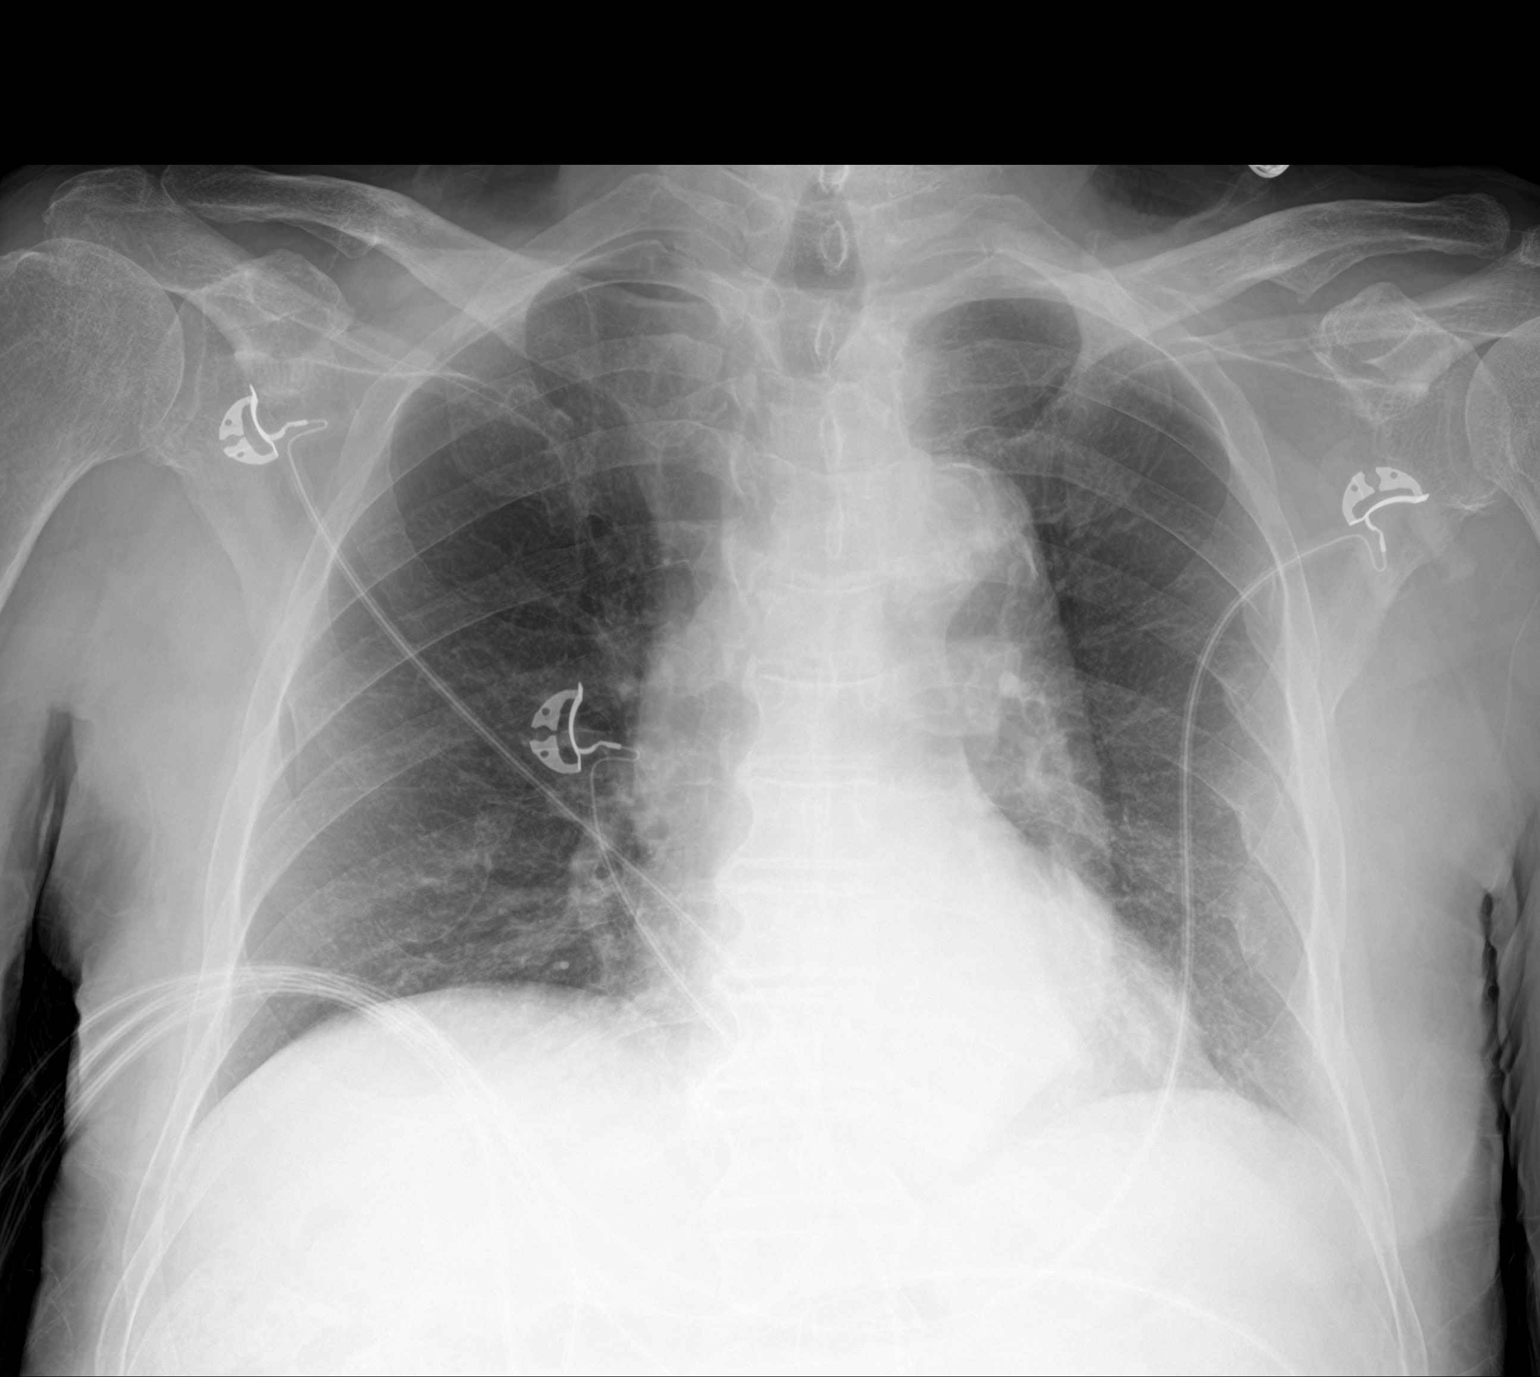

[2 of 2 positions shown; findings below may reference images not displayed]

FINDINGS: Normal cardiac silhouette with ectatic aorta. Fine interstitial lung
disease not changed. Remote LEFT rib fractures. No pulmonary edema.
No pneumothorax. No focal infiltrate.
IMPRESSION: No acute cardiopulmonary process.

## 2018-12-22 IMAGING — CT CT ABD-PELV W/ CM
2 of 5 series · 15 of 46 positions shown, 17 images · IV contrast (iopamidol)
Comparison: CT scan 11/26/2016

CLINICAL DATA: Abdominal pain hematemesis since 7 o'clock this
morning.

EXAM:
CT ABDOMEN AND PELVIS WITH CONTRAST
TECHNIQUE: Multidetector CT imaging of the abdomen and pelvis was performed
using the standard protocol following bolus administration of
intravenous contrast.
CONTRAST:  < 100 cc [id number]-F66 IOPAMIDOL (O6QXAM-F66) INJECTION 61%

[Series 5: abdomen 3.0 mpr cor · coronal · 0.61mm/px · 3 of 77 slices shown]
[im 26/77  soft-tissue]
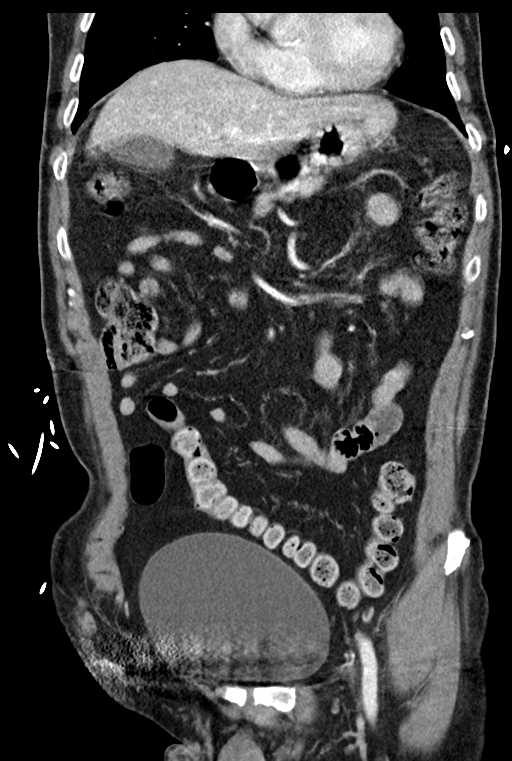
[im 34/77  soft-tissue]
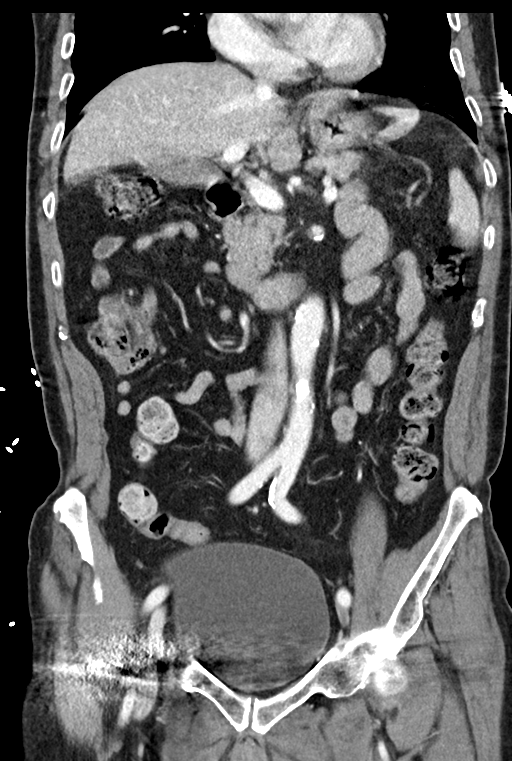
[im 43/77  soft-tissue]
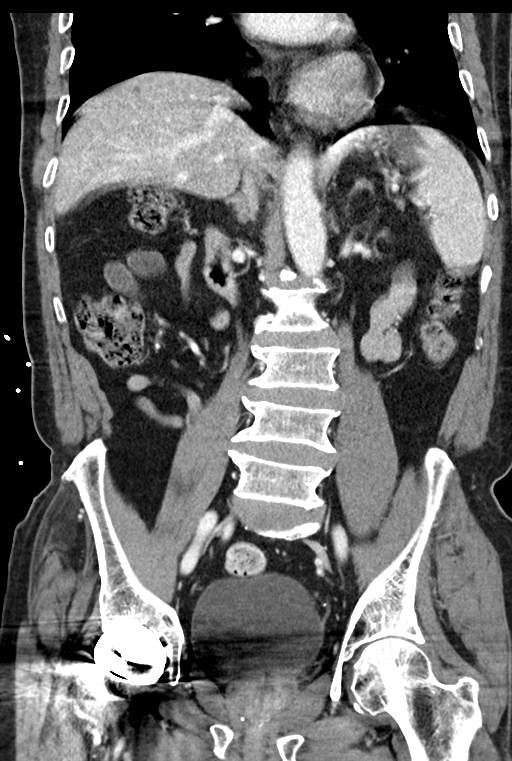

[Series 8: abdomen 5.0 · axial · 0.66mm/px · z∈[+777,+1177]mm · 12 of 94 slices shown, 14 images]
[im 7/94  soft-tissue]
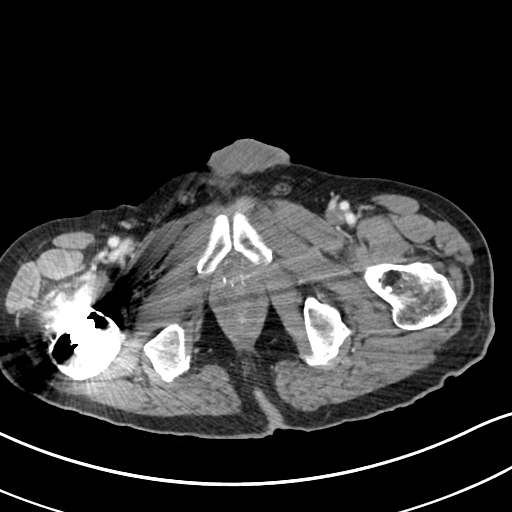
[im 7/94  bone]
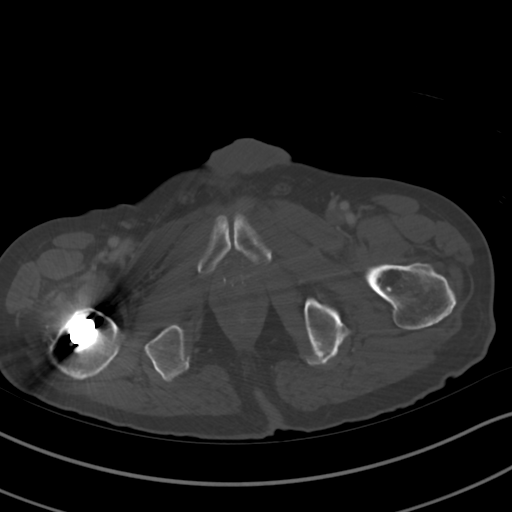
[im 14/94  soft-tissue]
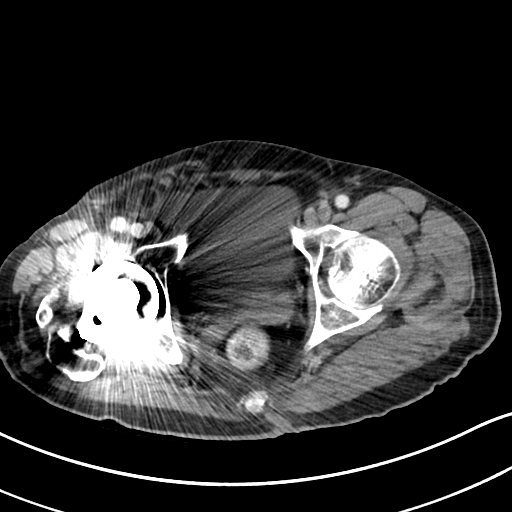
[im 20/94  soft-tissue]
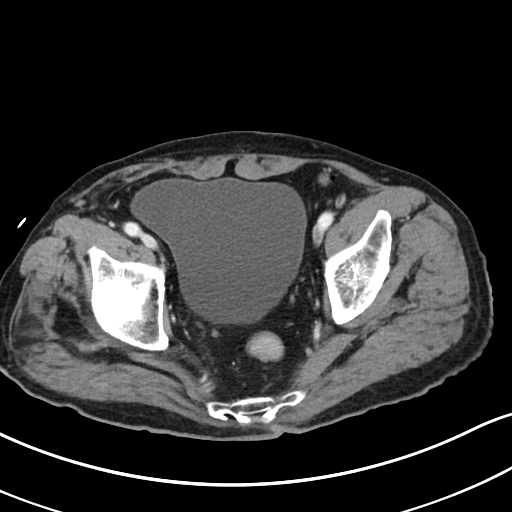
[im 27/94  soft-tissue]
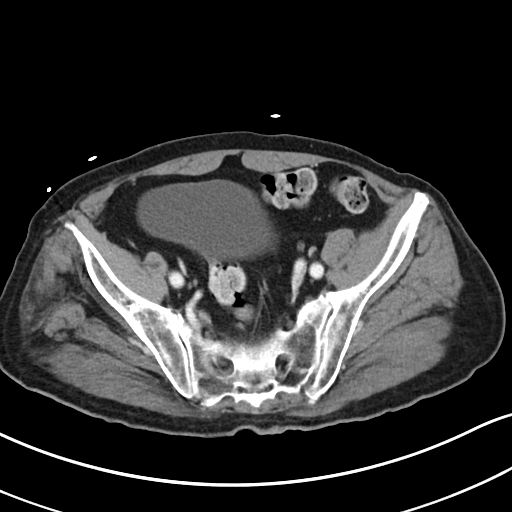
[im 34/94  soft-tissue]
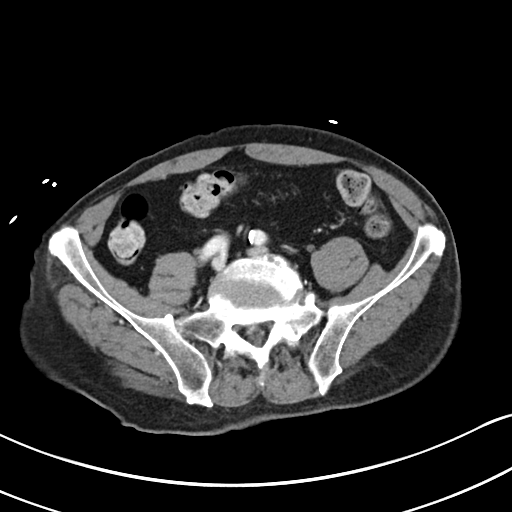
[im 40/94  soft-tissue]
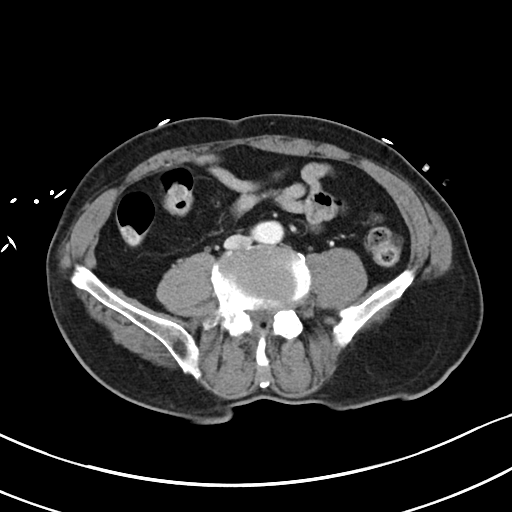
[im 54/94  soft-tissue]
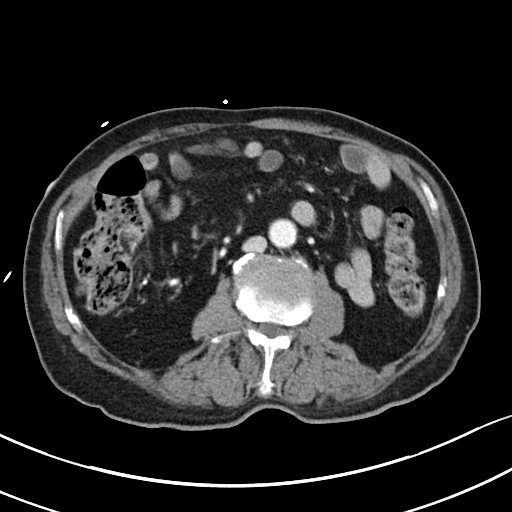
[im 60/94  soft-tissue]
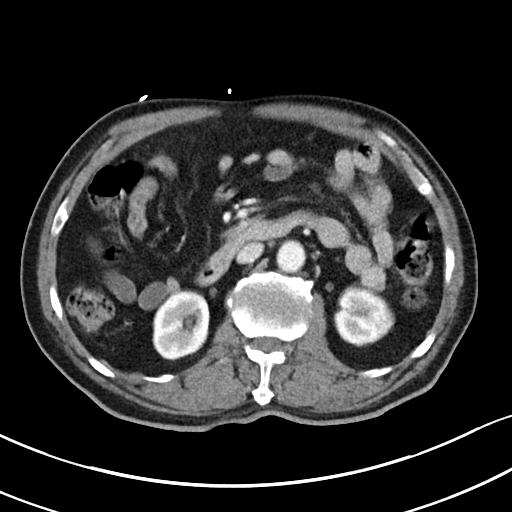
[im 67/94  soft-tissue]
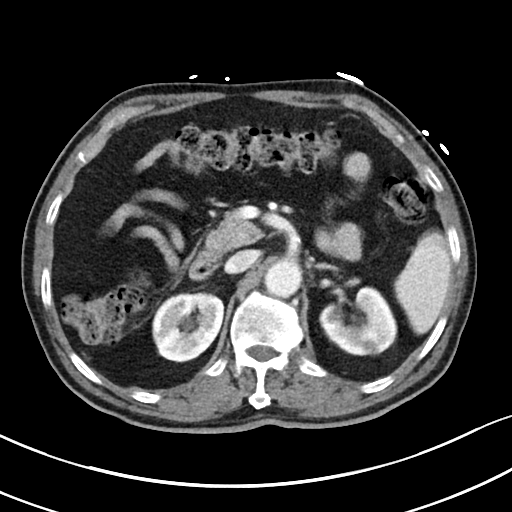
[im 67/94  bone]
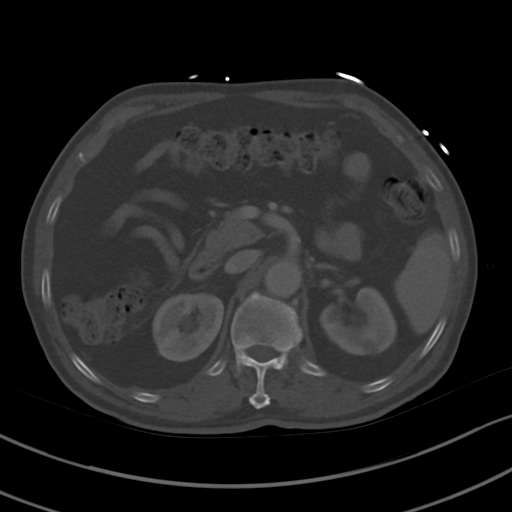
[im 74/94  soft-tissue]
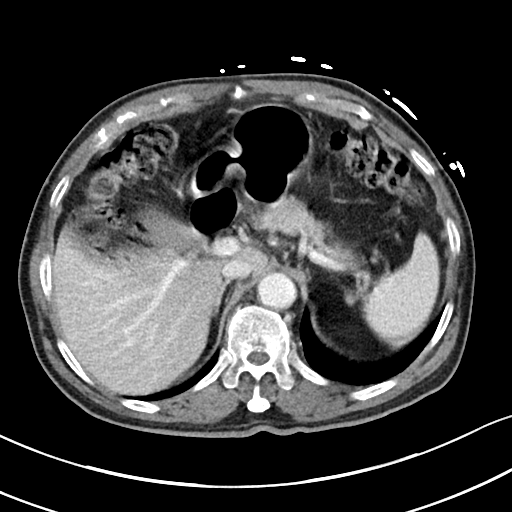
[im 80/94  soft-tissue]
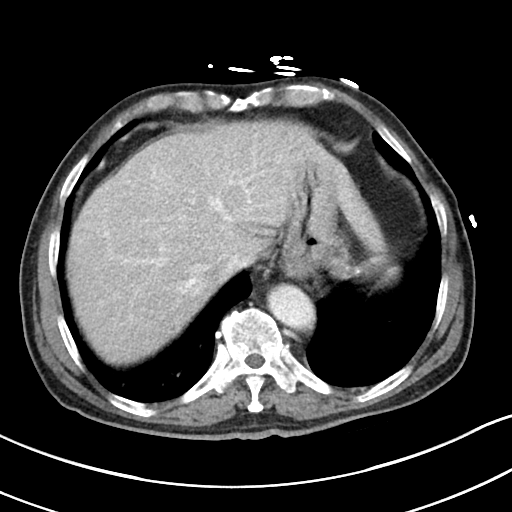
[im 87/94  soft-tissue]
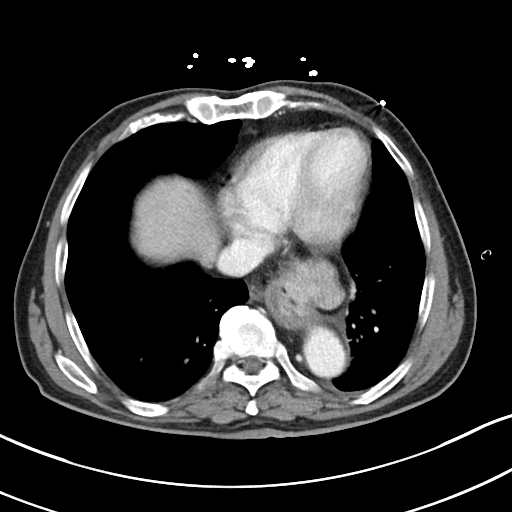

[15 of 46 positions shown; findings below may reference images not displayed]

FINDINGS: Lower chest: The lung bases are grossly clear. No pleural effusion.
Minimal subpleural atelectasis.

The heart is normal in size.  No pericardial effusion.

Significant wall thickening and edema involving the distal
esophagus. Patient has a history of prior bleeding esophageal ulcers
on endoscopy from February 2017. There is also a small hiatal
hernia and there appears to be a small amount of fluid around the
esophagus. Could not exclude a vomiting associated distal esophageal
injury.

Hepatobiliary: No focal hepatic lesions or intrahepatic biliary
dilatation. A few tiny low-attenuation lesions are likely cysts.
Gallbladder appears normal. No common bile duct dilatation.

Pancreas: No mass, inflammation or duct dilatation.

Spleen: Normal size.  No focal lesions.

Adrenals/Urinary Tract: The adrenal glands are normal.

Small renal cysts. No worrisome renal lesions, renal calculi or
hydronephrosis. No obstructing ureteral calculi or bladder calculi.

Stomach/Bowel: The stomach is grossly normal. No obvious ulcer. The
duodenum, small bowel and colon are grossly normal without oral
contrast. Scattered colonic diverticulosis. The terminal ileum is
normal. The appendix is normal.

Vascular/Lymphatic: Scattered atherosclerotic calcifications
involving the aorta but no aneurysm or dissection. The major venous
structures are patent. No mesenteric or retroperitoneal mass or
adenopathy.

Reproductive: The prostate gland and seminal vesicles are
unremarkable.

Other: No pelvic mass or adenopathy. No free pelvic fluid
collections. No inguinal mass or adenopathy. No abdominal wall
hernia or subcutaneous lesions.

Musculoskeletal: No significant bony findings. A right hip
prosthesis is noted with significant artifact through the lower
pelvis. Remote compression deformity of L1 is noted.
IMPRESSION: Markedly thickened distal esophagus with some surrounding fluid.
Could not exclude a vomiting related esophageal injury. A chest CT
with ingestion of small amount of water-soluble oral contrast may be
helpful to exclude an esophageal leak. Patient also had esophageal
ulcers on endoscopy from [place].

No acute abdominal/ pelvic findings, mass lesions or adenopathy.

## 2020-04-30 ENCOUNTER — Emergency Department (HOSPITAL_BASED_OUTPATIENT_CLINIC_OR_DEPARTMENT_OTHER)
Admission: EM | Admit: 2020-04-30 | Discharge: 2020-04-30 | Disposition: A | Discharge status: Home / Self Care | Payer: 59 | Attending: Emergency Medicine | Admitting: Emergency Medicine

## 2020-04-30 ENCOUNTER — Emergency Department (HOSPITAL_BASED_OUTPATIENT_CLINIC_OR_DEPARTMENT_OTHER): Payer: 59

## 2020-04-30 ENCOUNTER — Encounter (HOSPITAL_BASED_OUTPATIENT_CLINIC_OR_DEPARTMENT_OTHER): Payer: Self-pay | Admitting: *Deleted

## 2020-04-30 ENCOUNTER — Other Ambulatory Visit: Payer: Self-pay

## 2020-04-30 DIAGNOSIS — I5032 Chronic diastolic (congestive) heart failure: Secondary | ICD-10-CM | POA: Insufficient documentation

## 2020-04-30 DIAGNOSIS — W19XXXA Unspecified fall, initial encounter: Secondary | ICD-10-CM | POA: Diagnosis not present

## 2020-04-30 DIAGNOSIS — S39012A Strain of muscle, fascia and tendon of lower back, initial encounter: Secondary | ICD-10-CM

## 2020-04-30 DIAGNOSIS — I110 Hypertensive heart disease with heart failure: Secondary | ICD-10-CM | POA: Diagnosis not present

## 2020-04-30 DIAGNOSIS — S3992XA Unspecified injury of lower back, initial encounter: Secondary | ICD-10-CM | POA: Diagnosis present

## 2020-04-30 DIAGNOSIS — Z96641 Presence of right artificial hip joint: Secondary | ICD-10-CM | POA: Diagnosis not present

## 2020-04-30 MED ORDER — DICLOFENAC SODIUM 1 % EX GEL: 2.0000 g | Freq: Four times a day (QID) | CUTANEOUS | 0 refills | Status: DC

## 2020-04-30 MED ORDER — METHOCARBAMOL 500 MG PO TABS: 500.0000 mg | ORAL_TABLET | Freq: Two times a day (BID) | ORAL | 0 refills | Status: DC

## 2020-04-30 MED ORDER — HYDROCODONE-ACETAMINOPHEN 5-325 MG PO TABS
1.0000 | ORAL_TABLET | Freq: Once | ORAL | Status: AC
  Administered 2020-04-30: 1 via ORAL
  Filled 2020-04-30: qty 1

## 2020-04-30 NOTE — Discharge Instructions (Addendum)
Take the medications to help with your symptoms. You can use a heating pad, stretching and massaging the area to help with symptoms as well. Follow-up with your primary care provider. Return to the ER if you start to experience worsening pain, injuries or falls, numbness in arms or legs, losing control of your bowels or bladder.

## 2020-04-30 NOTE — ED Provider Notes (Signed)
[facility] EMERGENCY DEPARTMENT Provider Note   CSN: [id number] Arrival date & time: 04/30/20  3094     History Chief Complaint  Patient presents with  . Back Pain    Logan [name] is a 85 y.o. male with a past medical history of HLD, prior stroke, upper GI bleed, anemia, prior back surgery after fall? 10 years ago, presenting to the ED with a chief complaint of back pain.  Family member at bedside provides some of history.  States that patient had a fall about 10 years ago in [place] and had surgery where "they put a ball in his back."  He has been fine up until 1 month ago when he started having pain again.  Reports pain in his left lower back radiating down his leg.  It has worsened over the past 2 days.  He has been taking NSAIDs without significant improvement in his pain.  States that pain is worse with ambulation, any type of movement and palpation.  No subsequent injury or trauma.  No numbness in arms or legs, loss of bowel or bladder function, dysuria, fever, shortness of breath, abdominal pain.  HPI     Past Medical History:  Diagnosis Date  . Anemia   . Cataract   . Esophageal ulcer   . Gastritis    proximal  . Hiatal hernia   . Hyperlipidemia   . Intractable hiccups   . Reflux esophagitis   . Stroke [facility])    r side deficits; September 2017  . Upper GI bleed     Patient Active Problem List   Diagnosis Date Noted  . S/P total right hip arthroplasty 04/30/2020  . Abnormal CT scan, esophagus   . Acute upper GI bleed 05/06/2017  . Hyperkalemia 05/06/2017  . Malnutrition of moderate degree 04/13/2017  . Gastroesophageal reflux disease with esophagitis   . Hyperglycemia 04/11/2017  . Malnutrition of mild degree ([place]) 04/11/2017  . Intractable vomiting with nausea   . Physical deconditioning   . Iron deficiency anemia   . Upper GI bleed   . Esophageal ulcer with bleeding   . Cardiomegaly   . Chronic diastolic CHF (congestive heart failure) ([place])   .  Intractable hiccups   . Cerebrovascular accident (CVA) ([place])   . SVT (supraventricular tachycardia) ([place])   . Hematemesis 03/03/2017    Past Surgical History:  Procedure Laterality Date  . COLONOSCOPY    . ESOPHAGOGASTRODUODENOSCOPY    . ESOPHAGOGASTRODUODENOSCOPY N/A 03/04/2017   Procedure: ESOPHAGOGASTRODUODENOSCOPY (EGD);  Surgeon: Mauri Pole, MD;  Location: [facility] ENDOSCOPY;  Service: Endoscopy;  Laterality: N/A;  . UPPER GASTROINTESTINAL ENDOSCOPY         Family History  Problem Relation Age of Onset  . Colon cancer Neg Hx   . Esophageal cancer Neg Hx   . Rectal cancer Neg Hx     Social History   Tobacco Use  . Smoking status: Never Smoker  . Smokeless tobacco: Never Used  Vaping Use  . Vaping Use: Never used  Substance Use Topics  . Alcohol use: No  . Drug use: No    Home Medications Prior to Admission medications   Medication Sig Start Date End Date Taking? Authorizing Provider  Baclofen 5 MG TABS Take 5 mg 3 (three) times daily as needed by mouth. 05/10/17   Georgette Shell, MD  chlorproMAZINE (THORAZINE) 25 MG tablet Take 1 tablet (25 mg total) by mouth 3 (three) times daily as needed. 03/09/17   Silvano Rusk  E, MD  diclofenac Sodium (VOLTAREN) 1 % GEL Apply 2 g topically 4 (four) times daily. 04/30/20   Vesper Trant, PA-C  feeding supplement, ENSURE ENLIVE, (ENSURE ENLIVE) LIQD Take 237 mLs by mouth 2 (two) times daily between meals. 04/13/17   Hongalgi, Lenis Dickinson, MD  methocarbamol (ROBAXIN) 500 MG tablet Take 1 tablet (500 mg total) by mouth 2 (two) times daily. 04/30/20   Cicily Bonano, PA-C  ondansetron (ZOFRAN ODT) 4 MG disintegrating tablet Take 1 tablet (4 mg total) by mouth every 6 (six) hours as needed for nausea or vomiting. 04/13/17   Hongalgi, Lenis Dickinson, MD  pantoprazole (PROTONIX) 40 MG tablet Take 1 tablet (40 mg total) 2 (two) times daily by mouth. 05/10/17   Georgette Shell, MD  ranitidine (ZANTAC) 150 MG/10ML syrup Take 20 mLs (300 mg  total) 2 (two) times daily by mouth. 05/10/17   Georgette Shell, MD  sucralfate (CARAFATE) 1 GM/10ML suspension Take 10 mLs (1 g total) by mouth 4 (four) times daily. 04/13/17 04/13/18  Modena Jansky, MD    Allergies    Patient has no known allergies.  Review of Systems   Review of Systems  Constitutional: Negative for appetite change, chills and fever.  HENT: Negative for ear pain, rhinorrhea, sneezing and sore throat.   Eyes: Negative for photophobia and visual disturbance.  Respiratory: Negative for cough, chest tightness, shortness of breath and wheezing.   Cardiovascular: Negative for chest pain and palpitations.  Gastrointestinal: Negative for abdominal pain, blood in stool, constipation, diarrhea, nausea and vomiting.  Genitourinary: Negative for dysuria, hematuria and urgency.  Musculoskeletal: Positive for back pain and myalgias.  Skin: Negative for rash.  Neurological: Negative for dizziness, weakness and light-headedness.    Physical Exam Updated Vital Signs BP (!) 147/75 (BP Location: Right Arm)   Pulse 96   Temp 98.3 F (36.8 C) (Oral)   Resp 16   Ht 5\' 4"  (1.626 m)   Wt 65.8 kg   SpO2 100%   BMI 24.89 kg/m   Physical Exam Vitals and nursing note reviewed.  Constitutional:      General: He is not in acute distress.    Appearance: He is well-developed.  HENT:     Head: Normocephalic and atraumatic.     Nose: Nose normal.  Eyes:     General: No scleral icterus.       Left eye: No discharge.     Conjunctiva/sclera: Conjunctivae normal.  Cardiovascular:     Rate and Rhythm: Normal rate and regular rhythm.     Heart sounds: Normal heart sounds. No murmur heard.  No friction rub. No gallop.   Pulmonary:     Effort: Pulmonary effort is normal. No respiratory distress.     Breath sounds: Normal breath sounds.  Abdominal:     General: Bowel sounds are normal. There is no distension.     Palpations: Abdomen is soft.     Tenderness: There is no  abdominal tenderness. There is no guarding.  Musculoskeletal:        General: Tenderness present. Normal range of motion.     Cervical back: Normal range of motion and neck supple.     Lumbar back: Tenderness present.       Back:     Comments: No midline spinal tenderness present in lumbar, thoracic or cervical spine. No step-off palpated. No visible bruising, edema or temperature change noted. No objective signs of numbness present. No saddle anesthesia. 2+ DP pulses bilaterally.  Sensation intact to light touch. Strength 5/5 in bilateral lower extremities. Normal ROM of bilateral hips.  Skin:    General: Skin is warm and dry.     Findings: No rash.  Neurological:     Mental Status: He is alert.     Motor: No abnormal muscle tone.     Coordination: Coordination normal.     ED Results / Procedures / Treatments   Labs (all labs ordered are listed, but only abnormal results are displayed) Labs Reviewed - No data to display  EKG None  Radiology DG Lumbar Spine Complete  Result Date: 04/30/2020 CLINICAL DATA:  One month history of left hip region pain. EXAM: LUMBAR SPINE - COMPLETE 4+ VIEW COMPARISON:  CT 05/06/2017 FINDINGS: Thoracolumbar curvature convex to the left. Old superior endplate compression deformity at L1 is unchanged. Lumbar region facet arthritis more marked on the right because of the spinal curvature. Ordinary osteo arthritic changes are noted of the sacroiliac joints. IMPRESSION: Thoracolumbar curvature convex to the left. Lumbar region facet arthritis more marked on the right because of that. Old superior endplate compression deformity at L1. No acute finding by radiography. Electronically Signed   By: Nelson Chimes M.D.   On: 04/30/2020 10:56   DG Hip Unilat W or Wo Pelvis 2-3 Views Left  Result Date: 04/30/2020 CLINICAL DATA:  Pain with hip surgery 10 years ago LEFT lateral and posterior hip pain worse in the last 4 days EXAM: DG HIP (WITH OR WITHOUT PELVIS) 2-3V LEFT  COMPARISON:  CT abdomen and pelvis of 04/05/2017 FINDINGS: Post RIGHT hip arthroplasty. No sign of pelvic fracture. Incidental note made of lumbar spine degenerative changes in the lower lumbar spine. LEFT hip is located.  No sign of fracture about the LEFT hip. IMPRESSION: Post RIGHT hip arthroplasty. No acute abnormality involving the pelvis or LEFT hip. Electronically Signed   By: Zetta Bills M.D.   On: 04/30/2020 10:55    Procedures Procedures (including critical care time)  Medications Ordered in ED Medications  HYDROcodone-acetaminophen (NORCO/VICODIN) 5-325 MG per tablet 1 tablet (1 tablet Oral Given 04/30/20 1026)    ED Course  I have reviewed the triage vital signs and the nursing notes.  Pertinent labs & imaging results that were available during my care of the patient were reviewed by me and considered in my medical decision making (see chart for details).    MDM Rules/Calculators/A&P                          85 year old male presenting to the ED with a chief complaint of left lower back pain.  Had a fall about 10 years ago and had surgery in [place].  For the past month he has been having left lower back pain rating down his leg that worsened the past 2 days despite taking NSAIDs.  No subsequent injury or trauma.  Patient denies any concerning symptoms suggestive of cauda equina requiring urgent imaging at this time such as loss of sensation in the lower extremities, lower extremity weakness, loss of bowel or bladder control, saddle anesthesia, urinary retention, fever/chills, IVDU. Exam demonstrated no  weakness on exam today. No preceding injury or trauma to suggest acute fracture. Doubt pelvic or urinary pathology for patient's acute back pain, as patient denies urinary symptoms. Doubt AAA as cause of patient's back pain as patient lacks major risk factors, had no abdominal TTP, and has symmetric and intact distal pulses.  However due to  his history with possible back surgery  imaging was obtained.  X-ray of the hip and pelvis showed a right hip arthroplasty which I believe was the surgery that he spoke of 10 years ago.  Left hip without any acute abnormalities and demonstrates normal range of motion on exam.  X-ray of the lumbar spine without any acute abnormalities.  Suspect musculoskeletal cause of symptoms.  Doubt fracture as he remains ambulatory.  Patient given strict return precautions for any symptoms indicating worsening neurologic function in the lower extremities. Patient discussed with and seen by the attending, Dr. Tomi Bamberger.  All imaging, if done today, including plain films, CT scans, and ultrasounds, independently reviewed by me, and interpretations confirmed via formal radiology reads.  Patient is hemodynamically stable, in NAD, and able to ambulate in the ED. Evaluation does not show pathology that would require ongoing emergent intervention or inpatient treatment. I explained the diagnosis to the patient. Pain has been managed and has no complaints prior to discharge. Patient is comfortable with above plan and is stable for discharge at this time. All questions were answered prior to disposition. Strict return precautions for returning to the ED were discussed. Encouraged follow up with PCP.   An After Visit Summary was printed and given to the patient.   Portions of this note were generated with [employer]. Dictation errors may occur despite best attempts at proofreading.  Final Clinical Impression(s) / ED Diagnoses Final diagnoses:  Strain of lumbar region, initial encounter  S/P total right hip arthroplasty    Rx / DC Orders ED Discharge Orders         Ordered    methocarbamol (ROBAXIN) 500 MG tablet  2 times daily        04/30/20 1130    diclofenac Sodium (VOLTAREN) 1 % GEL  4 times daily        04/30/20 [address], PA-C 04/30/20 1137    Dorie Rank, MD 05/04/20 [phone]

## 2020-04-30 NOTE — ED Triage Notes (Signed)
Back pain for a month and its worst in the last 2 days.

## 2020-04-30 NOTE — ED Notes (Signed)
Review D/C papers with pt, reviewed Rx with pt and family, pt states understanding, pt denies questions at this time.

## 2021-01-19 ENCOUNTER — Telehealth: Payer: Self-pay | Admitting: Hematology and Oncology

## 2021-01-19 NOTE — Telephone Encounter (Signed)
Scheduled appt per referral. Pt's son is aware.

## 2021-02-02 ENCOUNTER — Inpatient Hospital Stay: Payer: Medicare Other | Admitting: Hematology and Oncology

## 2021-02-10 ENCOUNTER — Emergency Department (HOSPITAL_COMMUNITY): Payer: 59

## 2021-02-10 ENCOUNTER — Encounter (HOSPITAL_COMMUNITY): Payer: Self-pay

## 2021-02-10 ENCOUNTER — Other Ambulatory Visit: Payer: Self-pay

## 2021-02-10 ENCOUNTER — Inpatient Hospital Stay (HOSPITAL_COMMUNITY)
Admission: EM | Admit: 2021-02-10 | Discharge: 2021-02-17 | DRG: 381 | Disposition: A | Discharge status: Home / Self Care | Payer: 59 | Attending: Family Medicine | Admitting: Family Medicine

## 2021-02-10 DIAGNOSIS — I2510 Atherosclerotic heart disease of native coronary artery without angina pectoris: Secondary | ICD-10-CM | POA: Diagnosis present

## 2021-02-10 DIAGNOSIS — K2210 Ulcer of esophagus without bleeding: Secondary | ICD-10-CM | POA: Diagnosis present

## 2021-02-10 DIAGNOSIS — N179 Acute kidney failure, unspecified: Secondary | ICD-10-CM | POA: Diagnosis not present

## 2021-02-10 DIAGNOSIS — K449 Diaphragmatic hernia without obstruction or gangrene: Secondary | ICD-10-CM | POA: Diagnosis present

## 2021-02-10 DIAGNOSIS — H9190 Unspecified hearing loss, unspecified ear: Secondary | ICD-10-CM | POA: Diagnosis present

## 2021-02-10 DIAGNOSIS — I509 Heart failure, unspecified: Secondary | ICD-10-CM | POA: Diagnosis present

## 2021-02-10 DIAGNOSIS — R809 Proteinuria, unspecified: Secondary | ICD-10-CM

## 2021-02-10 DIAGNOSIS — J449 Chronic obstructive pulmonary disease, unspecified: Secondary | ICD-10-CM | POA: Diagnosis not present

## 2021-02-10 DIAGNOSIS — I160 Hypertensive urgency: Secondary | ICD-10-CM | POA: Diagnosis not present

## 2021-02-10 DIAGNOSIS — N289 Disorder of kidney and ureter, unspecified: Secondary | ICD-10-CM

## 2021-02-10 DIAGNOSIS — D509 Iron deficiency anemia, unspecified: Secondary | ICD-10-CM | POA: Diagnosis not present

## 2021-02-10 DIAGNOSIS — E46 Unspecified protein-calorie malnutrition: Secondary | ICD-10-CM

## 2021-02-10 DIAGNOSIS — R109 Unspecified abdominal pain: Secondary | ICD-10-CM

## 2021-02-10 DIAGNOSIS — M7989 Other specified soft tissue disorders: Secondary | ICD-10-CM | POA: Diagnosis not present

## 2021-02-10 DIAGNOSIS — I130 Hypertensive heart and chronic kidney disease with heart failure and stage 1 through stage 4 chronic kidney disease, or unspecified chronic kidney disease: Secondary | ICD-10-CM | POA: Diagnosis present

## 2021-02-10 DIAGNOSIS — N183 Chronic kidney disease, stage 3 unspecified: Secondary | ICD-10-CM

## 2021-02-10 DIAGNOSIS — Z8673 Personal history of transient ischemic attack (TIA), and cerebral infarction without residual deficits: Secondary | ICD-10-CM

## 2021-02-10 DIAGNOSIS — I5021 Acute systolic (congestive) heart failure: Secondary | ICD-10-CM | POA: Diagnosis not present

## 2021-02-10 DIAGNOSIS — E8809 Other disorders of plasma-protein metabolism, not elsewhere classified: Secondary | ICD-10-CM | POA: Diagnosis not present

## 2021-02-10 DIAGNOSIS — D649 Anemia, unspecified: Secondary | ICD-10-CM | POA: Diagnosis not present

## 2021-02-10 DIAGNOSIS — Z20822 Contact with and (suspected) exposure to covid-19: Secondary | ICD-10-CM | POA: Diagnosis present

## 2021-02-10 DIAGNOSIS — R112 Nausea with vomiting, unspecified: Secondary | ICD-10-CM | POA: Diagnosis present

## 2021-02-10 DIAGNOSIS — K5730 Diverticulosis of large intestine without perforation or abscess without bleeding: Secondary | ICD-10-CM | POA: Diagnosis present

## 2021-02-10 DIAGNOSIS — Z8719 Personal history of other diseases of the digestive system: Secondary | ICD-10-CM | POA: Diagnosis not present

## 2021-02-10 DIAGNOSIS — N1832 Chronic kidney disease, stage 3b: Secondary | ICD-10-CM | POA: Diagnosis not present

## 2021-02-10 DIAGNOSIS — K2100 Gastro-esophageal reflux disease with esophagitis, without bleeding: Secondary | ICD-10-CM | POA: Diagnosis present

## 2021-02-10 DIAGNOSIS — Z96641 Presence of right artificial hip joint: Secondary | ICD-10-CM | POA: Diagnosis present

## 2021-02-10 DIAGNOSIS — I5032 Chronic diastolic (congestive) heart failure: Secondary | ICD-10-CM | POA: Diagnosis not present

## 2021-02-10 DIAGNOSIS — K257 Chronic gastric ulcer without hemorrhage or perforation: Secondary | ICD-10-CM | POA: Diagnosis not present

## 2021-02-10 DIAGNOSIS — N189 Chronic kidney disease, unspecified: Secondary | ICD-10-CM | POA: Diagnosis not present

## 2021-02-10 DIAGNOSIS — N1831 Chronic kidney disease, stage 3a: Secondary | ICD-10-CM | POA: Diagnosis not present

## 2021-02-10 DIAGNOSIS — E785 Hyperlipidemia, unspecified: Secondary | ICD-10-CM | POA: Diagnosis not present

## 2021-02-10 DIAGNOSIS — I10 Essential (primary) hypertension: Secondary | ICD-10-CM

## 2021-02-10 LAB — RETICULOCYTES
Immature Retic Fract: 8.1 % (ref 2.3–15.9)
RBC.: 3.92 MIL/uL — ABNORMAL LOW (ref 4.22–5.81)
Retic Count, Absolute: 33.7 10*3/uL (ref 19.0–186.0)
Retic Ct Pct: 0.9 % (ref 0.4–3.1)

## 2021-02-10 LAB — CBC WITH DIFFERENTIAL/PLATELET
Abs Immature Granulocytes: 0.05 10*3/uL (ref 0.00–0.07)
Basophils Absolute: 0 10*3/uL (ref 0.0–0.1)
Basophils Relative: 1 %
Eosinophils Absolute: 0.1 10*3/uL (ref 0.0–0.5)
Eosinophils Relative: 1 %
HCT: 28 % — ABNORMAL LOW (ref 39.0–52.0)
Hemoglobin: 8.1 g/dL — ABNORMAL LOW (ref 13.0–17.0)
Immature Granulocytes: 1 %
Lymphocytes Relative: 18 %
Lymphs Abs: 1.5 10*3/uL (ref 0.7–4.0)
MCH: 20.7 pg — ABNORMAL LOW (ref 26.0–34.0)
MCHC: 28.9 g/dL — ABNORMAL LOW (ref 30.0–36.0)
MCV: 71.6 fL — ABNORMAL LOW (ref 80.0–100.0)
Monocytes Absolute: 0.4 10*3/uL (ref 0.1–1.0)
Monocytes Relative: 5 %
Neutro Abs: 6.4 10*3/uL (ref 1.7–7.7)
Neutrophils Relative %: 74 %
Platelets: 419 10*3/uL — ABNORMAL HIGH (ref 150–400)
RBC: 3.91 MIL/uL — ABNORMAL LOW (ref 4.22–5.81)
RDW: 19.8 % — ABNORMAL HIGH (ref 11.5–15.5)
WBC: 8.5 10*3/uL (ref 4.0–10.5)
nRBC: 0 % (ref 0.0–0.2)

## 2021-02-10 LAB — PROTEIN / CREATININE RATIO, URINE
Creatinine, Urine: 88.31 mg/dL
Protein Creatinine Ratio: <0.3 mg/mg{creat} — ABNORMAL HIGH (ref 0.00–0.15)
Total Protein, Urine: 907 mg/dL

## 2021-02-10 LAB — COMPREHENSIVE METABOLIC PANEL
ALT: 18 U/L (ref 0–44)
AST: 27 U/L (ref 15–41)
Albumin: 1.9 g/dL — ABNORMAL LOW (ref 3.5–5.0)
Alkaline Phosphatase: 69 U/L (ref 38–126)
Anion gap: 6 (ref 5–15)
BUN: 20 mg/dL (ref 8–23)
CO2: 26 mmol/L (ref 22–32)
Calcium: 8 mg/dL — ABNORMAL LOW (ref 8.9–10.3)
Chloride: 109 mmol/L (ref 98–111)
Creatinine, Ser: 2.08 mg/dL — ABNORMAL HIGH (ref 0.61–1.24)
GFR, Estimated: 31 mL/min — ABNORMAL LOW (ref >60)
Glucose, Bld: 109 mg/dL — ABNORMAL HIGH (ref 70–99)
Potassium: 3.1 mmol/L — ABNORMAL LOW (ref 3.5–5.1)
Sodium: 141 mmol/L (ref 135–145)
Total Bilirubin: 0.3 mg/dL (ref 0.3–1.2)
Total Protein: 5.3 g/dL — ABNORMAL LOW (ref 6.5–8.1)

## 2021-02-10 LAB — URINALYSIS, ROUTINE W REFLEX MICROSCOPIC
Bilirubin Urine: NEGATIVE
Glucose, UA: 50 mg/dL — AB
Ketones, ur: NEGATIVE mg/dL
Leukocytes,Ua: NEGATIVE
Nitrite: NEGATIVE
Protein, ur: >=300 mg/dL — AB
Specific Gravity, Urine: 1.027 (ref 1.005–1.030)
pH: 7 (ref 5.0–8.0)

## 2021-02-10 LAB — LIPID PANEL
Cholesterol: 279 mg/dL — ABNORMAL HIGH (ref 0–200)
HDL: 75 mg/dL (ref >40)
LDL Cholesterol: 179 mg/dL — ABNORMAL HIGH (ref 0–99)
Total CHOL/HDL Ratio: 3.7 RATIO
Triglycerides: 126 mg/dL (ref <150)
VLDL: 25 mg/dL (ref 0–40)

## 2021-02-10 LAB — LACTIC ACID, PLASMA: Lactic Acid, Venous: 1.1 mmol/L (ref 0.5–1.9)

## 2021-02-10 LAB — LIPASE, BLOOD: Lipase: 29 U/L (ref 11–51)

## 2021-02-10 MED ORDER — PANTOPRAZOLE SODIUM 40 MG IV SOLR
40.0000 mg | Freq: Two times a day (BID) | INTRAVENOUS | Status: DC
  Administered 2021-02-11 – 2021-02-16 (×11): 40 mg via INTRAVENOUS
  Filled 2021-02-10 (×11): qty 40

## 2021-02-10 MED ORDER — ONDANSETRON HCL 4 MG/2ML IJ SOLN: 4.0000 mg | Freq: Four times a day (QID) | INTRAMUSCULAR | Status: DC | PRN

## 2021-02-10 MED ORDER — ATORVASTATIN CALCIUM 20 MG PO TABS
20.0000 mg | ORAL_TABLET | Freq: Every day | ORAL | Status: DC
  Administered 2021-02-12 – 2021-02-16 (×5): 20 mg via ORAL
  Filled 2021-02-10 (×6): qty 1

## 2021-02-10 MED ORDER — ALBUMIN HUMAN 25 % IV SOLN
25.0000 g | Freq: Four times a day (QID) | INTRAVENOUS | Status: AC
  Administered 2021-02-11 – 2021-02-12 (×7): 25 g via INTRAVENOUS
  Filled 2021-02-10 (×8): qty 100

## 2021-02-10 MED ORDER — FENTANYL CITRATE (PF) 100 MCG/2ML IJ SOLN
50.0000 ug | Freq: Once | INTRAMUSCULAR | Status: AC
Start: 2021-02-10 — End: 2021-02-10
  Administered 2021-02-10: 50 ug via INTRAVENOUS
  Filled 2021-02-10: qty 2

## 2021-02-10 MED ORDER — ACETAMINOPHEN 500 MG PO TABS
1000.0000 mg | ORAL_TABLET | Freq: Four times a day (QID) | ORAL | Status: DC | PRN
  Administered 2021-02-12: 1000 mg via ORAL
  Filled 2021-02-10 (×2): qty 2

## 2021-02-10 MED ORDER — LIDOCAINE 5 % EX PTCH
1.0000 | MEDICATED_PATCH | CUTANEOUS | Status: DC
  Administered 2021-02-10 – 2021-02-13 (×4): 1 via TRANSDERMAL
  Filled 2021-02-10 (×7): qty 1

## 2021-02-10 MED ORDER — PANTOPRAZOLE SODIUM 40 MG PO TBEC: 40.0000 mg | DELAYED_RELEASE_TABLET | Freq: Two times a day (BID) | ORAL | Status: DC

## 2021-02-10 MED ORDER — IOHEXOL 350 MG/ML SOLN
100.0000 mL | Freq: Once | INTRAVENOUS | Status: AC | PRN
  Administered 2021-02-10: 65 mL via INTRAVENOUS

## 2021-02-10 MED ORDER — ENSURE ENLIVE PO LIQD
237.0000 mL | Freq: Two times a day (BID) | ORAL | Status: DC
  Administered 2021-02-11: 237 mL via ORAL

## 2021-02-10 MED ORDER — POTASSIUM CHLORIDE CRYS ER 20 MEQ PO TBCR
40.0000 meq | EXTENDED_RELEASE_TABLET | Freq: Once | ORAL | Status: AC
  Administered 2021-02-10: 40 meq via ORAL
  Filled 2021-02-10: qty 2

## 2021-02-10 MED ORDER — SODIUM CHLORIDE 0.9 % IV BOLUS: 1000.0000 mL | Freq: Once | INTRAVENOUS | Status: DC

## 2021-02-10 MED ORDER — ONDANSETRON HCL 4 MG PO TABS
4.0000 mg | ORAL_TABLET | Freq: Four times a day (QID) | ORAL | Status: DC | PRN
  Administered 2021-02-11: 4 mg via ORAL
  Filled 2021-02-10: qty 1

## 2021-02-10 MED ORDER — METOCLOPRAMIDE HCL 5 MG PO TABS
5.0000 mg | ORAL_TABLET | Freq: Four times a day (QID) | ORAL | Status: DC
  Administered 2021-02-10 – 2021-02-14 (×15): 5 mg via ORAL
  Filled 2021-02-10 (×15): qty 1

## 2021-02-10 MED ORDER — SODIUM CHLORIDE 0.9 % IV BOLUS
500.0000 mL | Freq: Once | INTRAVENOUS | Status: AC
  Administered 2021-02-10: 500 mL via INTRAVENOUS

## 2021-02-10 MED ORDER — PANTOPRAZOLE SODIUM 40 MG IV SOLR
40.0000 mg | Freq: Once | INTRAVENOUS | Status: AC
  Administered 2021-02-10: 40 mg via INTRAVENOUS
  Filled 2021-02-10: qty 40

## 2021-02-10 MED ORDER — ACETAMINOPHEN 325 MG PO TABS
650.0000 mg | ORAL_TABLET | Freq: Once | ORAL | Status: AC
  Administered 2021-02-10: 650 mg via ORAL

## 2021-02-10 MED ORDER — HYDRALAZINE HCL 25 MG PO TABS
25.0000 mg | ORAL_TABLET | Freq: Four times a day (QID) | ORAL | Status: DC
  Administered 2021-02-10 – 2021-02-14 (×16): 25 mg via ORAL
  Filled 2021-02-10 (×16): qty 1

## 2021-02-10 MED ORDER — HYDROMORPHONE HCL 1 MG/ML IJ SOLN
0.5000 mg | INTRAMUSCULAR | Status: DC | PRN
  Administered 2021-02-10 – 2021-02-12 (×6): 0.5 mg via INTRAVENOUS
  Filled 2021-02-10 (×3): qty 0.5
  Filled 2021-02-10: qty 1
  Filled 2021-02-10 (×2): qty 0.5

## 2021-02-10 MED ORDER — FUROSEMIDE 10 MG/ML IJ SOLN
40.0000 mg | Freq: Once | INTRAMUSCULAR | Status: AC
  Administered 2021-02-10: 40 mg via INTRAVENOUS
  Filled 2021-02-10: qty 4

## 2021-02-10 MED ORDER — HYDRALAZINE HCL 20 MG/ML IJ SOLN
5.0000 mg | Freq: Four times a day (QID) | INTRAMUSCULAR | Status: DC | PRN
  Administered 2021-02-10 – 2021-02-15 (×5): 5 mg via INTRAVENOUS
  Filled 2021-02-10 (×6): qty 1

## 2021-02-10 MED ORDER — CARVEDILOL 12.5 MG PO TABS
12.5000 mg | ORAL_TABLET | Freq: Two times a day (BID) | ORAL | Status: DC
  Administered 2021-02-10 – 2021-02-16 (×12): 12.5 mg via ORAL
  Filled 2021-02-10 (×12): qty 1

## 2021-02-10 NOTE — H&P (Signed)
History and Physical    Logan [name] [id number] DOB: 1935/02/09 DOA: 02/10/2021  PCP: Jilda Panda, MD (Confirm with patient/family/NH records and if not entered, this has to be entered at [facility] point of entry) Patient coming from: [facility]  I have personally briefly reviewed patient's old medical records in [place]  Chief Complaint: Abdominal pain, nauseous vomit  HPI: Logan [name] is a 86 y.o. male with medical history significant of gastric ulcer/GI bleed, chronic iron deficiency anemia, HTN, chronic diastolic CHF stroke, HLD, CKD stage II, presented with worsening of epigastric abdominal pain, repeated nauseous vomit.  History collected but help with interpreter and patient's son over the phone as well.  His symptoms started about 10 days ago, with epigastric cramping-like abdominal pain, worsening with eating and drinking.  Did not develop frequent nauseous and vomit, stomach content no blood no coffee-ground stuff.  No diarrhea.  No relieving or exacerbating factors.  No fever or chills.  ED Course: Blood pressure significantly elevated, blood pressure acute AKI in 60s creatinine 2.0 compared to baseline 1.3, hemoglobin 8.1 compared to baseline 10.  CT abdomen with oral contrast showed bilateral pleural effusion and small amount of ascites.  Review of Systems: As per HPI otherwise 14 point review of systems negative.    Past Medical History:  Diagnosis Date   Anemia    Cataract    Esophageal ulcer    Gastritis    proximal   Hiatal hernia    Hyperlipidemia    Intractable hiccups    Reflux esophagitis    Stroke ([place])    r side deficits; September 2017   Upper GI bleed     Past Surgical History:  Procedure Laterality Date   COLONOSCOPY     ESOPHAGOGASTRODUODENOSCOPY     ESOPHAGOGASTRODUODENOSCOPY N/A 03/04/2017   Procedure: ESOPHAGOGASTRODUODENOSCOPY (EGD);  Surgeon: Mauri Pole, MD;  Location: [facility] ENDOSCOPY;  Service: Endoscopy;  Laterality: N/A;   UPPER  GASTROINTESTINAL ENDOSCOPY       reports that he has never smoked. He has never used smokeless tobacco. He reports that he does not drink alcohol and does not use drugs.  No Known Allergies  Family History  Problem Relation Age of Onset   Colon cancer Neg Hx    Esophageal cancer Neg Hx    Rectal cancer Neg Hx      Prior to Admission medications   Medication Sig Start Date End Date Taking? Authorizing Provider  acetaminophen (TYLENOL) 500 MG tablet Take 1,000 mg by mouth every 6 (six) hours as needed for mild pain.   Yes [provider]  atorvastatin (LIPITOR) 20 MG tablet Take 20 mg by mouth daily. 01/12/21  Yes [provider]  carvedilol (COREG) 6.25 MG tablet Take 6.25 mg by mouth 2 (two) times daily. 01/12/21  Yes [provider]  metoCLOPramide (REGLAN) 5 MG tablet Take 5 mg by mouth 4 (four) times daily. 09/25/20  Yes [provider]  pantoprazole (PROTONIX) 40 MG tablet Take 1 tablet (40 mg total) 2 (two) times daily by mouth. 05/10/17  Yes Georgette Shell, MD  diclofenac Sodium (VOLTAREN) 1 % GEL Apply 2 g topically 4 (four) times daily. Patient not taking: Reported on 02/10/2021 04/30/20   Delia Heady, PA-C  feeding supplement, ENSURE ENLIVE, (ENSURE ENLIVE) LIQD Take 237 mLs by mouth 2 (two) times daily between meals. Patient not taking: Reported on 02/10/2021 04/13/17   Modena Jansky, MD  methocarbamol (ROBAXIN) 500 MG tablet Take 1 tablet (500 mg  total) by mouth 2 (two) times daily. Patient not taking: Reported on 02/10/2021 04/30/20   Delia Heady, PA-C    Physical Exam: Vitals:   02/10/21 1415 02/10/21 1430 02/10/21 1445 02/10/21 1530  BP: (!) 198/87 (!) 207/94 (!) 207/118 (!) 192/112  Pulse: (!) 57 73 90 (!) 108  Resp: '12 13 13 19  '$ Temp:      TempSrc:      SpO2: 100% 98% 100% 100%    Constitutional: NAD, calm, comfortable Vitals:   02/10/21 1415 02/10/21 1430 02/10/21 1445 02/10/21 1530  BP: (!) 198/87 (!) 207/94 (!)  207/118 (!) 192/112  Pulse: (!) 57 73 90 (!) 108  Resp: '12 13 13 19  '$ Temp:      TempSrc:      SpO2: 100% 98% 100% 100%   Eyes: PERRL, lids and conjunctivae normal ENMT: Mucous membranes are dry. Posterior pharynx clear of any exudate or lesions.Normal dentition.  Neck: normal, supple, no masses, no thyromegaly Respiratory: clear to auscultation bilaterally, no wheezing, fine crackles on bilateral bases.  Increasing respiratory effort. No accessory muscle use.  Cardiovascular: Regular rate and rhythm, no murmurs / rubs / gallops. 1+ extremity edema. 2+ pedal pulses. No carotid bruits.  Abdomen: Severe tenderness on RUQ area, no rebound no guarding. no masses palpated. No hepatosplenomegaly. Bowel sounds positive.  Musculoskeletal: no clubbing / cyanosis. No joint deformity upper and lower extremities. Good ROM, no contractures. Normal muscle tone.  Skin: no rashes, lesions, ulcers. No induration Neurologic: CN 2-12 grossly intact. Sensation intact, DTR normal. Strength 5/5 in all 4.  Psychiatric: Normal judgment and insight. Alert and oriented x 3. Normal mood.     Labs on Admission: I have personally reviewed following labs and imaging studies  CBC: Recent Labs  Lab 02/10/21 0909  WBC 8.5  NEUTROABS 6.4  HGB 8.1*  HCT 28.0*  MCV 71.6*  PLT 123XX123*   Basic Metabolic Panel: Recent Labs  Lab 02/10/21 0909  NA 141  K 3.1*  CL 109  CO2 26  GLUCOSE 109*  BUN 20  CREATININE 2.08*  CALCIUM 8.0*   GFR: CrCl cannot be calculated (Unknown ideal weight.). Liver Function Tests: Recent Labs  Lab 02/10/21 0909  AST 27  ALT 18  ALKPHOS 69  BILITOT 0.3  PROT 5.3*  ALBUMIN 1.9*   Recent Labs  Lab 02/10/21 0909  LIPASE 29   No results for input(s): AMMONIA in the last 168 hours. Coagulation Profile: No results for input(s): INR, PROTIME in the last 168 hours. Cardiac Enzymes: No results for input(s): CKTOTAL, CKMB, CKMBINDEX, TROPONINI in the last 168 hours. BNP (last 3  results) No results for input(s): PROBNP in the last 8760 hours. HbA1C: No results for input(s): HGBA1C in the last 72 hours. CBG: No results for input(s): GLUCAP in the last 168 hours. Lipid Profile: No results for input(s): CHOL, HDL, LDLCALC, TRIG, CHOLHDL, LDLDIRECT in the last 72 hours. Thyroid Function Tests: No results for input(s): TSH, T4TOTAL, FREET4, T3FREE, THYROIDAB in the last 72 hours. Anemia Panel: Recent Labs    02/10/21 0909  RETICCTPCT 0.9   Urine analysis:    Component Value Date/Time   COLORURINE YELLOW 02/10/2021 1345   APPEARANCEUR HAZY (A) 02/10/2021 1345   LABSPEC 1.027 02/10/2021 1345   PHURINE 7.0 02/10/2021 1345   GLUCOSEU 50 (A) 02/10/2021 1345   HGBUR SMALL (A) 02/10/2021 1345   BILIRUBINUR NEGATIVE 02/10/2021 1345   [place] 02/10/2021 1345   PROTEINUR >=300 (A) 02/10/2021 1345  NITRITE NEGATIVE 02/10/2021 [place] 02/10/2021 1345    Radiological Exams on Admission: CT ABDOMEN PELVIS W CONTRAST  Result Date: 02/10/2021 CLINICAL DATA:  Right lower quadrant pain EXAM: CT ABDOMEN AND PELVIS WITH CONTRAST TECHNIQUE: Multidetector CT imaging of the abdomen and pelvis was performed using the standard protocol following bolus administration of intravenous contrast. CONTRAST:  101m OMNIPAQUE IOHEXOL 350 MG/ML SOLN COMPARISON:  CT abdomen/pelvis 11/26/2016 FINDINGS: Lower chest: There are bilateral pleural effusions with adjacent subsegmental atelectasis. The imaged heart is unremarkable. There is no pericardial effusion. Hepatobiliary: Tiny hypodense lesions in the liver are too small to characterize, but likely reflects cysts. There are no suspicious focal lesions. The gallbladder is normal. There is no intra or extrahepatic biliary ductal dilatation. Pancreas: There are no focal lesions or contour abnormalities. There is no main pancreatic ductal dilatation or peripancreatic inflammatory change. Spleen: Unremarkable.  Adrenals/Urinary Tract: The adrenals are unremarkable. There is cortical thinning of the left kidney likely reflecting scarring. Hypodense lesions in the left kidney are too small to characterize but likely reflects cysts, not significantly changed. There are no other focal lesions. There are no calculi. There is no hydronephrosis or hydroureter. The bladder is not well assessed due to decompression and streak artifact from right hip arthroplasty hardware. Stomach/Bowel: The stomach is unremarkable. There is no evidence of bowel obstruction. The appendix is unremarkable. There is no abnormal bowel wall thickening or inflammatory change. There are scattered colonic diverticuli without evidence of acute diverticulosis. Vascular/Lymphatic: There is calcified atherosclerotic plaque throughout the nonaneurysmal abdominal aorta and at the origins of the major branch vessels. The SMA, celiac axis, and IMA are patent. The main portal and splenic veins are patent. There is no abdominal or pelvic lymphadenopathy. Reproductive: The prostate and seminal vesicles are grossly unremarkable but suboptimally assessed due to streak artifact. Other: There is scattered free fluid in the abdomen and pelvis. Fluid is also seen in the left inguinal canal. There is no free intraperitoneal air. There is mild diffuse body wall edema. Musculoskeletal: The patient is status post right hip arthroplasty. There is mild anterior wedge deformity of the L1 vertebral body, unchanged since 2018. There is multilevel degenerative change throughout the remainder of the lumbar spine. IMPRESSION: 1. Bilateral pleural effusions, mild diffuse body wall edema, and small volume ascites in the abdomen pelvis raises the possibility of fluid overload. 2. Otherwise, no acute findings in the abdomen or pelvis. Specifically, no evidence of appendicitis. 3. Scattered colonic diverticula. 4.  Aortic Atherosclerosis (ICD10-I70.0). Electronically Signed   By: PValetta MoleM.D.   On: 02/10/2021 11:56    EKG: Independently reviewed.  Sinus rhythm, no acute ST-T changes, borderline prolonged QTC  Assessment/Plan Active Problems:   Acute CHF (congestive heart failure) (HCC)   [place]) [place]  (please populate well all problems here in Problem List. (For example, if patient is on BP meds at home and you resume or decide to hold them, it is a problem that needs to be her. Same for CAD, COPD, HLD and so on)  Intractable abdominal pain -RUQ ultrasound ordered to rule out gallstones.  CT abdomen reassuring.  Given the strong history of gastric ulcer and GI bleed in the past, suspect this is a recurrent peptic ulcer pain.  Start PPI twice daily, discussed with on-call [facility] Dr. MWatt Climes who will evaluate patient tomorrow.  Clear liquid diet tonight and n.p.o. after midnight in case EGD. -Use colloid instead of crystalloid for  hydration for severe hypoalbuminemia.  Uncontrolled hypertension/acute on chronic diastolic CHF decompensation -Resume Coreg, increased to 12.5 mg twice daily, add hydralazine and as needed hydralazine IV. -1 dose of Lasix given, reevaluate volume status tomorrow to decide further diuresis plan.  AKI on CKD -Suspect cardiorenal syndrome secondary to uncontrolled hypertension -Mild fluid overload, 1 dose of Lasix given. -Urinalysis showed large quantity of protein, check urine protein/creatinine -Abdominal ultrasound pending  Acute on chronic microcytic anemia -Suspect GI bleed, patient denied any black tarry stools, check iron study -Repeat CBC in the morning, transfuse if patient becomes symptomatic or significant drop of hemoglobin.  Chronic hypoalbuminemia -Suspect nephrotic syndrome/nephritis, check lipid panel, urine protein/creatinine ratio. -Stringent blood pressure control.  HLD -Continue statin   DVT prophylaxis: SCD Code Status: Full code Family Communication: Son over the phone Disposition Plan:  Expect more than 2 midnight hospital stay for GI work-up Consults called: [facility] Admission status: Telemetry admission   Lequita Halt MD [facility] Pager [phone]  02/10/2021, 3:51 PM

## 2021-02-10 NOTE — ED Notes (Addendum)
"  [place]" Patients son would like a call back with an update [phone]

## 2021-02-10 NOTE — ED Provider Notes (Signed)
[facility] DEPT Provider Note   CSN: [id number] Arrival date & time: 02/10/21  [id number]     History Chief Complaint  Patient presents with   Abdominal Pain   Nausea    Logan [name] is a 86 y.o. male.  HPI     86 year old male comes in with chief complaint of abdominal pain, nausea and vomiting.  He has history of hiatal hernia, esophagitis, stroke and upper GI bleed.  He speaks in [place], I was able to procure history myself.  Patient reports that he started having abdominal pain in the middle of the night.  The pain has been constant, severe, and he was nauseated.  He had emesis x1 earlier today.  No history of similar pain in the past.  No UTI-like symptoms.  No back pain.  Past Medical History:  Diagnosis Date   Anemia    Cataract    Esophageal ulcer    Gastritis    proximal   Hiatal hernia    Hyperlipidemia    Intractable hiccups    Reflux esophagitis    Stroke ([place])    r side deficits; September 2017   Upper GI bleed     Patient Active Problem List   Diagnosis Date Noted   Acute CHF (congestive heart failure) ([place]) 02/10/2021   CHF (congestive heart failure) ([place]) 02/10/2021   Acute on chronic renal insufficiency    Hypoalbuminemia    Acute systolic CHF (congestive heart failure) (HCC)    S/P total right hip arthroplasty 04/30/2020   Abnormal CT scan, esophagus    Acute upper GI bleed 05/06/2017   Hyperkalemia 05/06/2017   Malnutrition of moderate degree 04/13/2017   Gastroesophageal reflux disease with esophagitis    Hyperglycemia 04/11/2017   Malnutrition of mild degree ([place]) 04/11/2017   Intractable vomiting with nausea    Physical deconditioning    Iron deficiency anemia    Upper GI bleed    Esophageal ulcer with bleeding    Cardiomegaly    Chronic diastolic CHF (congestive heart failure) (HCC)    Intractable hiccups    Cerebrovascular accident (CVA) ([place])    SVT (supraventricular tachycardia) ([place])     Hematemesis 03/03/2017    Past Surgical History:  Procedure Laterality Date   COLONOSCOPY     ESOPHAGOGASTRODUODENOSCOPY     ESOPHAGOGASTRODUODENOSCOPY N/A 03/04/2017   Procedure: ESOPHAGOGASTRODUODENOSCOPY (EGD);  Surgeon: Mauri Pole, MD;  Location: [facility] ENDOSCOPY;  Service: Endoscopy;  Laterality: N/A;   UPPER GASTROINTESTINAL ENDOSCOPY         Family History  Problem Relation Age of Onset   Colon cancer Neg Hx    Esophageal cancer Neg Hx    Rectal cancer Neg Hx     Social History   Tobacco Use   Smoking status: Never   Smokeless tobacco: Never  Vaping Use   Vaping Use: Never used  Substance Use Topics   Alcohol use: No   Drug use: No    Home Medications Prior to Admission medications   Medication Sig Start Date End Date Taking? Authorizing Provider  acetaminophen (TYLENOL) 500 MG tablet Take 1,000 mg by mouth every 6 (six) hours as needed for mild pain.   Yes [provider]  atorvastatin (LIPITOR) 20 MG tablet Take 20 mg by mouth daily. 01/12/21  Yes [provider]  carvedilol (COREG) 6.25 MG tablet Take 6.25 mg by mouth 2 (two) times daily. 01/12/21  Yes [provider]  metoCLOPramide (REGLAN) 5 MG tablet Take 5  mg by mouth 4 (four) times daily. 09/25/20  Yes [provider]  pantoprazole (PROTONIX) 40 MG tablet Take 1 tablet (40 mg total) 2 (two) times daily by mouth. 05/10/17  Yes Georgette Shell, MD  diclofenac Sodium (VOLTAREN) 1 % GEL Apply 2 g topically 4 (four) times daily. Patient not taking: Reported on 02/10/2021 04/30/20   Delia Heady, PA-C  feeding supplement, ENSURE ENLIVE, (ENSURE ENLIVE) LIQD Take 237 mLs by mouth 2 (two) times daily between meals. Patient not taking: Reported on 02/10/2021 04/13/17   Modena Jansky, MD  methocarbamol (ROBAXIN) 500 MG tablet Take 1 tablet (500 mg total) by mouth 2 (two) times daily. Patient not taking: Reported on 02/10/2021 04/30/20   Delia Heady, PA-C    Allergies     Patient has no known allergies.  Review of Systems   Review of Systems  Constitutional:  Positive for activity change.  Respiratory:  Negative for shortness of breath.   Cardiovascular:  Negative for chest pain.  Gastrointestinal:  Positive for abdominal pain, nausea and vomiting.  Genitourinary:  Negative for dysuria.  All other systems reviewed and are negative.  Physical Exam Updated Vital Signs BP (!) 214/107   Pulse 86   Temp 98.1 F (36.7 C) (Oral)   Resp 11   SpO2 100%   Physical Exam Vitals and nursing note reviewed.  Constitutional:      Appearance: He is well-developed.  HENT:     Head: Atraumatic.  Cardiovascular:     Rate and Rhythm: Normal rate.  Pulmonary:     Effort: Pulmonary effort is normal.  Abdominal:     Tenderness: There is abdominal tenderness in the right lower quadrant, suprapubic area and left lower quadrant. There is guarding. There is no rebound.  Musculoskeletal:     Cervical back: Neck supple.  Skin:    General: Skin is warm.  Neurological:     Mental Status: He is alert and oriented to person, place, and time.    ED Results / Procedures / Treatments   Labs (all labs ordered are listed, but only abnormal results are displayed) Labs Reviewed  COMPREHENSIVE METABOLIC PANEL - Abnormal; Notable for the following components:      Result Value   Potassium 3.1 (*)    Glucose, Bld 109 (*)    Creatinine, Ser 2.08 (*)    Calcium 8.0 (*)    Total Protein 5.3 (*)    Albumin 1.9 (*)    GFR, Estimated 31 (*)    All other components within normal limits  CBC WITH DIFFERENTIAL/PLATELET - Abnormal; Notable for the following components:   RBC 3.91 (*)    Hemoglobin 8.1 (*)    HCT 28.0 (*)    MCV 71.6 (*)    MCH 20.7 (*)    MCHC 28.9 (*)    RDW 19.8 (*)    Platelets 419 (*)    All other components within normal limits  URINALYSIS, ROUTINE W REFLEX MICROSCOPIC - Abnormal; Notable for the following components:   APPearance HAZY (*)     Glucose, UA 50 (*)    Hgb urine dipstick SMALL (*)    Protein, ur >=300 (*)    Bacteria, UA RARE (*)    All other components within normal limits  RETICULOCYTES - Abnormal; Notable for the following components:   RBC. 3.92 (*)    All other components within normal limits  SARS CORONAVIRUS 2 (TAT 6-24 HRS)  LIPASE, BLOOD  LACTIC ACID, PLASMA  IRON AND TIBC  FERRITIN  PROTEIN / CREATININE RATIO, URINE  MAGNESIUM  LIPID PANEL  BASIC METABOLIC PANEL  CBC    EKG None  Radiology CT ABDOMEN PELVIS W CONTRAST  Result Date: 02/10/2021 CLINICAL DATA:  Right lower quadrant pain EXAM: CT ABDOMEN AND PELVIS WITH CONTRAST TECHNIQUE: Multidetector CT imaging of the abdomen and pelvis was performed using the standard protocol following bolus administration of intravenous contrast. CONTRAST:  31m OMNIPAQUE IOHEXOL 350 MG/ML SOLN COMPARISON:  CT abdomen/pelvis 11/26/2016 FINDINGS: Lower chest: There are bilateral pleural effusions with adjacent subsegmental atelectasis. The imaged heart is unremarkable. There is no pericardial effusion. Hepatobiliary: Tiny hypodense lesions in the liver are too small to characterize, but likely reflects cysts. There are no suspicious focal lesions. The gallbladder is normal. There is no intra or extrahepatic biliary ductal dilatation. Pancreas: There are no focal lesions or contour abnormalities. There is no main pancreatic ductal dilatation or peripancreatic inflammatory change. Spleen: Unremarkable. Adrenals/Urinary Tract: The adrenals are unremarkable. There is cortical thinning of the left kidney likely reflecting scarring. Hypodense lesions in the left kidney are too small to characterize but likely reflects cysts, not significantly changed. There are no other focal lesions. There are no calculi. There is no hydronephrosis or hydroureter. The bladder is not well assessed due to decompression and streak artifact from right hip arthroplasty hardware. Stomach/Bowel: The  stomach is unremarkable. There is no evidence of bowel obstruction. The appendix is unremarkable. There is no abnormal bowel wall thickening or inflammatory change. There are scattered colonic diverticuli without evidence of acute diverticulosis. Vascular/Lymphatic: There is calcified atherosclerotic plaque throughout the nonaneurysmal abdominal aorta and at the origins of the major branch vessels. The SMA, celiac axis, and IMA are patent. The main portal and splenic veins are patent. There is no abdominal or pelvic lymphadenopathy. Reproductive: The prostate and seminal vesicles are grossly unremarkable but suboptimally assessed due to streak artifact. Other: There is scattered free fluid in the abdomen and pelvis. Fluid is also seen in the left inguinal canal. There is no free intraperitoneal air. There is mild diffuse body wall edema. Musculoskeletal: The patient is status post right hip arthroplasty. There is mild anterior wedge deformity of the L1 vertebral body, unchanged since 2018. There is multilevel degenerative change throughout the remainder of the lumbar spine. IMPRESSION: 1. Bilateral pleural effusions, mild diffuse body wall edema, and small volume ascites in the abdomen pelvis raises the possibility of fluid overload. 2. Otherwise, no acute findings in the abdomen or pelvis. Specifically, no evidence of appendicitis. 3. Scattered colonic diverticula. 4.  Aortic Atherosclerosis (ICD10-I70.0). Electronically Signed   By: PValetta MoleM.D.   On: 02/10/2021 11:56   [place]Abdomen Limited  Result Date: 02/10/2021 CLINICAL DATA:  Right upper quadrant abdominal pain. EXAM: ULTRASOUND ABDOMEN LIMITED RIGHT UPPER QUADRANT COMPARISON:  CT abdomen pelvis 05/06/2017, CT abdomen pelvis 02/10/2021 FINDINGS: Gallbladder: Markedly limited evaluation due to overlying bowel gas. No definite gallstones or wall thickening visualized. No sonographic Murphy sign noted by sonographer. Common bile duct: Diameter: 6 mm.  Liver: No focal lesion identified. Within normal limits in parenchymal echogenicity. Portal vein is patent on color Doppler imaging with normal direction of blood flow towards the liver. Other: Trace simple free fluid within right upper quadrant. IMPRESSION: 1. Markedly limited/nondiagnostic evaluation of the gallbladder due to overlying bowel gas. 2. Trace simple free fluid within the upper abdomen. Electronically Signed   By: MIven FinnM.D.   On: 02/10/2021 16:35  Procedures Procedures   Medications Ordered in ED Medications  HYDROmorphone (DILAUDID) injection 0.5 mg (0.5 mg Intravenous Given 02/10/21 1554)  lidocaine (LIDODERM) 5 % 1 patch (has no administration in time range)  feeding supplement (ENSURE ENLIVE / ENSURE PLUS) liquid 237 mL (has no administration in time range)  acetaminophen (TYLENOL) tablet 1,000 mg (has no administration in time range)  atorvastatin (LIPITOR) tablet 20 mg (has no administration in time range)  carvedilol (COREG) tablet 12.5 mg (has no administration in time range)  hydrALAZINE (APRESOLINE) tablet 25 mg (has no administration in time range)  metoCLOPramide (REGLAN) tablet 5 mg (has no administration in time range)  pantoprazole (PROTONIX) injection 40 mg (has no administration in time range)  hydrALAZINE (APRESOLINE) injection 5 mg (5 mg Intravenous Given 02/10/21 1554)  ondansetron (ZOFRAN) tablet 4 mg (has no administration in time range)    Or  ondansetron (ZOFRAN) injection 4 mg (has no administration in time range)  albumin human 25 % solution 25 g (has no administration in time range)  potassium chloride SA (KLOR-CON) CR tablet 40 mEq (has no administration in time range)  furosemide (LASIX) injection 40 mg (has no administration in time range)  fentaNYL (SUBLIMAZE) injection 50 mcg (50 mcg Intravenous Given 02/10/21 0910)  pantoprazole (PROTONIX) injection 40 mg (40 mg Intravenous Given 02/10/21 0910)  iohexol (OMNIPAQUE) 350 MG/ML injection  100 mL (65 mLs Intravenous Contrast Given 02/10/21 1111)  acetaminophen (TYLENOL) tablet 650 mg (650 mg Oral Given 02/10/21 1315)  sodium chloride 0.9 % bolus 500 mL (0 mLs Intravenous Stopped 02/10/21 1430)    ED Course  I have reviewed the triage vital signs and the nursing notes.  Pertinent labs & imaging results that were available during my care of the patient were reviewed by me and considered in my medical decision making (see chart for details).    MDM Rules/Calculators/A&P                            86 year old male comes in with chief complaint of abdominal pain.  He is reporting right-sided abdominal pain, on exam he has lower quadrant tenderness, worse over the right lower quadrant with some guarding, no rebound.  Abdomen is still soft.  He has mild right upper quadrant tenderness as well.   Suspicion is high for acute appendicitis.  Perforation, diverticulitis, colitis are also possible.  Doubt cholecystitis, PUD.  We will get CT abdomen pelvis with contrast and reassess.   5:26 PM Reassessed into 2 separate times. Abdominal pain remained tolerable.  CT scan was normal.  Results discussed with him and his son.  UA looked clean.  Unfortunately creatinine has gone up, albumin has come down.  Patient's son states that patient has deteriorated over the past week or so.  He is just laying down and not doing anything.  Additionally, the pain is not well controlled with OTC medications.  Discussed case with hospitalist service.  They will admit the patient for hypertensive urgency, acute on chronic renal failure, and failure to thrive.  Final Clinical Impression(s) / ED Diagnoses Final diagnoses:  Acute on chronic renal insufficiency  Hypoalbuminemia  Hypertensive urgency  Protein malnutrition [facility])    Rx / DC Orders ED Discharge Orders     None        Varney Biles, MD 02/10/21 1727

## 2021-02-10 NOTE — ED Triage Notes (Signed)
EMS reports from home, Pt c/o abdominal pain right side x 2 days and nausea x 1 week with constipation.  BP 160/90 HR 100 RR 18 SP02 100 RA CBG 126

## 2021-02-10 NOTE — ED Notes (Signed)
Albumin not approved by pharmacy at this time.  Unable to administer at this time

## 2021-02-10 NOTE — ED Notes (Signed)
Patient requesting food.  Informed patient that he can have water.  Patient continues to ask for food and does not want water at this time

## 2021-02-10 NOTE — ED Notes (Signed)
Patient assisted to use room to call son.

## 2021-02-11 ENCOUNTER — Inpatient Hospital Stay (HOSPITAL_COMMUNITY): Payer: 59 | Admitting: Anesthesiology

## 2021-02-11 ENCOUNTER — Encounter (HOSPITAL_COMMUNITY): Payer: Self-pay | Admitting: Internal Medicine

## 2021-02-11 ENCOUNTER — Encounter (HOSPITAL_COMMUNITY)
Admission: EM | Disposition: A | Discharge status: Home / Self Care | Payer: Self-pay | Source: Home / Self Care | Attending: Family Medicine

## 2021-02-11 ENCOUNTER — Inpatient Hospital Stay (HOSPITAL_COMMUNITY): Payer: 59

## 2021-02-11 DIAGNOSIS — D649 Anemia, unspecified: Secondary | ICD-10-CM

## 2021-02-11 DIAGNOSIS — I5032 Chronic diastolic (congestive) heart failure: Secondary | ICD-10-CM

## 2021-02-11 DIAGNOSIS — N179 Acute kidney failure, unspecified: Secondary | ICD-10-CM

## 2021-02-11 DIAGNOSIS — R109 Unspecified abdominal pain: Secondary | ICD-10-CM

## 2021-02-11 DIAGNOSIS — N1831 Chronic kidney disease, stage 3a: Secondary | ICD-10-CM

## 2021-02-11 DIAGNOSIS — I10 Essential (primary) hypertension: Secondary | ICD-10-CM

## 2021-02-11 DIAGNOSIS — N183 Chronic kidney disease, stage 3 unspecified: Secondary | ICD-10-CM

## 2021-02-11 HISTORY — PX: ESOPHAGOGASTRODUODENOSCOPY (EGD) WITH PROPOFOL: SHX5813

## 2021-02-11 HISTORY — PX: BIOPSY: SHX5522

## 2021-02-11 LAB — SARS CORONAVIRUS 2 (TAT 6-24 HRS): SARS Coronavirus 2: NEGATIVE

## 2021-02-11 LAB — CBC
HCT: 28.5 % — ABNORMAL LOW (ref 39.0–52.0)
Hemoglobin: 8.4 g/dL — ABNORMAL LOW (ref 13.0–17.0)
MCH: 20.9 pg — ABNORMAL LOW (ref 26.0–34.0)
MCHC: 29.5 g/dL — ABNORMAL LOW (ref 30.0–36.0)
MCV: 70.9 fL — ABNORMAL LOW (ref 80.0–100.0)
Platelets: 371 10*3/uL (ref 150–400)
RBC: 4.02 MIL/uL — ABNORMAL LOW (ref 4.22–5.81)
RDW: 20.1 % — ABNORMAL HIGH (ref 11.5–15.5)
WBC: 12 10*3/uL — ABNORMAL HIGH (ref 4.0–10.5)
nRBC: 1.2 % — ABNORMAL HIGH (ref 0.0–0.2)

## 2021-02-11 LAB — BASIC METABOLIC PANEL
Anion gap: 8 (ref 5–15)
BUN: 19 mg/dL (ref 8–23)
CO2: 24 mmol/L (ref 22–32)
Calcium: 8.2 mg/dL — ABNORMAL LOW (ref 8.9–10.3)
Chloride: 108 mmol/L (ref 98–111)
Creatinine, Ser: 1.91 mg/dL — ABNORMAL HIGH (ref 0.61–1.24)
GFR, Estimated: 34 mL/min — ABNORMAL LOW (ref >60)
Glucose, Bld: 82 mg/dL (ref 70–99)
Potassium: 3.9 mmol/L (ref 3.5–5.1)
Sodium: 140 mmol/L (ref 135–145)

## 2021-02-11 LAB — MAGNESIUM: Magnesium: 2 mg/dL (ref 1.7–2.4)

## 2021-02-11 SURGERY — ESOPHAGOGASTRODUODENOSCOPY (EGD) WITH PROPOFOL
Anesthesia: Monitor Anesthesia Care

## 2021-02-11 MED ORDER — ENSURE ENLIVE PO LIQD
237.0000 mL | Freq: Three times a day (TID) | ORAL | Status: DC
  Administered 2021-02-11 – 2021-02-16 (×9): 237 mL via ORAL

## 2021-02-11 MED ORDER — LIDOCAINE HCL (CARDIAC) PF 100 MG/5ML IV SOSY
PREFILLED_SYRINGE | INTRAVENOUS | Status: DC | PRN
  Administered 2021-02-11: 100 mg via INTRAVENOUS

## 2021-02-11 MED ORDER — LACTATED RINGERS IV SOLN
INTRAVENOUS | Status: AC | PRN
  Administered 2021-02-11: 20 mL/h via INTRAVENOUS

## 2021-02-11 MED ORDER — PROPOFOL 500 MG/50ML IV EMUL
INTRAVENOUS | Status: DC | PRN
  Administered 2021-02-11: 100 ug/kg/min via INTRAVENOUS

## 2021-02-11 MED ORDER — ADULT MULTIVITAMIN W/MINERALS CH
1.0000 | ORAL_TABLET | Freq: Every day | ORAL | Status: DC
  Administered 2021-02-12 – 2021-02-16 (×5): 1 via ORAL
  Filled 2021-02-11 (×5): qty 1

## 2021-02-11 MED ORDER — SODIUM CHLORIDE 0.9 % IV SOLN: INTRAVENOUS | Status: DC

## 2021-02-11 SURGICAL SUPPLY — 15 items

## 2021-02-11 NOTE — Anesthesia Preprocedure Evaluation (Addendum)
Anesthesia Evaluation  Patient identified by MRN, date of birth, ID band Patient awake    Reviewed: Allergy & Precautions, H&P , NPO status , Patient's Chart, lab work & pertinent test results  Airway Mallampati: II  TM Distance: >3 FB Neck ROM: Full    Dental no notable dental hx. (+) Teeth Intact, Dental Advisory Given   Pulmonary neg pulmonary ROS,    Pulmonary exam normal breath sounds clear to auscultation       Cardiovascular +CHF   Rhythm:Regular Rate:Normal     Neuro/Psych CVA, Residual Symptoms negative psych ROS   GI/Hepatic Neg liver ROS, hiatal hernia, PUD,   Endo/Other  negative endocrine ROS  Renal/GU Renal InsufficiencyRenal disease  negative genitourinary   Musculoskeletal   Abdominal   Peds  Hematology  (+) Blood dyscrasia, anemia ,   Anesthesia Other Findings   Reproductive/Obstetrics negative OB ROS                            Anesthesia Physical Anesthesia Plan  ASA: 3  Anesthesia Plan: MAC   Post-op Pain Management:    Induction: Intravenous  PONV Risk Score and Plan: 1 and Propofol infusion  Airway Management Planned: Simple Face Mask  Additional Equipment:   Intra-op Plan:   Post-operative Plan:   Informed Consent: I have reviewed the patients History and Physical, chart, labs and discussed the procedure including the risks, benefits and alternatives for the proposed anesthesia with the patient or authorized representative who has indicated his/her understanding and acceptance.     Dental advisory given  Plan Discussed with: CRNA  Anesthesia Plan Comments:         Anesthesia Quick Evaluation

## 2021-02-11 NOTE — Progress Notes (Signed)
Pt c/o pain on left side PRN IV dilaudid given with relief. Jennye Moccasin 4:28 AM 02/11/21

## 2021-02-11 NOTE — Progress Notes (Addendum)
Pt admitted for CHF A&OX4, pt native language is [place]. Pt on tele NSR. Pt c/o pain with prn Dilaudid with relief. Pt blood pressure BP (!) 187/90 (BP Location: Right Arm)   Pulse 88   Temp 98.4 F (36.9 C) (Oral)   Resp 13   SpO2 96%  PRN Apresoline given with positive results.  Pt oriented to room best to this recorders ability through the use of a translator. No questions at this time. Call bell within reach, bed inlowest position, bed alarm on. Pt was informed by translator he is on clear liquid diet until midnight then NPO Louanne Skye 2:29 AM 02/11/21

## 2021-02-11 NOTE — Progress Notes (Signed)
PROGRESS NOTE  Logan [name] [id number] DOB: [birth year] DOA: 02/10/2021 PCP: Jilda Panda, MD  Brief History   86 year old man PMH gastric ulcer, GI bleed, chronic iron deficiency anemia presenting with epigastric abdominal pain, nausea, vomiting.  Admitted for further evaluation and GI consultation. EGD revealed LA grade a reflux esophagitis with no bleeding.     A & P  * Intractable abdominal pain -- Etiology unclear.  Right upper quadrant ultrasound was unremarkable as was CT of the abdomen and pelvis.  Given history of gastric ulcer disease, seen by gastroenterology, EGD relatively unrevealing showing LA grade a esophagitis.  Continue PPI.  Diet as per GI. -- Unclear whether improved, interview was not possible today given patient lack of verbal response to son and translation service. -- No signs or symptoms of severe process  Anemia -- Suspect iron deficiency, last seen 2018.  Check anemia panel.  Hemoglobin stable thus far.  CBC in AM.  AKI (acute kidney injury) ([place]) -- Etiology unclear.  Possibly superimposed on CKD stage IIIa.  Not on diuretic or ACE inhibitor. -- Somewhat better today.  Baselines unclear but may be around 1.4 with last values in 2018. -- oral intake, check BMP in AM. -- Difficult to assess given body wall edema seen on CT which may be from hypoalbuminemia.  Patient has no signs or symptoms to suggest decompensated CHF.  Possible renal function is at baseline.  CKD (chronic kidney disease), stage III ([place]) --see AKI  Benign essential HTN -- Uncontrolled on admission.  Now better controlled.  Chronic diastolic CHF (congestive heart failure) (HCC) -- Apparently with acute component on admission, I do not see any evidence of decompensation at this point. -- Continue carvedilol, hydralazine.  Apparently not on a diuretic as an outpatient.  Follow volume status clinically.  Disposition Plan:  Discussion: Difficult case given language barrier.  Status  is: Inpatient  Remains inpatient appropriate because:Inpatient level of care appropriate due to severity of illness  Dispo: The patient is from: Home              Anticipated d/c is to: Home              Patient currently is not medically stable to d/c.   Difficult to place patient No  DVT prophylaxis: SCDs Start: 02/10/21 1548   Code Status: Full Code Level of care: Telemetry Family Communication: son  Logan Hodgkins, MD  [facility] Direct contact: see [email or web address] (further directions at bottom of note if needed) 7PM-7AM contact night coverage as at bottom of note 02/11/2021, 7:09 PM  LOS: 1 day   Significant Hospital Events      Consults:  GI   Procedures:    Significant Diagnostic Tests:     Micro Data:     Antimicrobials:    Interval History/Subjective  CC: f/u abd pain  Not clear whether the patient has pain on the right side of the abdomen or not.  I attempted to interview with hospital provided interpreter according to the language documented in the medical record, although speech was clear, the patient did not respond to the interpreter.  I also called the patient's son and put him on speaker phone and again the patient although awake and alert did not respond to his son.  Therefore unable to obtain any information from the patient.  Objective   Vitals:  Vitals:   02/11/21 1040 02/11/21 1730  BP: (!) (P) 171/89 140/64  Pulse: 96 (!) 107  Resp: 12 15  Temp:  98.4 F (36.9 C)  SpO2: 100% 98%    Exam: Physical Exam Constitutional:      General: He is not in acute distress.    Appearance: He is not toxic-appearing.  Cardiovascular:     Rate and Rhythm: Normal rate and regular rhythm.     Heart sounds: No murmur heard. Pulmonary:     Effort: Pulmonary effort is normal. No respiratory distress.     Breath sounds: Normal breath sounds.  Abdominal:     General: There is no distension.     Tenderness: There is no abdominal tenderness. There is  no guarding.  Neurological:     Mental Status: He is alert.  Psychiatric:     Comments: Cannot assess    I have personally reviewed the labs and other data, making special note of:   Today's Data  Creatinine slightly better 1.91, appears to be above baseline although last documented value 2018 1.4. Hemoglobin stable 8.4   Scheduled Meds:  atorvastatin  20 mg Oral Daily   carvedilol  12.5 mg Oral BID   feeding supplement  237 mL Oral TID BM   hydrALAZINE  25 mg Oral Q6H   lidocaine  1 patch Transdermal Q24H   metoCLOPramide  5 mg Oral QID   multivitamin with minerals  1 tablet Oral Daily   pantoprazole (PROTONIX) IV  40 mg Intravenous Q12H   Continuous Infusions:  albumin human 25 g (02/11/21 1728)    Principal Problem:   Intractable abdominal pain Active Problems:   AKI (acute kidney injury) ([place])   Anemia   CKD (chronic kidney disease), stage III (HCC)   Chronic diastolic CHF (congestive heart failure) ([place])   Benign essential HTN   LOS: 1 day   How to contact the [facility] Attending or Consulting provider 7A - 7P or covering provider during after hours [place], for this patient?  Check the care team in [facility] and look for a) attending/consulting TR[facility] provider listed and b) the [facility] team listed Log into [email or web address] and use [place]'s universal password to access. If you do not have the password, please contact the hospital operator. Locate the [facility] provider you are looking for under [facility] and page to a number that you can be directly reached. If you still have difficulty reaching the provider, please page the [facility] (Director on Call) for the Hospitalists listed on amion for assistance.

## 2021-02-11 NOTE — Anesthesia Postprocedure Evaluation (Signed)
Anesthesia Post Note  Patient: Logan [name]  Procedure(s) Performed: ESOPHAGOGASTRODUODENOSCOPY (EGD) WITH PROPOFOL BIOPSY     Patient location during evaluation: PACU Anesthesia Type: MAC Level of consciousness: awake and alert Pain management: pain level controlled Vital Signs Assessment: post-procedure vital signs reviewed and stable Respiratory status: spontaneous breathing, nonlabored ventilation and respiratory function stable Cardiovascular status: stable and blood pressure returned to baseline Postop Assessment: no apparent nausea or vomiting Anesthetic complications: no   No notable events documented.  Last Vitals:  Vitals:   02/11/21 1030 02/11/21 1040  BP: (!) (P) 152/67 (!) (P) 171/89  Pulse: 94 96  Resp: 10 12  Temp:    SpO2: 98% 100%    Last Pain:  Vitals:   02/11/21 0913  TempSrc: Oral  PainSc: 0-No pain                 Alverna Fawley,W. EDMOND

## 2021-02-11 NOTE — Assessment & Plan Note (Signed)
--   Apparently with acute component on admission, I do not see any evidence of decompensation at this point. -- Continue carvedilol, hydralazine.  Apparently not on a diuretic as an outpatient.  Follow volume status clinically.

## 2021-02-11 NOTE — Progress Notes (Signed)
Initial Nutrition Assessment  DOCUMENTATION CODES:  Not applicable  INTERVENTION:  Advance diet as medically able and as tolerated.  Increase Ensure Plus from BID to TID.  Add Magic cup TID with meals, each supplement provides 290 kcal and 9 grams of protein.  Add MVI with minerals daily.  NUTRITION DIAGNOSIS:  Inadequate oral intake related to nausea, vomiting as evidenced by per patient/family report.  GOAL:  Patient will meet greater than or equal to 90% of their needs  MONITOR:  Diet advancement, PO intake, Supplement acceptance, Labs, Weight trends, I & O's  REASON FOR ASSESSMENT:  Malnutrition Screening Tool    ASSESSMENT:  86 yo male with PMH of gastric ulcer/GI bleed, chronic iron deficiency anemia, HTN, chronic diastolic CHF, stroke, HLD, CKD stage 2, presented with worsening of epigastric abdominal pain and repeated nausea/vomiting. His symptoms started about 10 days ago, with epigastric cramping-like abdominal pain, worsening with eating and drinking. Did not develop frequent N/V, stomach content no blood no coffee-ground stuff. No diarrhea. No relieving or exacerbating factors. Admitted with acute CHF. 8/17 - EGD  RD working remotely. Pt with no interpreter available by phone, so unable to obtain nutrition history at this time.  Pt with no meal percentages documented.   Pt with no recent weight history in Epic or Care Everywhere.  Recommend increasing EE BID to TID, adding Magic Cup TID, and adding MVI with minerals daily.  Medications: reviewed; EE BID, Reglan QID, Protonix BID, Dilaudid PRN (given once today)  Labs: reviewed  NUTRITION - FOCUSED PHYSICAL EXAM: Unable to perform  Diet Order:   Diet Order             Diet NPO time specified  Diet effective midnight                  EDUCATION NEEDS:  Not appropriate for education at this time  Skin:  Skin Assessment: Reviewed RN Assessment  Last BM:  unknown  Height:  Ht Readings from Last  1 Encounters:  02/11/21 '5\' 2"'$  (1.575 m)   Weight:  Wt Readings from Last 1 Encounters:  02/11/21 64.4 kg   BMI:  Body mass index is 25.97 kg/m.  Estimated Nutritional Needs:  Kcal:  1550-1750 Protein:  75-90 grams Fluid:  >1.5 L  Derrel Nip, RD, LDN (she/her/hers) Registered Dietitian I After-Hours/Weekend Pager # in [place]

## 2021-02-11 NOTE — Op Note (Signed)
[facility] Patient Name: Logan [name] Procedure Date: 02/11/2021 MRN: [id number] Attending MD: Clarene Essex , MD Date of Birth: 1935-04-02 CSN: [id number] Age: 86 Admit Type: Inpatient Procedure:                Upper GI endoscopy Indications:              Abdominal pain in the right upper quadrant, Nausea                            with vomiting Providers:                Clarene Essex, MD, Carlyn Reichert, RN, Benetta Spar,                            Technician Referring MD:              Medicines:                Propofol total dose 120 mg IV, 60 mg IV lidocaine Complications:            No immediate complications. Estimated Blood Loss:     Estimated blood loss: none. Procedure:                Pre-Anesthesia Assessment:                           - Prior to the procedure, a History and Physical                            was performed, and patient medications and                            allergies were reviewed. The patient's tolerance of                            previous anesthesia was also reviewed. The risks                            and benefits of the procedure and the sedation                            options and risks were discussed with the patient.                            All questions were answered, and informed consent                            was obtained. Prior Anticoagulants: The patient has                            taken no previous anticoagulant or antiplatelet                            agents. ASA Grade Assessment: III - A patient with  severe systemic disease. After reviewing the risks                            and benefits, the patient was deemed in                            satisfactory condition to undergo the procedure.                           After obtaining informed consent, the endoscope was                            passed under direct vision. Throughout the                            procedure, the  patient's blood pressure, pulse, and                            oxygen saturations were monitored continuously. The                            [id number] Olympus endoscope was introduced                            through the mouth, and advanced to the fourth part                            of duodenum. The upper GI endoscopy was                            accomplished without difficulty. The patient                            tolerated the procedure well. Scope In: Scope Out: Findings:      A small hiatal hernia was present.      LA Grade A (one or more mucosal breaks less than 5 mm, not extending       between tops of 2 mucosal folds) esophagitis with no bleeding was found.       There were a few white plaques coating the distal esophagus and biopsies       were taken with a cold forceps for histology.      The entire examined stomach was normal.      The duodenal bulb, first portion of the duodenum, second portion of the       duodenum, third portion of the duodenum and fourth portion of the       duodenum were normal.      The exam was otherwise without abnormality. Impression:               - Small hiatal hernia.                           - LA Grade A reflux esophagitis with no bleeding.  Biopsied.                           - Normal stomach.                           - Normal duodenal bulb, first portion of the                            duodenum, second portion of the duodenum, third                            portion of the duodenum and fourth portion of the                            duodenum.                           - The examination was otherwise normal. Moderate Sedation:      Not Applicable - Patient had care per Anesthesia. Recommendation:           - Soft diet today.                           - Continue present medications.                           - Await pathology results.                           - Return to GI clinic PRN.  Please call me if I can                            be of any further assistance with this hospital                            stay otherwise we will check on tomorrow                           - Telephone GI clinic for pathology results in 5                            days.                           - Perform a CCK HIDA (hepatobiliary iminodiacetic                            acid) scan if symptoms persist. And probably follow                            with surgical consult for consideration of                            cholecystectomy Procedure Code(s):        --- Professional ---  [id number], Esophagogastroduodenoscopy, flexible,                            transoral; with biopsy, single or multiple Diagnosis Code(s):        --- Professional ---                           K44.9, Diaphragmatic hernia without obstruction or                            gangrene                           K21.00, Gastro-esophageal reflux disease with                            esophagitis, without bleeding                           R10.11, Right upper quadrant pain                           R11.2, Nausea with vomiting, unspecified CPT copyright 2019 American Medical Association. All rights reserved. The codes documented in this report are preliminary and upon coder review may  be revised to meet current compliance requirements. Clarene Essex, MD 02/11/2021 10:27:09 AM This report has been signed electronically. Number of Addenda: 0

## 2021-02-11 NOTE — Consult Note (Signed)
Reason for Consult: Abdominal pain nausea vomiting history of ulcers Referring Physician: Hospital team  Logan [name] is an 86 y.o. male.  HPI: Patient seen and examined and we used the translating video service and has had abdominal pain mostly on the right for about a week with increased nausea and vomiting and labs ultrasound and CT were nondiagnostic and he has a history of ulcers and we were asked to proceed with an endoscopy which was discussed with the patient  Past Medical History:  Diagnosis Date   Anemia    Cataract    Esophageal ulcer    Gastritis    proximal   Hiatal hernia    Hyperlipidemia    Intractable hiccups    Reflux esophagitis    Stroke ([place])    r side deficits; September 2017   Upper GI bleed     Past Surgical History:  Procedure Laterality Date   COLONOSCOPY     ESOPHAGOGASTRODUODENOSCOPY     ESOPHAGOGASTRODUODENOSCOPY N/A 03/04/2017   Procedure: ESOPHAGOGASTRODUODENOSCOPY (EGD);  Surgeon: Mauri Pole, MD;  Location[facility] ENDOSCOPY;  Service: Endoscopy;  Laterality: N/A;   UPPER GASTROINTESTINAL ENDOSCOPY      Family History  Problem Relation Age of Onset   Colon cancer Neg Hx    Esophageal cancer Neg Hx    Rectal cancer Neg Hx     Social History:  reports that he has never smoked. He has never used smokeless tobacco. He reports that he does not drink alcohol and does not use drugs.  Allergies: No Known Allergies  Medications: I have reviewed the patient's current medications.  Results for orders placed or performed during the hospital encounter of 02/10/21 (from the past 48 hour(s))  Comprehensive metabolic panel     Status: Abnormal   Collection Time: 02/10/21  9:09 AM  Result Value Ref Range   Sodium 141 135 - 145 mmol/L   Potassium 3.1 (L) 3.5 - 5.1 mmol/L   Chloride 109 98 - 111 mmol/L   CO2 26 22 - 32 mmol/L   Glucose, Bld 109 (H) 70 - 99 mg/dL    Comment: Glucose reference range applies only to samples taken after fasting for at  least 8 hours.   BUN 20 8 - 23 mg/dL   Creatinine, Ser 2.08 (H) 0.61 - 1.24 mg/dL   Calcium 8.0 (L) 8.9 - 10.3 mg/dL   Total Protein 5.3 (L) 6.5 - 8.1 g/dL   Albumin 1.9 (L) 3.5 - 5.0 g/dL   AST 27 15 - 41 U/L   ALT 18 0 - 44 U/L   Alkaline Phosphatase 69 38 - 126 U/L   Total Bilirubin 0.3 0.3 - 1.2 mg/dL   GFR, Estimated 31 (L) >60 mL/min    Comment: (NOTE) Calculated using the CKD-EPI Creatinine Equation (2021)    Anion gap 6 5 - 15    Comment: Performed at [facility], [place] [address] [place], [place] [address]  CBC with Differential     Status: Abnormal   Collection Time: 02/10/21  9:09 AM  Result Value Ref Range   WBC 8.5 4.0 - 10.5 K/uL   RBC 3.91 (L) 4.22 - 5.81 MIL/uL   Hemoglobin 8.1 (L) 13.0 - 17.0 g/dL    Comment: Reticulocyte Hemoglobin testing may be clinically indicated, consider ordering this additional test [id number]    HCT 28.0 (L) 39.0 - 52.0 %   MCV 71.6 (L) 80.0 - 100.0 fL   MCH 20.7 (L) 26.0 - 34.0 pg  MCHC 28.9 (L) 30.0 - 36.0 g/dL   RDW 19.8 (H) 11.5 - 15.5 %   Platelets 419 (H) 150 - 400 K/uL   nRBC 0.0 0.0 - 0.2 %   Neutrophils Relative % 74 %   Neutro Abs 6.4 1.7 - 7.7 K/uL   Lymphocytes Relative 18 %   Lymphs Abs 1.5 0.7 - 4.0 K/uL   Monocytes Relative 5 %   Monocytes Absolute 0.4 0.1 - 1.0 K/uL   Eosinophils Relative 1 %   Eosinophils Absolute 0.1 0.0 - 0.5 K/uL   Basophils Relative 1 %   Basophils Absolute 0.0 0.0 - 0.1 K/uL   Immature Granulocytes 1 %   Abs Immature Granulocytes 0.05 0.00 - 0.07 K/uL    Comment: Performed at [facility], [place] [address] [place], [place] [address]  Lipase, blood     Status: None   Collection Time: 02/10/21  9:09 AM  Result Value Ref Range   Lipase 29 11 - 51 U/L    Comment: Performed at [facility], [place] [address] [place], [place] [address]  Reticulocytes     Status: Abnormal   Collection Time: 02/10/21  9:09 AM  Result Value Ref Range    Retic Ct Pct 0.9 0.4 - 3.1 %   RBC. 3.92 (L) 4.22 - 5.81 MIL/uL   Retic Count, Absolute 33.7 19.0 - 186.0 K/uL   Immature Retic Fract 8.1 2.3 - 15.9 %    Comment: Performed at [facility], [place] [address], [place],  [address]  Urinalysis, Routine w reflex microscopic     Status: Abnormal   Collection Time: 02/10/21  1:45 PM  Result Value Ref Range   Color, Urine YELLOW YELLOW   APPearance HAZY (A) CLEAR   Specific Gravity, Urine 1.027 1.005 - 1.030   pH 7.0 5.0 - 8.0   Glucose, UA 50 (A) NEGATIVE mg/dL   Hgb urine dipstick SMALL (A) NEGATIVE   Bilirubin Urine NEGATIVE NEGATIVE   Ketones, ur NEGATIVE NEGATIVE mg/dL   Protein, ur >=300 (A) NEGATIVE mg/dL   Nitrite NEGATIVE NEGATIVE   Leukocytes,Ua NEGATIVE NEGATIVE   RBC / HPF 6-10 0 - 5 RBC/hpf   WBC, UA 0-5 0 - 5 WBC/hpf   Bacteria, UA RARE (A) NONE SEEN   Hyaline Casts, UA PRESENT     Comment: Performed at [facility], [place] [address], [place], [place] [address]  SARS CORONAVIRUS 2 (TAT 6-24 HRS) Nasopharyngeal Nasopharyngeal Swab     Status: None   Collection Time: 02/10/21  3:03 PM   Specimen: Nasopharyngeal Swab  Result Value Ref Range   SARS Coronavirus 2 NEGATIVE NEGATIVE    Comment: (NOTE) SARS-CoV-2 target nucleic acids are NOT DETECTED.  The SARS-CoV-2 RNA is generally detectable in upper and lower respiratory specimens during the acute phase of infection. Negative results do not preclude SARS-CoV-2 infection, do not rule out co-infections with other pathogens, and should not be used as the sole basis for treatment or other patient management decisions. Negative results must be combined with clinical observations, patient history, and epidemiological information. The expected result is Negative.  Fact Sheet for Patients: SugarRoll.be  Fact Sheet for Healthcare Providers: https://www.woods-mathews.com/  This test is not yet  approved or cleared by the [place] FDA and  has been authorized for detection and/or diagnosis of SARS-CoV-2 by FDA under an Emergency Use Authorization (EUA). This EUA will remain  in effect (meaning this test can be used) for  the duration of the COVID-19 declaration under Se ction 564(b)(1) of the Act, 21 U.S.C. section 360bbb-3(b)(1), unless the authorization is terminated or revoked sooner.  Performed at [facility], [place] [address], [place], [place] [address]   Protein / creatinine ratio, urine     Status: Abnormal   Collection Time: 02/10/21  3:46 PM  Result Value Ref Range   Creatinine, Urine 88.31 mg/dL   Total Protein, Urine 907 mg/dL    Comment: NO NORMAL RANGE ESTABLISHED FOR THIS TEST RESULTS CONFIRMED BY MANUAL DILUTION    Protein Creatinine Ratio <0.30 (H) 0.00 - 0.15 mg/mg[Cre]    Comment: Performed at [facility], [place] [address] [place], [place] [address]  Lactic acid, plasma     Status: None   Collection Time: 02/10/21 10:14 PM  Result Value Ref Range   Lactic Acid, Venous 1.1 0.5 - 1.9 mmol/L    Comment: Performed at [facility], [place] [address], [place], [place] [address]  Magnesium     Status: None   Collection Time: 02/10/21 10:14 PM  Result Value Ref Range   Magnesium 2.0 1.7 - 2.4 mg/dL    Comment: Performed at [facility], [place] [address], [place], [place] [address]  Lipid panel     Status: Abnormal   Collection Time: 02/10/21 10:14 PM  Result Value Ref Range   Cholesterol 279 (H) 0 - 200 mg/dL   Triglycerides 126 <150 mg/dL   HDL 75 >40 mg/dL   Total CHOL/HDL Ratio 3.7 RATIO   VLDL 25 0 - 40 mg/dL   LDL Cholesterol 179 (H) 0 - 99 mg/dL    Comment:        Total Cholesterol/HDL:CHD Risk Coronary Heart Disease Risk Table                     Men   Women  1/2 Average Risk   3.4   3.3  Average Risk       5.0   4.4  2 X Average Risk   9.6   7.1  3 X Average Risk  23.4   11.0         Use the calculated Patient Ratio above and the CHD Risk Table to determine the patient's CHD Risk.        ATP III CLASSIFICATION (LDL):  <100     mg/dL   Optimal  100-129  mg/dL   Near or Above                    Optimal  130-159  mg/dL   Borderline  160-189  mg/dL   High  >190     mg/dL   Very High Performed at [place] [address], [place], [place] [id number]   Basic metabolic panel     Status: Abnormal   Collection Time: 02/11/21  4:04 AM  Result Value Ref Range   Sodium 140 135 - 145 mmol/L   Potassium 3.9 3.5 - 5.1 mmol/L    Comment: DELTA CHECK NOTED   Chloride 108 98 - 111 mmol/L   CO2 24 22 - 32 mmol/L   Glucose, Bld 82 70 - 99 mg/dL    Comment: Glucose reference range applies only to samples taken after fasting for at least 8 hours.   BUN 19 8 - 23 mg/dL   Creatinine, Ser 1.91 (H) 0.61 - 1.24 mg/dL   Calcium 8.2 (L) 8.9 -  10.3 mg/dL   GFR, Estimated 34 (L) >60 mL/min    Comment: (NOTE) Calculated using the CKD-EPI Creatinine Equation (2021)    Anion gap 8 5 - 15    Comment: Performed at [facility], [place] [address], [place], [place] [address]    DG Chest 1 View  Result Date: 02/11/2021 CLINICAL DATA:  CHF EXAM: CHEST  1 VIEW COMPARISON:  Chest radiograph 04/10/2017 FINDINGS: The patient is mildly rotated to the right. The heart is mildly enlarged. Mediastinal contours are otherwise within normal limits, allowing for patient rotation. There is calcified atherosclerotic plaque of the aortic arch. There are small bilateral pleural effusions. Hazy right basilar and retrocardiac opacities likely reflects subsegmental atelectasis. The upper lungs are well aerated. There is no overt pulmonary edema. There is no pneumothorax. The bones are unremarkable. IMPRESSION: Mild cardiomegaly with small bilateral pleural effusions. Adjacent bibasilar opacities most likely reflect subsegmental atelectasis. Electronically Signed   By: Valetta Mole M.D.   On: 02/11/2021 08:12   CT ABDOMEN PELVIS W CONTRAST  Result Date: 02/10/2021 CLINICAL DATA:  Right lower quadrant pain EXAM: CT ABDOMEN AND PELVIS WITH CONTRAST TECHNIQUE: Multidetector CT imaging of the abdomen and pelvis was performed using the standard protocol following bolus administration of intravenous contrast. CONTRAST:  63m OMNIPAQUE IOHEXOL 350 MG/ML SOLN COMPARISON:  CT abdomen/pelvis 11/26/2016 FINDINGS: Lower chest: There are bilateral pleural effusions with adjacent subsegmental atelectasis. The imaged heart is unremarkable. There is no pericardial effusion. Hepatobiliary: Tiny hypodense lesions in the liver are too small to characterize, but likely reflects cysts. There are no suspicious focal lesions. The gallbladder is normal. There is no intra or extrahepatic biliary ductal dilatation. Pancreas: There are no focal lesions or contour abnormalities. There is no main pancreatic ductal dilatation or peripancreatic inflammatory change. Spleen: Unremarkable. Adrenals/Urinary Tract: The adrenals are unremarkable. There is cortical thinning of the left kidney likely reflecting scarring. Hypodense lesions in the left kidney are too small to characterize but likely reflects cysts, not significantly changed. There are no other focal lesions. There are no calculi. There is no hydronephrosis or hydroureter. The bladder is not well assessed due to decompression and streak artifact from right hip arthroplasty hardware. Stomach/Bowel: The stomach is unremarkable. There is no evidence of bowel obstruction. The appendix is unremarkable. There is no abnormal bowel wall thickening or inflammatory change. There are scattered colonic diverticuli without evidence of acute diverticulosis. Vascular/Lymphatic: There is calcified atherosclerotic plaque throughout the nonaneurysmal abdominal aorta and at the origins of the major branch vessels. The SMA, celiac axis, and IMA are patent. The main portal and  splenic veins are patent. There is no abdominal or pelvic lymphadenopathy. Reproductive: The prostate and seminal vesicles are grossly unremarkable but suboptimally assessed due to streak artifact. Other: There is scattered free fluid in the abdomen and pelvis. Fluid is also seen in the left inguinal canal. There is no free intraperitoneal air. There is mild diffuse body wall edema. Musculoskeletal: The patient is status post right hip arthroplasty. There is mild anterior wedge deformity of the L1 vertebral body, unchanged since 2018. There is multilevel degenerative change throughout the remainder of the lumbar spine. IMPRESSION: 1. Bilateral pleural effusions, mild diffuse body wall edema, and small volume ascites in the abdomen pelvis raises the possibility of fluid overload. 2. Otherwise, no acute findings in the abdomen or pelvis. Specifically, no evidence of appendicitis. 3. Scattered colonic diverticula. 4.  Aortic Atherosclerosis (ICD10-I70.0). Electronically Signed   By: PValetta Mole  M.D.   On: 02/10/2021 11:56   US Abdomen Limited  Result Date: 02/10/2021 CLINICAL DATA:  Right upper quadrant abdominal pain. EXAM: ULTRASOUND ABDOMEN LIMITED RIGHT UPPER QUADRANT COMPARISON:  CT abdomen pelvis 05/06/2017, CT abdomen pelvis 02/10/2021 FINDINGS: Gallbladder: Markedly limited evaluation due to overlying bowel gas. No definite gallstones or wall thickening visualized. No sonographic Murphy sign noted by sonographer. Common bile duct: Diameter: 6 mm. Liver: No focal lesion identified. Within normal limits in parenchymal echogenicity. Portal vein is patent on color Doppler imaging with normal direction of blood flow towards the liver. Other: Trace simple free fluid within right upper quadrant. IMPRESSION: 1. Markedly limited/nondiagnostic evaluation of the gallbladder due to overlying bowel gas. 2. Trace simple free fluid within the upper abdomen. Electronically Signed   By: Iven Finn M.D.   On:  02/10/2021 16:35    Review of Systems negative except above Blood pressure (!) 181/80, pulse (!) 103, temperature 98 F (36.7 C), temperature source Oral, resp. rate 11, height '5\' 2"'$  (1.575 m), weight 64.4 kg, SpO2 97 %. Physical Exam vital signs stable afebrile exam please see preassessment evaluation he is not as tender today as listed on admitting exam labs CT previous Endo all results reviewed  Assessment/Plan: Abdominal pain nausea vomiting history of ulcers Plan: The risk benefits methods of endoscopy was rediscussed with the patient through the translator and will proceed today with further work-up and plans pending those findings  [place] E 02/11/2021, 9:51 AM

## 2021-02-11 NOTE — Progress Notes (Signed)
PT Cancellation Note  Patient Details Name: Logan [name] MRN: [id number] DOB: 01-Jan-1935   Cancelled Treatment:    Reason Eval/Treat Not Completed: Other (comment) In am, pt off floor for EGD.  Checked in pm and RN reports just gave him dilaudid and he fell asleep.  Requested to let him rest as he had been in pain.  Will f/u at later date as able.  Abran Richard, PT Acute Rehab Services Pager [phone] [facility] [place] 02/11/2021, 3:39 PM

## 2021-02-11 NOTE — Transfer of Care (Signed)
Immediate Anesthesia Transfer of Care Note  Patient: Logan [name]  Procedure(s) Performed: ESOPHAGOGASTRODUODENOSCOPY (EGD) WITH PROPOFOL BIOPSY  Patient Location: PACU and Endoscopy Unit  Anesthesia Type:MAC  Level of Consciousness: awake and drowsy  Airway & Oxygen Therapy: Patient Spontanous Breathing  Post-op Assessment: Report given to RN and Post -op Vital signs reviewed and stable  Post vital signs: Reviewed and stable  Last Vitals:  Vitals Value Taken Time  BP    Temp    Pulse 89 02/11/21 1017  Resp 13 02/11/21 1017  SpO2 100 % 02/11/21 1017  Vitals shown include unvalidated device data.  Last Pain:  Vitals:   02/11/21 0913  TempSrc: Oral  PainSc: 0-No pain         Complications: No notable events documented.

## 2021-02-11 NOTE — Assessment & Plan Note (Addendum)
--   Acute on chronic, iron deficiency anemia component.  Hemoglobin down 2 g 8/18, appropriately increased with 2 units PRBC.  Bleeding scan was negative.  Follow clinically.

## 2021-02-11 NOTE — Hospital Course (Addendum)
86 year old man PMH gastric ulcer, GI bleed, chronic iron deficiency anemia presenting with epigastric abdominal pain, nausea, vomiting.  Admitted for further evaluation and GI consultation.  CT revealed no acute abnormalities.  Right upper quadrant ultrasound was unrevealing but limited secondary to overlying bowel gas.  EGD revealed LA grade a reflux esophagitis with no bleeding.  No reported bleeding, however hemoglobin dropped 2 g. Etiology unclear nuclear medicine bleeding scan was negative.  Hemoglobin appropriately increased with transfusion.  No bleeding reported.  Initially acute kidney injury suspected, but now probably represents CKD.  Abdominal pain essentially resolved at this point.  Plan for HIDA scan 8/22 and then likely discharge home if cleared by gastroenterology.

## 2021-02-11 NOTE — Assessment & Plan Note (Signed)
--  see AKI

## 2021-02-11 NOTE — Assessment & Plan Note (Addendum)
--   Resolved.  Tolerating diet.  Right upper quadrant ultrasound was unremarkable but limited secondary to overlying bowel gas.  CT abdomen/pelvis was unremarkable.  LFTs and lipase unremarkable.  Given history of gastric ulcer disease, seen by gastroenterology, EGD relatively unrevealing showing LA grade a esophagitis.  Continue PPI.  -- Plan for HIDA tomorrow and likely discharge home if cleared by gastroenterology.

## 2021-02-11 NOTE — Assessment & Plan Note (Addendum)
--   Blood pressure is more elevated now.  Increase hydralazine. Add amlodipine.  Continue carvedilol.

## 2021-02-11 NOTE — Assessment & Plan Note (Addendum)
--   Appears to have stabilized, may rather reflect CKD stage IIIb rather than acute kidney injury.  Management complicated by diuretic and contrast.   -- Baseline creatinine unclear but may be around 1.4 with last values in 2018.  At this point I suspect progression of CKD. -- No signs or symptoms of decompensated CHF. -- Some IV fluids today, repeat BMP in AM.

## 2021-02-12 ENCOUNTER — Inpatient Hospital Stay: Payer: Medicare Other | Admitting: Hematology and Oncology

## 2021-02-12 ENCOUNTER — Encounter (HOSPITAL_COMMUNITY): Payer: Self-pay | Admitting: Gastroenterology

## 2021-02-12 DIAGNOSIS — R809 Proteinuria, unspecified: Secondary | ICD-10-CM

## 2021-02-12 DIAGNOSIS — D649 Anemia, unspecified: Secondary | ICD-10-CM

## 2021-02-12 LAB — IRON AND TIBC
Iron: 8 ug/dL — ABNORMAL LOW (ref 45–182)
Saturation Ratios: 6 % — ABNORMAL LOW (ref 17.9–39.5)
TIBC: 144 ug/dL — ABNORMAL LOW (ref 250–450)
UIBC: 136 ug/dL

## 2021-02-12 LAB — URINALYSIS, ROUTINE W REFLEX MICROSCOPIC
Bacteria, UA: NONE SEEN
Bilirubin Urine: NEGATIVE
Glucose, UA: 50 mg/dL — AB
Ketones, ur: NEGATIVE mg/dL
Leukocytes,Ua: NEGATIVE
Nitrite: NEGATIVE
Protein, ur: >=300 mg/dL — AB
Specific Gravity, Urine: 1.025 (ref 1.005–1.030)
pH: 6 (ref 5.0–8.0)

## 2021-02-12 LAB — COMPREHENSIVE METABOLIC PANEL
ALT: 11 U/L (ref 0–44)
AST: 14 U/L — ABNORMAL LOW (ref 15–41)
Albumin: 3.1 g/dL — ABNORMAL LOW (ref 3.5–5.0)
Alkaline Phosphatase: 42 U/L (ref 38–126)
Anion gap: 7 (ref 5–15)
BUN: 25 mg/dL — ABNORMAL HIGH (ref 8–23)
CO2: 23 mmol/L (ref 22–32)
Calcium: 8.1 mg/dL — ABNORMAL LOW (ref 8.9–10.3)
Chloride: 111 mmol/L (ref 98–111)
Creatinine, Ser: 2.53 mg/dL — ABNORMAL HIGH (ref 0.61–1.24)
GFR, Estimated: 25 mL/min — ABNORMAL LOW (ref >60)
Glucose, Bld: 115 mg/dL — ABNORMAL HIGH (ref 70–99)
Potassium: 3.6 mmol/L (ref 3.5–5.1)
Sodium: 141 mmol/L (ref 135–145)
Total Bilirubin: 0.5 mg/dL (ref 0.3–1.2)
Total Protein: 4.9 g/dL — ABNORMAL LOW (ref 6.5–8.1)

## 2021-02-12 LAB — CBC
HCT: 22 % — ABNORMAL LOW (ref 39.0–52.0)
Hemoglobin: 6.4 g/dL — CL (ref 13.0–17.0)
MCH: 20.6 pg — ABNORMAL LOW (ref 26.0–34.0)
MCHC: 29.1 g/dL — ABNORMAL LOW (ref 30.0–36.0)
MCV: 70.7 fL — ABNORMAL LOW (ref 80.0–100.0)
Platelets: 275 10*3/uL (ref 150–400)
RBC: 3.11 MIL/uL — ABNORMAL LOW (ref 4.22–5.81)
RDW: 19.9 % — ABNORMAL HIGH (ref 11.5–15.5)
WBC: 8.4 10*3/uL (ref 4.0–10.5)
nRBC: 0 % (ref 0.0–0.2)

## 2021-02-12 LAB — PREPARE RBC (CROSSMATCH)

## 2021-02-12 LAB — FOLATE: Folate: 6.1 ng/mL (ref >5.9)

## 2021-02-12 LAB — VITAMIN B12: Vitamin B-12: 263 pg/mL (ref 180–914)

## 2021-02-12 LAB — RETICULOCYTES
Immature Retic Fract: 12.2 % (ref 2.3–15.9)
RBC.: 2.81 MIL/uL — ABNORMAL LOW (ref 4.22–5.81)
Retic Count, Absolute: 23.3 10*3/uL (ref 19.0–186.0)
Retic Ct Pct: 0.8 % (ref 0.4–3.1)

## 2021-02-12 LAB — FERRITIN: Ferritin: 6 ng/mL — ABNORMAL LOW (ref 24–336)

## 2021-02-12 MED ORDER — SODIUM CHLORIDE 0.9% IV SOLUTION: Freq: Once | INTRAVENOUS | Status: AC

## 2021-02-12 MED ORDER — SODIUM CHLORIDE 0.9 % IV SOLN: INTRAVENOUS | Status: DC

## 2021-02-12 NOTE — Progress Notes (Signed)
CRITICAL VALUE STICKER  CRITICAL VALUE: Hg 6.4  RECEIVER (on-site recipient of call): Clovia Cuff RN  DATE & TIME NOTIFIED: 02/12/21 1335  MESSENGER (representative from lab):  MD NOTIFIED: Dr. Sarajane Jews  TIME OF NOTIFICATION: 02/12/21 1340  RESPONSE:

## 2021-02-12 NOTE — Assessment & Plan Note (Signed)
--  close outpatient follow-up

## 2021-02-12 NOTE — Assessment & Plan Note (Deleted)
Lab review 02/12/2021: Serum iron 8, TIBC 144, iron saturation 6%, ferritin: 6 creatinine 2.53, hemoglobin 8.4, MCV 70.9, B12 263, folate 6.1, reticulocyte count absolute 23.3

## 2021-02-12 NOTE — Evaluation (Signed)
Physical Therapy Evaluation Patient Details Name: Xzaveon Weese MRN: [id number] DOB: [birth year] Today's Date: 02/12/2021   History of Present Illness  86 year old man PMH gastric ulcer, GI bleed, chronic iron deficiency anemia , CVA,presenting with epigastric abdominal pain, nausea, vomiting, FTT, on 8/16. Admitted for CHF, acute on Chronic Kidney injury. EGD revealed  reflux esophagitis with no bleeding  Clinical Impression  Interpreter ipad utilized for communication. Patient able to follow for simple questions and directions.  Patient required much encouragement to mobilize  to sitting then to recliner. Patient required mod assistance to sit upright, indicating right side pain. Mod assistance to stand and steady patient with a " bear hug-like" technique to rise. Patient able to take a few steps to recliner with Therapist supporting and providing  visual cues. Patient sat in recliner x ~ 10 minutes then indicated  that he wanted to return to bed. Mod assistance to stand from recliner and step back to bed.as performed previously.  Per Ed notes, family indicated that patient had low activity prior to admission.  Patient currently is very weak. Currently will require 1:1 assistance 24/7 caregivers if [place] to home.  Unsure of DME and house arrangement. Patient unable to provide. Pt admitted with above diagnosis.  Pt currently with functional limitations due to the deficits listed below (see PT Problem List). Pt will benefit from skilled PT to increase their independence and safety with mobility to allow discharge to the venue listed below.       Follow Up Recommendations Home health PT;Supervision/Assistance - 24 hour    Equipment Recommendations   (TBD)    Recommendations for Other Services   OT    Precautions / Restrictions Precautions Precautions: Fall Precaution Comments: use interpreter-Gujarati      Mobility  Bed Mobility Overal bed mobility: Needs Assistance Bed Mobility:  Rolling;Sidelying to Sit;Sit to Sidelying;Sit to Supine Rolling: Supervision Sidelying to sit: Mod assist   Sit to supine: Min assist   General bed mobility comments: assist trunk to sit upright,   with extra time, able to place feet onto bed. rolls self independently    Transfers Overall transfer level: Needs assistance   Transfers: Sit to/from Stand;Stand Pivot Transfers Sit to Stand: Mod assist Stand pivot transfers: Mod assist       General transfer comment: mod assist to power up from bed and recliner, multimodal cues to  step to recliner and back to bed,  Ambulation/Gait             General Gait Details: TBA  Stairs            Wheelchair Mobility    Modified Rankin (Stroke Patients Only)       Balance Overall balance assessment: Needs assistance Sitting-balance support: Bilateral upper extremity supported;Feet supported Sitting balance-Leahy Scale: Fair     Standing balance support: Single extremity supported Standing balance-Leahy Scale: Poor Standing balance comment: support wjen standing                             Pertinent Vitals/Pain Pain Assessment: Faces Faces Pain Scale: Hurts even more Pain Location: grabs right side when trying to sit up Pain Descriptors / Indicators: Discomfort;Jabbing;Guarding Pain Intervention(s): Monitored during session;Limited activity within patient's tolerance    Home Living Family/patient expects to be discharged to:: Private residence Living Arrangements: Children Available Help at Discharge: Family;Available PRN/intermittently Type of Home: House     Entrance Stairs-Number of Steps: unsure Home Layout: One  level   Additional Comments: unsure, previous encounter  states standard Walker    Prior Function           Comments: patient unable to provide details     Hand Dominance   Dominant Hand: Right    Extremity/Trunk Assessment   Upper Extremity Assessment Upper Extremity  Assessment: Generalized weakness    Lower Extremity Assessment Lower Extremity Assessment: Generalized weakness    Cervical / Trunk Assessment Cervical / Trunk Assessment: Normal  Communication   Communication: Interpreter utilized ([id number])  Cognition Arousal/Alertness: Awake/alert Behavior During Therapy: WFL for tasks assessed/performed Overall Cognitive Status: No family/caregiver present to determine baseline cognitive functioning Area of Impairment: Safety/judgement                         Safety/Judgement: Decreased awareness of deficits     General Comments: Answered interpreter and followed simple directions. Able to jester that he wanted to return to bed, started to get up from recliner with leg rests still up.      General Comments      Exercises     Assessment/Plan    PT Assessment Patient needs continued PT services  PT Problem List Decreased strength;Decreased balance;Decreased cognition;Decreased knowledge of precautions;Decreased mobility;Decreased knowledge of use of DME;Decreased activity tolerance;Pain       PT Treatment Interventions DME instruction;Therapeutic activities;Cognitive remediation;Gait training;Therapeutic exercise;Patient/family education;Functional mobility training    PT Goals (Current goals can be found in the Care Plan section)  Acute Rehab PT Goals Patient Stated Goal: indicates to get back into bed PT Goal Formulation: Patient unable to participate in goal setting Time For Goal Achievement: 02/26/21 Potential to Achieve Goals: Good    Frequency Min 3X/week   Barriers to discharge        Co-evaluation               AM-PAC PT "6 Clicks" Mobility  Outcome Measure Help needed turning from your back to your side while in a flat bed without using bedrails?: A Little Help needed moving from lying on your back to sitting on the side of a flat bed without using bedrails?: A Little Help needed moving to and from a  bed to a chair (including a wheelchair)?: A Lot Help needed standing up from a chair using your arms (e.g., wheelchair or bedside chair)?: A Lot Help needed to walk in hospital room?: A Lot Help needed climbing 3-5 steps with a railing? : Total 6 Click Score: 13    End of Session   Activity Tolerance: Patient limited by fatigue Patient left: in bed;with call bell/phone within reach;with nursing/sitter in room;with bed alarm set Nurse Communication: Mobility status PT Visit Diagnosis: Unsteadiness on feet (R26.81);Muscle weakness (generalized) (M62.81);Difficulty in walking, not elsewhere classified (R26.2);Pain    Time: 1112-1150 PT Time Calculation (min) (ACUTE ONLY): 38 min   Charges:   PT Evaluation $PT Eval Low Complexity: 1 Low PT Treatments $Therapeutic Activity: 8-22 mins $Self Care/Home Management: [place] Pager [phone] Office [phone]   Claretha Cooper 02/12/2021, 1:28 PM

## 2021-02-12 NOTE — Progress Notes (Signed)
PROGRESS NOTE  Logan [name] [id number] DOB: [birth year] DOA: 02/10/2021 PCP: Jilda Panda, MD  Brief History   86 year old man PMH gastric ulcer, GI bleed, chronic iron deficiency anemia presenting with epigastric abdominal pain, nausea, vomiting.  Admitted for further evaluation and GI consultation.  Imaging revealed no acute abnormalities clear CT.  Right upper quadrant ultrasound was unrevealing but limited secondary to overlying bowel gas.  EGD revealed LA grade a reflux esophagitis with no bleeding.  No reported bleeding, however hemoglobin down 2 g today, confirmed.  Exam unchanged, continues to have right upper quadrant pain.  Initially planned for HIDA, however overall pursue bleeding scan not emergently.   A & P  * Intractable abdominal pain -- Etiology remains unclear, persists, present for about 6 days.  Right upper quadrant ultrasound was unremarkable but limited secondary to overlying bowel gas.  CT abdomen/pelvis was unremarkable.  Given history of gastric ulcer disease, seen by gastroenterology, EGD relatively unrevealing showing LA grade a esophagitis.  Continue PPI.  -- Exam remains benign.  Neck step would be HIDA scan, however first we will work-up anemia.  Anemia -- Acute on chronic, iron deficiency anemia component.  Hemoglobin down 2 g today, confirmed with redraw.  Discussed with patient and son, okay to transfuse.  We will give 1 unit PRBC and keep 1 on hold.  Follow-up CBC thereafter. -- No signs or symptoms of bleeding.  Will monitor.  RBC tagged study for bleeding or in AM.  Discussed with gastroenterology.  AKI (acute kidney injury) ([place]) -- Etiology unclear, worse today, may be secondary to contrast and diuretic.  Possibly superimposed on CKD stage IIIa.   -- Baseline creatinine unclear but may be around 1.4 with last values in 2018. -- Difficult to assess given body wall edema seen on CT which may be from hypoalbuminemia.  Patient has no signs or symptoms to  suggest decompensated CHF.  Possible renal function is at baseline. -- IV fluids, avoid any nephrotoxins, recheck in a.m.  Proteinuria --close outpatient follow-up  CKD (chronic kidney disease), stage III ([place]) --see AKI  Benign essential HTN -- Uncontrolled on admission.  Now better controlled.  Chronic diastolic CHF (congestive heart failure) (HCC) -- Apparently with acute component on admission, I do not see any evidence of decompensation at this point. -- Continue carvedilol, hydralazine.  Apparently not on a diuretic as an outpatient.  Follow volume status clinically.  Disposition Plan:  Discussion: Difficult case given language barrier as the patient is hard of hearing.  However continues to have right-sided abdominal pain.  We will first work-up anemia, thereafter will proceed with HIDA scan.  Status is: Inpatient  Remains inpatient appropriate because:Inpatient level of care appropriate due to severity of illness  Dispo: The patient is from: Home              Anticipated d/c is to: Home              Patient currently is not medically stable to d/c.   Difficult to place patient No  DVT prophylaxis: SCDs Start: 02/10/21 1548   Code Status: Full Code Level of care: Telemetry Family Communication: son by telephone  Murray Hodgkins, MD  [facility] Direct contact: see [email or web address] (further directions at bottom of note if needed) 7PM-7AM contact night coverage as at bottom of note 02/12/2021, 5:04 PM  LOS: 2 days   Significant Hospital Events      Consults:  GI   Procedures:  EGD LA grade A esophagitis  Interval History/Subjective  CC: f/u abd pain  Patient seen with interpreter, reports right-sided abdominal pain.  Present about 6 days.  Hard of hearing, difficult to interview.  Objective   Vitals:  Vitals:   02/12/21 0540 02/12/21 1506  BP: (!) 154/80 (!) 159/83  Pulse: 85 95  Resp: 18 16  Temp: 98.2 F (36.8 C) 98 F (36.7 C)  SpO2: 99% 100%     Exam: Physical Exam Vitals reviewed.  Constitutional:      General: He is not in acute distress.    Appearance: He is not ill-appearing or toxic-appearing.  Cardiovascular:     Rate and Rhythm: Normal rate and regular rhythm.     Heart sounds: No murmur heard. Pulmonary:     Effort: Pulmonary effort is normal. No respiratory distress.     Breath sounds: Normal breath sounds. No wheezing or rales.  Abdominal:     General: There is no distension.     Palpations: There is no mass.     Tenderness: There is abdominal tenderness (RUQ moderate).  Musculoskeletal:     Right lower leg: No edema.     Left lower leg: No edema.  Neurological:     Mental Status: He is alert.  Psychiatric:        Behavior: Behavior normal.   I have personally reviewed the labs and other data, making special note of:   Today's Data  Creatinine up to 2.53 Hgb down to 6.4 (confirmed) Ferritin 6  Scheduled Meds:  sodium chloride   Intravenous Once   atorvastatin  20 mg Oral Daily   carvedilol  12.5 mg Oral BID   feeding supplement  237 mL Oral TID BM   hydrALAZINE  25 mg Oral Q6H   lidocaine  1 patch Transdermal Q24H   metoCLOPramide  5 mg Oral QID   multivitamin with minerals  1 tablet Oral Daily   pantoprazole (PROTONIX) IV  40 mg Intravenous Q12H   Continuous Infusions:  sodium chloride 100 mL/hr at 02/12/21 1533   albumin human 25 g (02/12/21 1549)    Principal Problem:   Intractable abdominal pain Active Problems:   AKI (acute kidney injury) ([place])   Anemia   CKD (chronic kidney disease), stage III (HCC)   Proteinuria   Chronic diastolic CHF (congestive heart failure) ([place])   Benign essential HTN   LOS: 2 days   How to contact the [facility] Attending or Consulting provider 7A - 7P or covering provider during after hours [place], for this patient?  Check the care team in [facility] and look for a) attending/consulting TR[facility] provider listed and b) the [facility] team listed Log into [email or web address] and use  [place]'s universal password to access. If you do not have the password, please contact the hospital operator. Locate the [facility] provider you are looking for under [facility] and page to a number that you can be directly reached. If you still have difficulty reaching the provider, please page the [facility] (Director on Call) for the Hospitalists listed on amion for assistance.

## 2021-02-12 NOTE — Progress Notes (Addendum)
Patient asleep did not wake up no family member or translator available slight worsening BUN and creatinine mixed picture on iron studies of chronic disease versus iron deficiency would not put him through a colonoscopy at this time if still with right upper quadrant pain recommend CCK HIDA scan next and please call us if we can be of any further assistance with this hospital stay otherwise happy to see back as an outpatient with a translator in the future and will await biopsies as well        as an addendum I was called by the hospital team to discuss his follow-up hemoglobin which showed a significant drop without obvious bleeding and will proceed with nuclear bleeding scan to make sure no acute process although without increased bowel movements or signs of GI blood loss it is hard to blame a GI source but will wait on above

## 2021-02-13 ENCOUNTER — Inpatient Hospital Stay (HOSPITAL_COMMUNITY): Payer: 59

## 2021-02-13 LAB — COMPREHENSIVE METABOLIC PANEL
ALT: 13 U/L (ref 0–44)
AST: 21 U/L (ref 15–41)
Albumin: 3.4 g/dL — ABNORMAL LOW (ref 3.5–5.0)
Alkaline Phosphatase: 37 U/L — ABNORMAL LOW (ref 38–126)
Anion gap: 6 (ref 5–15)
BUN: 23 mg/dL (ref 8–23)
CO2: 24 mmol/L (ref 22–32)
Calcium: 8.1 mg/dL — ABNORMAL LOW (ref 8.9–10.3)
Chloride: 113 mmol/L — ABNORMAL HIGH (ref 98–111)
Creatinine, Ser: 2.38 mg/dL — ABNORMAL HIGH (ref 0.61–1.24)
GFR, Estimated: 26 mL/min — ABNORMAL LOW (ref >60)
Glucose, Bld: 92 mg/dL (ref 70–99)
Potassium: 3.6 mmol/L (ref 3.5–5.1)
Sodium: 143 mmol/L (ref 135–145)
Total Bilirubin: 0.7 mg/dL (ref 0.3–1.2)
Total Protein: 5.1 g/dL — ABNORMAL LOW (ref 6.5–8.1)

## 2021-02-13 LAB — CBC
HCT: 23.6 % — ABNORMAL LOW (ref 39.0–52.0)
Hemoglobin: 7.2 g/dL — ABNORMAL LOW (ref 13.0–17.0)
MCH: 23.1 pg — ABNORMAL LOW (ref 26.0–34.0)
MCHC: 30.5 g/dL (ref 30.0–36.0)
MCV: 75.6 fL — ABNORMAL LOW (ref 80.0–100.0)
Platelets: 280 10*3/uL (ref 150–400)
RBC: 3.12 MIL/uL — ABNORMAL LOW (ref 4.22–5.81)
RDW: 20.1 % — ABNORMAL HIGH (ref 11.5–15.5)
WBC: 8.1 10*3/uL (ref 4.0–10.5)
nRBC: 0 % (ref 0.0–0.2)

## 2021-02-13 LAB — HEMOGLOBIN AND HEMATOCRIT, BLOOD
HCT: 23.5 % — ABNORMAL LOW (ref 39.0–52.0)
Hemoglobin: 7.1 g/dL — ABNORMAL LOW (ref 13.0–17.0)

## 2021-02-13 LAB — LIPASE, BLOOD: Lipase: 31 U/L (ref 11–51)

## 2021-02-13 MED ORDER — TECHNETIUM TC 99M-LABELED RED BLOOD CELLS IV KIT: 23.4000 | PACK | Freq: Once | INTRAVENOUS | Status: DC

## 2021-02-13 MED ORDER — SODIUM CHLORIDE 0.9% IV SOLUTION: Freq: Once | INTRAVENOUS | Status: AC

## 2021-02-13 NOTE — Care Management Important Message (Signed)
Important Message  Patient Details IM Letter placed in Patient's room. Name: Logan [name] MRN: [id number] Date of Birth: Nov 20, 1934   Medicare Important Message Given:  Yes     Kerin Salen 02/13/2021, 10:59 AM

## 2021-02-13 NOTE — Evaluation (Signed)
Occupational Therapy Evaluation Patient Details Name: Logan [name] MRN: [id number] DOB: [birth year] Today's Date: 02/13/2021    History of Present Illness 86 year old man PMH gastric ulcer, GI bleed, chronic iron deficiency anemia , CVA,presenting with epigastric abdominal pain, nausea, vomiting, FTT, on 8/16. Admitted for CHF, acute on Chronic Kidney injury. EGD revealed  reflux esophagitis with no bleeding   Clinical Impression   Logan [name] is an 86 year old man who presents to hospital with abdominal pain. On evaluation demonstrates ability to perform UB ADLs with setup, min-mod assist for LB ADLs and min assist for ambulation with RW. Patient dragging RLE behind him. Per chart review - patient's gait similar to how he performed in 2018.  Per chart patient limited ambulation to approx 10 feet even then. Communication limited due to language barrier and patient unable to hear iPad intepreter. Use of gestures and simple language sufficed. Patient appears to be near his baseline. OT will follow patient acutely to make sure he maintains his current abilities and participates in ADLs while in hospital.     Follow Up Recommendations  No OT follow up    Equipment Recommendations  None recommended by OT    Recommendations for Other Services       Precautions / Restrictions Precautions Precautions: Fall Precaution Comments: use interpreter-Gujarati Restrictions Weight Bearing Restrictions: No      Mobility Bed Mobility Overal bed mobility: Needs Assistance Bed Mobility: Supine to Sit;Sit to Supine     Supine to sit: Mod assist Sit to supine: Mod assist        Transfers Overall transfer level: Needs assistance Equipment used: Rolling walker (2 wheeled) Transfers: Sit to/from Stand Sit to Stand: Min assist         General transfer comment: Min assist to stand, min assist to ambulate in room with RW with abnormal gait - dragging right foot.    Balance Overall  balance assessment: Needs assistance Sitting-balance support: No upper extremity supported Sitting balance-Leahy Scale: Good     Standing balance support: During functional activity Standing balance-Leahy Scale: Poor                             ADL either performed or assessed with clinical judgement   ADL Overall ADL's : Needs assistance/impaired Eating/Feeding: Independent   Grooming: Set up;Wash/dry face   Upper Body Bathing: Set up;Sitting   Lower Body Bathing: Minimal assistance;Sit to/from stand   Upper Body Dressing : Set up;Sitting   Lower Body Dressing: Sit to/from stand;Moderate assistance   Toilet Transfer: Minimal assistance;RW;Regular Toilet;BSC   Toileting- Clothing Manipulation and Hygiene: Set up;Sit to/from stand Toileting - Clothing Manipulation Details (indicate cue type and reason): to use urinal     Functional mobility during ADLs: Rolling walker;Minimal assistance       Vision   Vision Assessment?: No apparent visual deficits     Perception     Praxis      Pertinent Vitals/Pain Pain Assessment: Faces Pain Score: 0-No pain     Hand Dominance Right   Extremity/Trunk Assessment Upper Extremity Assessment Upper Extremity Assessment: Overall WFL for tasks assessed   Lower Extremity Assessment Lower Extremity Assessment: Defer to PT evaluation (Chronic impairments of RLE)   Cervical / Trunk Assessment Cervical / Trunk Assessment: Normal   Communication Communication Communication: Prefers language other than English (363-46)   Cognition Arousal/Alertness: Awake/alert Behavior During Therapy: WFL for tasks assessed/performed Overall Cognitive Status: Within Functional  Limits for tasks assessed                                     General Comments       Exercises     Shoulder Instructions      Home Living Family/patient expects to be discharged to:: Private residence Living Arrangements:  Children Available Help at Discharge: Family;Available PRN/intermittently Type of Home: House   Entrance Stairs-Number of Steps: unsure   Home Layout: One level     Bathroom Shower/Tub: Tub/shower unit             Additional Comments: unsure, previous encounter  states standard Walker      Prior Functioning/Environment          Comments: patient unable to provide details. Expect patient had assistance with ADLs and IADLs.        OT Problem List: Decreased activity tolerance;Impaired balance (sitting and/or standing);Pain;Decreased safety awareness      OT Treatment/Interventions: Self-care/ADL training;DME and/or AE instruction;Therapeutic activities;Balance training;Patient/family education    OT Goals(Current goals can be found in the care plan section) Acute Rehab OT Goals OT Goal Formulation: Patient unable to participate in goal setting Time For Goal Achievement: 02/27/21 Potential to Achieve Goals: Good  OT Frequency: Min 2X/week   Barriers to D/C:            Co-evaluation              AM-PAC OT "6 Clicks" Daily Activity     Outcome Measure Help from another person eating meals?: None Help from another person taking care of personal grooming?: A Little Help from another person toileting, which includes using toliet, bedpan, or urinal?: A Little Help from another person bathing (including washing, rinsing, drying)?: A Little Help from another person to put on and taking off regular upper body clothing?: A Little Help from another person to put on and taking off regular lower body clothing?: A Lot 6 Click Score: 18   End of Session Equipment Utilized During Treatment: Rolling walker Nurse Communication: Mobility status  Activity Tolerance: Patient tolerated treatment well Patient left: in bed;with call bell/phone within reach;with bed alarm set  OT Visit Diagnosis: Other abnormalities of gait and mobility (R26.89);Pain                Time:  1240-1314 OT Time Calculation (min): 34 min Charges:  OT General Charges $OT Visit: 1 Visit OT Evaluation $OT Eval Low Complexity: 1 Low OT Treatments $Self Care/Home Management : 8-22 mins  Rumi Kolodziej, OTR/L [place] [phone] Pager: [phone]   Lenward Chancellor 02/13/2021, 2:05 PM

## 2021-02-13 NOTE — Progress Notes (Signed)
Logan [name] 12:06 PM  Subjective: Patient actually without any obvious complaints and his case discussed with the nuclear medicine people who said after his bowel movement he did not have any more pain and his bowel movement was small with formed dark stool but no active bleeding and his bleeding scan was negative and they also said that he would have to wait until Monday for a HIDA scan if we want to continue work-up as originally discussed  Objective: Vital signs stable afebrile no acute distress today abdomen is soft nontender hemoglobin now seemingly stable BUN and creatinine a little improved nuclear bleeding scan reported to me as negative not yet on the computer  Assessment: Right upper quadrant abdominal pain questionable etiology as well as anemia questionable etiology  Plan: Await Endo biopsies consider HIDA scan on Monday as above with ejection fraction if right upper quadrant pain continuing and we will asked my partner to check on this weekend and see if further GI work-up is needed and please call us sooner if signs of active bleeding however if it occurs in the next 12 to 24 hours you can reimage him at nuclear medicine without injection to see if that will help with the bleeding site  [facility]  office [phone] After 5PM or if no answer call [phone]

## 2021-02-13 NOTE — Progress Notes (Addendum)
PROGRESS NOTE  Logan [name] [id number] DOB: [birth year] DOA: 02/10/2021 PCP: Jilda Panda, MD  Brief History   86 year old man PMH gastric ulcer, GI bleed, chronic iron deficiency anemia presenting with epigastric abdominal pain, nausea, vomiting.  Admitted for further evaluation and GI consultation.  Imaging revealed no acute abnormalities clear CT.  Right upper quadrant ultrasound was unrevealing but limited secondary to overlying bowel gas.  EGD revealed LA grade a reflux esophagitis with no bleeding.  No reported bleeding, however hemoglobin dropped 2 g. Etiology unclear nuclear medicine bleeding scan was negative.  GI following.  Abdominal pain seems essentially resolved at this point we will continue to monitor, if recurs will proceed with HIDA Monday.  If condition continues to improve he may be able to discharge in the next 3 days pending improvement in renal function and stability of blood count.  A & P  * Intractable abdominal pain -- Essentially resolved at this point apparently after bowel movement.  Right upper quadrant ultrasound was unremarkable but limited secondary to overlying bowel gas.  CT abdomen/pelvis was unremarkable.  LFTs and lipase unremarkable.  Given history of gastric ulcer disease, seen by gastroenterology, EGD relatively unrevealing showing LA grade a esophagitis.  Continue PPI.  --At this point will monitor with supportive care, if recurs, would proceed with HIDA  Anemia -- Acute on chronic, iron deficiency anemia component.  Hemoglobin down 2 g 8/18, appropriately increased with 1 unit PRBC but still borderline.  Will give another unit PRBC today.  No bleeding identified, nuclear medicine bleeding scan was negative.  Etiology unclear, follow CBC.  AKI (acute kidney injury) ([place]) -- Slightly better today, suspect multifactorial including contrast and diuretic, possibly superimposed on CKD stage IIIa. -- Baseline creatinine unclear but may be around 1.4 with last  values in 2018. -- Difficult to assess given body wall edema seen on CT which may be from hypoalbuminemia.  Patient has no signs or symptoms to suggest decompensated CHF.   -- 1 unit PRBC and recheck BMP in AM  Proteinuria --close outpatient follow-up  CKD (chronic kidney disease), stage III ([place]) --see AKI  Benign essential HTN -- Uncontrolled on admission.  Now better controlled.  Chronic diastolic CHF (congestive heart failure) (HCC) -- Apparently with acute component on admission, I do not see any evidence of decompensation at this point. -- Continue carvedilol, hydralazine.  Apparently not on a diuretic as an outpatient.  Follow volume status clinically.  Disposition Plan:  Discussion: Difficult case given language barrier as the patient is hard of hearing.  However continues to have right-sided abdominal pain.  We will first work-up anemia, thereafter will proceed with HIDA scan.  Status is: Inpatient  Remains inpatient appropriate because:Inpatient level of care appropriate due to severity of illness  Dispo: The patient is from: Home              Anticipated d/c is to: Home              Patient currently is not medically stable to d/c.   Difficult to place patient No  DVT prophylaxis: SCDs Start: 02/10/21 1548   Code Status: Full Code Level of care: Telemetry Family Communication: son by telephone 8/19  Murray Hodgkins, MD  [facility] Direct contact: see [email or web address] (further directions at bottom of note if needed) 7PM-7AM contact night coverage as at bottom of note 02/13/2021, 3:42 PM  LOS: 3 days   Significant Hospital Events      Consults:  GI   Procedures:  EGD LA grade A esophagitis   Interval History/Subjective  CC: f/u abd pain  Seen with interpreter, patient feels well today, he reports very little central abdominal pain periumbilical in nature.  Overall feels much better.  No right upper quadrant pain.  Otherwise feels well.  Objective    Vitals:  Vitals:   02/13/21 0545 02/13/21 1249  BP: (!) 162/102 (!) 162/96  Pulse: 85 91  Resp: 16 18  Temp: 98 F (36.7 C) 98.3 F (36.8 C)  SpO2: 95% 100%    Exam: Physical Exam Vitals reviewed.  Constitutional:      General: He is not in acute distress.    Appearance: He is not ill-appearing or toxic-appearing.  Cardiovascular:     Rate and Rhythm: Normal rate and regular rhythm.     Heart sounds: No murmur heard. Pulmonary:     Effort: Pulmonary effort is normal. No respiratory distress.     Breath sounds: Normal breath sounds. No wheezing or rales.  Abdominal:     General: There is no distension.     Tenderness: There is abdominal tenderness (Minimal periumbilical pain.). There is no guarding.  Neurological:     Mental Status: He is alert.  Psychiatric:        Mood and Affect: Mood normal.        Behavior: Behavior normal.   I have personally reviewed the labs and other data, making special note of:   Today's Data  Hemoglobin up to 7.2 after 1 unit PRBC Creatinine slightly improved 2.38  Scheduled Meds:  sodium chloride   Intravenous Once   atorvastatin  20 mg Oral Daily   carvedilol  12.5 mg Oral BID   feeding supplement  237 mL Oral TID BM   hydrALAZINE  25 mg Oral Q6H   lidocaine  1 patch Transdermal Q24H   metoCLOPramide  5 mg Oral QID   multivitamin with minerals  1 tablet Oral Daily   pantoprazole (PROTONIX) IV  40 mg Intravenous Q12H   technetium labeled red blood cells  0000000 millicurie Intravenous Once   Continuous Infusions:    Principal Problem:   Intractable abdominal pain Active Problems:   AKI (acute kidney injury) (HCC)   Anemia   CKD (chronic kidney disease), stage III (HCC)   Proteinuria   Chronic diastolic CHF (congestive heart failure) ([place])   Benign essential HTN   LOS: 3 days   How to contact the [facility] Attending or Consulting provider 7A - 7P or covering provider during after hours [place], for this patient?  Check the care  team in [facility] and look for a) attending/consulting TRH provider listed and b) the [facility] team listed Log into [email or web address] and use [place]'s universal password to access. If you do not have the password, please contact the hospital operator. Locate the [facility] provider you are looking for under [facility] and page to a number that you can be directly reached. If you still have difficulty reaching the provider, please page the [facility] (Director on Call) for the Hospitalists listed on amion for assistance.

## 2021-02-14 LAB — BASIC METABOLIC PANEL
Anion gap: 7 (ref 5–15)
BUN: 22 mg/dL (ref 8–23)
CO2: 21 mmol/L — ABNORMAL LOW (ref 22–32)
Calcium: 8 mg/dL — ABNORMAL LOW (ref 8.9–10.3)
Chloride: 112 mmol/L — ABNORMAL HIGH (ref 98–111)
Creatinine, Ser: 2.15 mg/dL — ABNORMAL HIGH (ref 0.61–1.24)
GFR, Estimated: 30 mL/min — ABNORMAL LOW (ref >60)
Glucose, Bld: 96 mg/dL (ref 70–99)
Potassium: 3.6 mmol/L (ref 3.5–5.1)
Sodium: 140 mmol/L (ref 135–145)

## 2021-02-14 LAB — CBC
HCT: 31 % — ABNORMAL LOW (ref 39.0–52.0)
Hemoglobin: 9.8 g/dL — ABNORMAL LOW (ref 13.0–17.0)
MCH: 24 pg — ABNORMAL LOW (ref 26.0–34.0)
MCHC: 31.6 g/dL (ref 30.0–36.0)
MCV: 75.8 fL — ABNORMAL LOW (ref 80.0–100.0)
Platelets: 284 10*3/uL (ref 150–400)
RBC: 4.09 MIL/uL — ABNORMAL LOW (ref 4.22–5.81)
RDW: 20 % — ABNORMAL HIGH (ref 11.5–15.5)
WBC: 8.9 10*3/uL (ref 4.0–10.5)
nRBC: 0 % (ref 0.0–0.2)

## 2021-02-14 MED ORDER — FUROSEMIDE 10 MG/ML IJ SOLN
40.0000 mg | Freq: Once | INTRAMUSCULAR | Status: AC
  Administered 2021-02-14: 40 mg via INTRAVENOUS
  Filled 2021-02-14: qty 4

## 2021-02-14 MED ORDER — AMLODIPINE BESYLATE 5 MG PO TABS
5.0000 mg | ORAL_TABLET | Freq: Every day | ORAL | Status: DC
  Administered 2021-02-14 – 2021-02-16 (×3): 5 mg via ORAL
  Filled 2021-02-14 (×3): qty 1

## 2021-02-14 MED ORDER — HYDRALAZINE HCL 50 MG PO TABS
50.0000 mg | ORAL_TABLET | Freq: Four times a day (QID) | ORAL | Status: DC
  Administered 2021-02-14 – 2021-02-16 (×9): 50 mg via ORAL
  Filled 2021-02-14 (×9): qty 1

## 2021-02-14 MED ORDER — SODIUM CHLORIDE 0.9 % IV SOLN: INTRAVENOUS | Status: AC

## 2021-02-14 NOTE — Progress Notes (Signed)
PROGRESS NOTE  Logan [name] [id number] DOB: May 15, 1935 DOA: 02/10/2021 PCP: Jilda Panda, MD  Brief History   86 year old man PMH gastric ulcer, GI bleed, chronic iron deficiency anemia presenting with epigastric abdominal pain, nausea, vomiting.  Admitted for further evaluation and GI consultation.  Imaging revealed no acute abnormalities clear CT.  Right upper quadrant ultrasound was unrevealing but limited secondary to overlying bowel gas.  EGD revealed LA grade a reflux esophagitis with no bleeding.  No reported bleeding, however hemoglobin dropped 2 g. Etiology unclear nuclear medicine bleeding scan was negative.  Hemoglobin appropriately increased to transfusion.  No bleeding reported.  We will continue to monitor hemoglobin, consider inpatient versus outpatient HIDA based on clinical course.  A & P  * Intractable abdominal pain -- Appears to be resolved at this point.  Right upper quadrant ultrasound was unremarkable but limited secondary to overlying bowel gas.  CT abdomen/pelvis was unremarkable.  LFTs and lipase unremarkable.  Given history of gastric ulcer disease, seen by gastroenterology, EGD relatively unrevealing showing LA grade a esophagitis.  Continue PPI.  -- Assess tolerance to diet, clinical course, consider inpatient versus outpatient HIDA next week.  Anemia -- Acute on chronic, iron deficiency anemia component.  Hemoglobin down 2 g 8/18, appropriately increased with 2 units PRBC.  Bleeding scan was negative.  Follow clinically.  AKI (acute kidney injury) ([place]) -- Slowly improving, multifactorial including contrast and diuretic, possibly superimposed on CKD stage IIIa. -- Baseline creatinine unclear but may be around 1.4 with last values in 2018. -- No signs or symptoms of decompensated CHF. -- Judicious fluid today, repeat BMP in AM.  Proteinuria --close outpatient follow-up  CKD (chronic kidney disease), stage III ([place]) --see AKI  Benign essential HTN --  Blood pressure is more elevated now.  Increase hydralazine. Add amlodipine.  Continue carvedilol.  Chronic diastolic CHF (congestive heart failure) (HCC) -- Apparently with acute component on admission, I do not see any evidence of decompensation at this point. -- Continue carvedilol, hydralazine.  Apparently not on a diuretic as an outpatient.  Follow volume status clinically.  Disposition Plan:  Discussion: Abdominal pain seems to be resolved, blood counts so far seems to be improved, no bleeding noted.  We will continue to observe. Main issue now is acute kidney injury which appears to be slowly improving.  Hopefully home in the next 48 hours.  Status is: Inpatient  Remains inpatient appropriate because:Inpatient level of care appropriate due to severity of illness  Dispo: The patient is from: Home              Anticipated d/c is to: Home              Patient currently is not medically stable to d/c.   Difficult to place patient No  DVT prophylaxis: SCDs Start: 02/10/21 1548   Code Status: Full Code Level of care: Telemetry Family Communication: called son, no answer 8/20  Murray Hodgkins, MD  [facility] Direct contact: see [email or web address] (further directions at bottom of note if needed) 7PM-7AM contact night coverage as at bottom of note 02/14/2021, 3:55 PM  LOS: 4 days    Consults:  GI   Procedures:  EGD LA grade A esophagitis   Interval History/Subjective  CC: f/u abd pain  Better today, no abd pain No bleeding noted  Objective   Vitals:  Vitals:   02/14/21 1115 02/14/21 1342  BP: 136/68 (!) 146/84  Pulse:  82  Resp:  11  Temp:  98.9 F (  37.2 C)  SpO2:      Exam: Physical Exam Constitutional:      General: He is not in acute distress.    Appearance: He is not ill-appearing or toxic-appearing.  Cardiovascular:     Rate and Rhythm: Normal rate and regular rhythm.     Heart sounds: No murmur heard. Pulmonary:     Effort: No respiratory distress.      Breath sounds: Normal breath sounds. No wheezing or rales.  Abdominal:     General: Abdomen is flat. There is no distension.     Palpations: Abdomen is soft.     Tenderness: There is no abdominal tenderness. There is no guarding.  Neurological:     Mental Status: He is alert.  Psychiatric:        Behavior: Behavior normal.   I have personally reviewed the labs and other data, making special note of:   Today's Data  Hemoglobin up to 9.8 after 2 units PRBC Creatinine trending down, 2.15  Scheduled Meds:  amLODipine  5 mg Oral Daily   atorvastatin  20 mg Oral Daily   carvedilol  12.5 mg Oral BID   feeding supplement  237 mL Oral TID BM   hydrALAZINE  50 mg Oral Q6H   lidocaine  1 patch Transdermal Q24H   multivitamin with minerals  1 tablet Oral Daily   pantoprazole (PROTONIX) IV  40 mg Intravenous Q12H   technetium labeled red blood cells  0000000 millicurie Intravenous Once   Continuous Infusions:    Principal Problem:   Intractable abdominal pain Active Problems:   AKI (acute kidney injury) (HCC)   Anemia   CKD (chronic kidney disease), stage III (HCC)   Proteinuria   Chronic diastolic CHF (congestive heart failure) ([place])   Benign essential HTN   LOS: 4 days   How to contact the [facility] Attending or Consulting provider 7A - 7P or covering provider during after hours [place], for this patient?  Check the care team in [facility] and look for a) attending/consulting TRH provider listed and b) the [facility] team listed Log into [email or web address] and use [place]'s universal password to access. If you do not have the password, please contact the hospital operator. Locate the [facility] provider you are looking for under [facility] and page to a number that you can be directly reached. If you still have difficulty reaching the provider, please page the [facility] (Director on Call) for the Hospitalists listed on amion for assistance.

## 2021-02-14 NOTE — Progress Notes (Signed)
[facility] Gastroenterology Progress Note  Logan [name] 86 y.o. 08-Mar-1935  CC:   Abdominal pain, nausea and vomiting   Subjective: Patient seen and examined at bedside.  Sitting comfortably in the recliner.  He denies any abdominal pain, nausea and vomiting.  Denies diarrhea or constipation.  Last bowel movement yesterday.  ROS : Afebrile.  Negative for chest pain.   Objective: Vital signs in last 24 hours: Vitals:   02/14/21 0600 02/14/21 0813  BP: (!) 145/84 (!) 191/90  Pulse:    Resp:    Temp:    SpO2:      Physical Exam:  General:  Alert, cooperative, no distress, appears stated age  Head:  Normocephalic, without obvious abnormality, atraumatic  Eyes:  , EOM's intact,   Lungs:   No visible respiratory distress  Heart:  Regular rate and rhythm  Abdomen:   Soft, non-tender, nondistended, bowel sounds present  Neuro Alert and oriented x3       Lab Results: Recent Labs    02/13/21 0126 02/14/21 0137  NA 143 140  K 3.6 3.6  CL 113* 112*  CO2 24 21*  GLUCOSE 92 96  BUN 23 22  CREATININE 2.38* 2.15*  CALCIUM 8.1* 8.0*   Recent Labs    02/12/21 0454 02/13/21 0126  AST 14* 21  ALT 11 13  ALKPHOS 42 37*  BILITOT 0.5 0.7  PROT 4.9* 5.1*  ALBUMIN 3.1* 3.4*   Recent Labs    02/13/21 0126 02/14/21 0137  WBC 8.1 8.9  HGB 7.2*  7.1* 9.8*  HCT 23.6*  23.5* 31.0*  MCV 75.6* 75.8*  PLT 280 284   No results for input(s): LABPROT, INR in the last 72 hours.  CT abdomen pelvis with contrast February 10, 2021 negative for acute GI changes.  Ultrasound right upper quadrant February 10, 2021 was limited in evaluation for gallbladder but otherwise showed no acute changes.    EGD February 11, 2021 showed LA grade A esophagitis and small hiatal hernia otherwise normal EGD.  Biopsies from esophagitis was performed.  He was subsequently found to have drop in hemoglobin to 6.4  GI bleeding scan negative 08/19    Assessment/Plan: -Abdominal pain.  Negative CT scan.   No leukocytosis.  Ultrasound was limited in evaluation of gallbladder but showed no acute changes.  Normal LFTs. -Acute on chronic anemia.  No overt bleeding.  Bleeding scan negative.  Recent EGD showed mild esophagitis otherwise no evidence of active bleeding. -Chronic CHF -Chronic kidney disease   Recommendation -------------------------- -Hemoglobin improved to 9.8 after 2 units of blood transfusion.  Patient denies any overt bleeding -Abdominal pain has resolved.  His right sided abdominal pain was worse with activity and was not related to food intake. -Called and discussed with the family over the phone. -They would like to keep patient in hospital till Monday to get HIDA scan and also for monitoring of his hemoglobin -Patient would like to continue full liquid diet for now without any further advancement in the diet. -Discussed with RN at bedside  Otis Brace MD, [place] 02/14/2021, 8:39 AM  Contact #  [phone]

## 2021-02-15 DIAGNOSIS — N1832 Chronic kidney disease, stage 3b: Secondary | ICD-10-CM

## 2021-02-15 LAB — BASIC METABOLIC PANEL
Anion gap: 6 (ref 5–15)
BUN: 26 mg/dL — ABNORMAL HIGH (ref 8–23)
CO2: 23 mmol/L (ref 22–32)
Calcium: 7.7 mg/dL — ABNORMAL LOW (ref 8.9–10.3)
Chloride: 109 mmol/L (ref 98–111)
Creatinine, Ser: 2.39 mg/dL — ABNORMAL HIGH (ref 0.61–1.24)
GFR, Estimated: 26 mL/min — ABNORMAL LOW (ref >60)
Glucose, Bld: 128 mg/dL — ABNORMAL HIGH (ref 70–99)
Potassium: 3.4 mmol/L — ABNORMAL LOW (ref 3.5–5.1)
Sodium: 138 mmol/L (ref 135–145)

## 2021-02-15 LAB — CBC
HCT: 31.6 % — ABNORMAL LOW (ref 39.0–52.0)
Hemoglobin: 9.9 g/dL — ABNORMAL LOW (ref 13.0–17.0)
MCH: 23.6 pg — ABNORMAL LOW (ref 26.0–34.0)
MCHC: 31.3 g/dL (ref 30.0–36.0)
MCV: 75.4 fL — ABNORMAL LOW (ref 80.0–100.0)
Platelets: 303 10*3/uL (ref 150–400)
RBC: 4.19 MIL/uL — ABNORMAL LOW (ref 4.22–5.81)
RDW: 21 % — ABNORMAL HIGH (ref 11.5–15.5)
WBC: 7.6 10*3/uL (ref 4.0–10.5)
nRBC: 0 % (ref 0.0–0.2)

## 2021-02-15 MED ORDER — SODIUM CHLORIDE 0.9 % IV SOLN: INTRAVENOUS | Status: DC

## 2021-02-15 MED ORDER — POTASSIUM CHLORIDE CRYS ER 20 MEQ PO TBCR
40.0000 meq | EXTENDED_RELEASE_TABLET | Freq: Once | ORAL | Status: AC
  Administered 2021-02-15: 40 meq via ORAL
  Filled 2021-02-15: qty 2

## 2021-02-15 NOTE — Progress Notes (Signed)
PROGRESS NOTE  Logan [name] [id number] DOB: 28-Oct-1934 DOA: 02/10/2021 PCP: Jilda Panda, MD  Brief History   86 year old man PMH gastric ulcer, GI bleed, chronic iron deficiency anemia presenting with epigastric abdominal pain, nausea, vomiting.  Admitted for further evaluation and GI consultation.  CT revealed no acute abnormalities.  Right upper quadrant ultrasound was unrevealing but limited secondary to overlying bowel gas.  EGD revealed LA grade a reflux esophagitis with no bleeding.  No reported bleeding, however hemoglobin dropped 2 g. Etiology unclear nuclear medicine bleeding scan was negative.  Hemoglobin appropriately increased with transfusion.  No bleeding reported.  Initially acute kidney injury suspected, but now probably represents CKD.  Abdominal pain essentially resolved at this point.  Plan for HIDA scan 8/22 and then likely discharge home if cleared by gastroenterology.  A & P  * Intractable abdominal pain -- Resolved.  Tolerating diet.  Right upper quadrant ultrasound was unremarkable but limited secondary to overlying bowel gas.  CT abdomen/pelvis was unremarkable.  LFTs and lipase unremarkable.  Given history of gastric ulcer disease, seen by gastroenterology, EGD relatively unrevealing showing LA grade a esophagitis.  Continue PPI.  -- Plan for HIDA tomorrow and likely discharge home if cleared by gastroenterology.  Anemia -- Acute on chronic, iron deficiency anemia component.  Hemoglobin down 2 g 8/18, appropriately increased with 2 units PRBC.  Bleeding scan was negative.  Follow clinically.  AKI (acute kidney injury) ([place]) -- Appears to have stabilized, may rather reflect CKD stage IIIb rather than acute kidney injury.  Management complicated by diuretic and contrast.   -- Baseline creatinine unclear but may be around 1.4 with last values in 2018.  At this point I suspect progression of CKD. -- No signs or symptoms of decompensated CHF. -- Some IV fluids today,  repeat BMP in AM.  Proteinuria --close outpatient follow-up  CKD (chronic kidney disease), stage III ([place]) --see AKI  Benign essential HTN -- Blood pressure is more elevated now.  Increase hydralazine. Add amlodipine.  Continue carvedilol.  Chronic diastolic CHF (congestive heart failure) (HCC) -- Apparently with acute component on admission, I do not see any evidence of decompensation at this point. -- Continue carvedilol, hydralazine.  Apparently not on a diuretic as an outpatient.  Follow volume status clinically.  Disposition Plan:  Discussion: better, tolerating diet, Hgb stable, will check HIDA tomorrow and then hopefully d/c  Status is: Inpatient  Remains inpatient appropriate because:Inpatient level of care appropriate due to severity of illness  Dispo: The patient is from: Home              Anticipated d/c is to: Home              Patient currently is not medically stable to d/c.   Difficult to place patient No  DVT prophylaxis: SCDs Start: 02/10/21 1548   Code Status: Full Code Level of care: Telemetry Family Communication: called son, no answer 8/20  Murray Hodgkins, MD  [facility] Direct contact: see [email or web address] (further directions at bottom of note if needed) 7PM-7AM contact night coverage as at bottom of note 02/15/2021, 4:38 PM  LOS: 5 days    Consults:  GI   Procedures:  EGD LA grade A esophagitis   Interval History/Subjective  CC: f/u abd pain  Ate tacos last night w/o pain per RN  Objective   Vitals:  Vitals:   02/15/21 0500 02/15/21 1439  BP: (!) 157/80 125/67  Pulse: 88 79  Resp: 16 18  Temp: 97.9  F (36.6 C) 97.8 F (36.6 C)  SpO2: 96% 97%    Exam: Physical Exam Cardiovascular:     Rate and Rhythm: Normal rate and regular rhythm.  Pulmonary:     Effort: No respiratory distress.     Breath sounds: No wheezing.  Abdominal:     General: There is no distension.     Tenderness: There is no abdominal tenderness. There is no  guarding.  Neurological:     Mental Status: He is alert.  Psychiatric:     Comments: Awake, alert   I have personally reviewed the labs and other data, making special note of:   Today's Data  Creatinine 3.4 BUN up to 26 Creatinine back up 2.39 Hgb stable 9.9  Scheduled Meds:  amLODipine  5 mg Oral Daily   atorvastatin  20 mg Oral Daily   carvedilol  12.5 mg Oral BID   feeding supplement  237 mL Oral TID BM   hydrALAZINE  50 mg Oral Q6H   lidocaine  1 patch Transdermal Q24H   multivitamin with minerals  1 tablet Oral Daily   pantoprazole (PROTONIX) IV  40 mg Intravenous Q12H   technetium labeled red blood cells  0000000 millicurie Intravenous Once   Continuous Infusions:  sodium chloride 150 mL/hr at 02/15/21 1339     Principal Problem:   Intractable abdominal pain Active Problems:   AKI (acute kidney injury) ([place])   Anemia   CKD (chronic kidney disease), stage III (HCC)   Proteinuria   Chronic diastolic CHF (congestive heart failure) ([place])   Benign essential HTN   LOS: 5 days   How to contact the [facility] Attending or Consulting provider 7A - 7P or covering provider during after hours [place], for this patient?  Check the care team in [facility] and look for a) attending/consulting TRH provider listed and b) the [facility] team listed Log into [email or web address] and use [place]'s universal password to access. If you do not have the password, please contact the hospital operator. Locate the [facility] provider you are looking for under [facility] and page to a number that you can be directly reached. If you still have difficulty reaching the provider, please page the [facility] (Director on Call) for the Hospitalists listed on amion for assistance.

## 2021-02-15 NOTE — Progress Notes (Signed)
[facility] Gastroenterology Progress Note  Fawaz Fidalgo 86 y.o. [birth year]  CC:   Abdominal pain, nausea and vomiting   Subjective: Patient seen and examined at bedside.  Had some abdominal discomfort this morning which resolved with urination.  Discussed with RN.  Patient tolerating diet without any nausea and vomiting.  Denies any bleeding episodes.  ROS : Afebrile.  Negative for chest pain.   Objective: Vital signs in last 24 hours: Vitals:   02/14/21 2100 02/15/21 0500  BP: (!) 161/90 (!) 157/80  Pulse: 92 88  Resp: 16 16  Temp: 98 F (36.7 C) 97.9 F (36.6 C)  SpO2: 98% 96%    Physical Exam:  General:  Alert, cooperative, no distress, appears stated age  Head:  Normocephalic, without obvious abnormality, atraumatic  Eyes:  , EOM's intact,   Lungs:   No visible respiratory distress  Heart:  Regular rate and rhythm  Abdomen:   Soft, non-tender, nondistended, bowel sounds present  Neuro Alert and oriented x3       Lab Results: Recent Labs    02/14/21 0137 02/15/21 0350  NA 140 138  K 3.6 3.4*  CL 112* 109  CO2 21* 23  GLUCOSE 96 128*  BUN 22 26*  CREATININE 2.15* 2.39*  CALCIUM 8.0* 7.7*   Recent Labs    02/13/21 0126  AST 21  ALT 13  ALKPHOS 37*  BILITOT 0.7  PROT 5.1*  ALBUMIN 3.4*   Recent Labs    02/14/21 0137 02/15/21 0350  WBC 8.9 7.6  HGB 9.8* 9.9*  HCT 31.0* 31.6*  MCV 75.8* 75.4*  PLT 284 303   No results for input(s): LABPROT, INR in the last 72 hours.  CT abdomen pelvis with contrast February 10, 2021 negative for acute GI changes.  Ultrasound right upper quadrant February 10, 2021 was limited in evaluation for gallbladder but otherwise showed no acute changes.    EGD February 11, 2021 showed LA grade A esophagitis and small hiatal hernia otherwise normal EGD.  Biopsies from esophagitis was performed.  He was subsequently found to have drop in hemoglobin to 6.4  GI bleeding scan negative 08/19    Assessment/Plan: -Abdominal  pain.  Negative CT scan.  No leukocytosis.  Ultrasound was limited in evaluation of gallbladder but showed no acute changes.  Normal LFTs.  Abdominal pain resolved now. -Acute on chronic anemia.  No overt bleeding.  Bleeding scan negative.  Recent EGD showed mild esophagitis otherwise no evidence of active bleeding. -Chronic CHF -Chronic kidney disease   Recommendation -------------------------- - HIDA scan tomorrow -Hemoglobin stable -Further management based on  HIDA scan findings -GI will follow  Otis Brace MD, [place] 02/15/2021, 10:39 AM  Contact #  [phone]

## 2021-02-16 ENCOUNTER — Inpatient Hospital Stay (HOSPITAL_COMMUNITY): Payer: 59

## 2021-02-16 LAB — TYPE AND SCREEN
ABO/RH(D): B POS
Antibody Screen: NEGATIVE
Unit division: 0
Unit division: 0

## 2021-02-16 LAB — BPAM RBC
Blood Product Expiration Date: 202209032359
Blood Product Expiration Date: 202209032359
ISSUE DATE / TIME: 202208181953
ISSUE DATE / TIME: 202208191916
Unit Type and Rh: 7300
Unit Type and Rh: 7300

## 2021-02-16 MED ORDER — POTASSIUM CHLORIDE CRYS ER 20 MEQ PO TBCR: 40.0000 meq | EXTENDED_RELEASE_TABLET | Freq: Once | ORAL | Status: DC

## 2021-02-16 MED ORDER — TECHNETIUM TC 99M MEBROFENIN IV KIT
5.4000 | PACK | Freq: Once | INTRAVENOUS | Status: AC
  Administered 2021-02-16: 5.4 via INTRAVENOUS

## 2021-02-16 NOTE — TOC Transition Note (Signed)
Transition of Care [facility]) - CM/SW Discharge Note   Patient Details  Name: Logan [name] MRN: [id number] Date of Birth: 03-09-1935  Transition [facility] Care [facility]) CM/SW Contact:  Ross Ludwig, LCSW Phone Number: 02/16/2021, 4:41 PM   Clinical Narrative:     CSW was informed that patient need EMS transport back home.  CSW contacted patient's son to confirm home address.  Address was correct, patient discharging back home via EMS.  CSW signing off.   Final next level of care: Home/Self Care Barriers to Discharge: Barriers Resolved   Patient Goals and CMS Choice Patient states their goals for this hospitalization and ongoing recovery are:: To return back home. CMS Medicare.gov Compare Post Acute Care list provided to:: Patient Represenative (must comment) Choice offered to / list presented to : Adult Children  Discharge Placement                       Discharge Plan and Services                                     Social Determinants of Health (SDOH) Interventions     Readmission Risk Interventions No flowsheet data found.

## 2021-02-16 NOTE — Care Management Important Message (Signed)
Important Message  Patient Details IM Letter placed in Patient's room. Name: Logan [name] MRN: [id number] Date of Birth: 01/20/1935   Medicare Important Message Given:  Yes     Kerin Salen 02/16/2021, 1:02 PM

## 2021-02-16 NOTE — Progress Notes (Signed)
Upon discharge family requesting transportation home.

## 2021-02-16 NOTE — Discharge Summary (Addendum)
Physician Discharge Summary  Logan [name] [id number] DOB: 1935/05/01 DOA: 02/10/2021  PCP: Jilda Panda, MD  Admit date: 02/10/2021 Discharge date: 02/16/2021  Recommendations for Outpatient Follow-up:   Anemia -- see below  AKI -- Appears to have stabilized, suspect CKD stage IIIb rather than acute kidney injury.  Management complicated by diuretic and contrast. Cannot rule out AKI at this point. -- Baseline creatinine unclear but may be around 1.4 with last values in 2018.  At this point I suspect progression of CKD. -- No signs or symptoms of decompensated CHF. -- follow-up as an outpatient -- discussed with son by telephone  Proteinuria --close outpatient follow-up  CKD (chronic kidney disease), stage III ([place]) --see AKI    Follow-up Information     Jilda Panda, MD. Schedule an appointment as soon as possible for a visit in 1 week(s).   Specialty: Internal Medicine Contact information: [phone] [place] [address] [phone]                  Discharge Diagnoses: Principal diagnosis is #1 Principal Problem:   Intractable abdominal pain Active Problems:   AKI (acute kidney injury) ([place])   Anemia   CKD (chronic kidney disease), stage III (HCC)   Proteinuria   Chronic diastolic CHF (congestive heart failure) (HCC)   Benign essential HTN   Discharge Condition: improved Disposition: home  Diet recommendation:  Diet Orders (From admission, onward)     Start     Ordered   02/16/21 0000  Diet - low sodium heart healthy        02/16/21 1601   02/15/21 1524  Diet vegetarian Room service appropriate? No; Fluid consistency: Thin  Diet effective now       Question Answer Comment  Room service appropriate? No   Fluid consistency: Thin      02/15/21 1523             Filed Weights   02/14/21 0500 02/15/21 0500 02/16/21 0500  Weight: 63.7 kg 65.7 kg 67.1 kg    HPI/Hospital Course:   86 year old man PMH gastric ulcer, GI bleed,  chronic iron deficiency anemia presenting with epigastric abdominal pain, nausea, vomiting.  Admitted for further evaluation and GI consultation.  CT revealed no acute abnormalities.  Right upper quadrant ultrasound was unrevealing but limited secondary to overlying bowel gas.  EGD revealed LA grade a reflux esophagitis with no bleeding.  No reported bleeding, however hemoglobin dropped 2 g. Etiology unclear nuclear medicine bleeding scan was negative.  Hemoglobin appropriately increased with transfusion.  No bleeding reported.  Hemoglobin stable.  Initially acute kidney injury suspected, but now probably represents CKD.  Abdominal pain resolved.  HIDA scan normal.  Doing well, no pain, no indication for any further hospitalization.  Follow-up as an outpatient.  * Intractable abdominal pain -- Resolved.  Tolerating diet.  Right upper quadrant ultrasound was unremarkable but limited secondary to overlying bowel gas.  CT abdomen/pelvis was unremarkable.  LFTs and lipase unremarkable.  Given history of gastric ulcer disease, seen by gastroenterology, EGD relatively unrevealing showing LA grade a esophagitis.  Continue PPI.  HIDA normal.  Anemia -- Acute on chronic, iron deficiency anemia component.  Hemoglobin down 2 g 8/18, appropriately increased with 2 units PRBC.  Bleeding scan was negative.  Patient  AKI -- Appears to have stabilized, suspect CKD stage IIIb rather than acute kidney injury.  Management complicated by diuretic and contrast. Cannot rule out AKI at this point. -- Baseline creatinine unclear but  may be around 1.4 with last values in 2018.  At this point I suspect progression of CKD. -- No signs or symptoms of decompensated CHF. -- follow-up as an outpatient  Proteinuria --close outpatient follow-up  CKD (chronic kidney disease), stage III ([place]) --see AKI  Benign essential HTN -- Blood pressure stable.  Chronic diastolic CHF (congestive heart failure) (HCC) -- Apparently with  acute component on admission, no evidence of decompensation at this point. -- Continue carvedilol, hydralazine.  Apparently not on a diuretic as an outpatient.  Follow volume status clinically.   Consults:  GI   Procedures:  EGD LA grade A esophagitis  Today's assessment: S: CC: f/u abdominal pain  Seen with help of iPad interpretor   Feels fine, no pain, wants to go home  O: Vitals:  Vitals:   02/16/21 0459 02/16/21 1212  BP: (!) 163/83 (!) 179/74  Pulse: 88 84  Resp: 18 18  Temp: 98.5 F (36.9 C) 98.4 F (36.9 C)  SpO2: 96% 97%    Constitutional:  Appears calm and comfortable Respiratory:  CTA bilaterally, no w/r/r.  Respiratory effort normal.  Cardiovascular:  RRR, no m/r/g Abdomen:  soft Psychiatric:  Mental status Mood, affect appropriate  HIDA normal Creatinine stable Hgb stable  Discharge Instructions  Discharge Instructions     Diet - low sodium heart healthy   Complete by: As directed    Discharge instructions   Complete by: As directed    Call your physician or seek immediate medical attention for pain, vomiting, bleeding, lethargy or worsening of condition.   Increase activity slowly   Complete by: As directed       Allergies as of 02/16/2021   No Known Allergies      Medication List     STOP taking these medications    diclofenac Sodium 1 % Gel Commonly known as: Voltaren   methocarbamol 500 MG tablet Commonly known as: ROBAXIN       TAKE these medications    acetaminophen 500 MG tablet Commonly known as: TYLENOL Take 1,000 mg by mouth every 6 (six) hours as needed for mild pain.   atorvastatin 20 MG tablet Commonly known as: LIPITOR Take 20 mg by mouth daily.   carvedilol 6.25 MG tablet Commonly known as: COREG Take 6.25 mg by mouth 2 (two) times daily.   feeding supplement Liqd Take 237 mLs by mouth 2 (two) times daily between meals.   metoCLOPramide 5 MG tablet Commonly known as: REGLAN Take 5 mg by mouth  4 (four) times daily.   pantoprazole 40 MG tablet Commonly known as: PROTONIX Take 1 tablet (40 mg total) 2 (two) times daily by mouth.       No Known Allergies  The results of significant diagnostics from this hospitalization (including imaging, microbiology, ancillary and laboratory) are listed below for reference.    Significant Diagnostic Studies: DG Chest 1 View  Result Date: 02/11/2021 CLINICAL DATA:  CHF EXAM: CHEST  1 VIEW COMPARISON:  Chest radiograph 04/10/2017 FINDINGS: The patient is mildly rotated to the right. The heart is mildly enlarged. Mediastinal contours are otherwise within normal limits, allowing for patient rotation. There is calcified atherosclerotic plaque of the aortic arch. There are small bilateral pleural effusions. Hazy right basilar and retrocardiac opacities likely reflects subsegmental atelectasis. The upper lungs are well aerated. There is no overt pulmonary edema. There is no pneumothorax. The bones are unremarkable. IMPRESSION: Mild cardiomegaly with small bilateral pleural effusions. Adjacent bibasilar opacities most likely reflect subsegmental  atelectasis. Electronically Signed   By: Valetta Mole M.D.   On: 02/11/2021 08:12   NM GI Blood Loss  Result Date: 02/13/2021 CLINICAL DATA:  Evaluate active GI bleeding. EXAM: NUCLEAR MEDICINE GASTROINTESTINAL BLEEDING SCAN TECHNIQUE: Sequential abdominal images were obtained following intravenous administration of Tc-83mlabeled red blood cells. RADIOPHARMACEUTICALS:  23.4 mCi Tc-942mertechnetate in-vitro labeled red cells. COMPARISON:  02/10/2021 FINDINGS: No abnormal extravasation of the radiopharmaceutical to suggest active GI bleed. Normal physiologic tracer distribution is noted throughout the blood pool. IMPRESSION: No signs of active GI bleed. Electronically Signed   By: TaKerby Moors.D.   On: 02/13/2021 13:56   CT ABDOMEN PELVIS W CONTRAST  Result Date: 02/10/2021 CLINICAL DATA:  Right lower quadrant  pain EXAM: CT ABDOMEN AND PELVIS WITH CONTRAST TECHNIQUE: Multidetector CT imaging of the abdomen and pelvis was performed using the standard protocol following bolus administration of intravenous contrast. CONTRAST:  655mMNIPAQUE IOHEXOL 350 MG/ML SOLN COMPARISON:  CT abdomen/pelvis 11/26/2016 FINDINGS: Lower chest: There are bilateral pleural effusions with adjacent subsegmental atelectasis. The imaged heart is unremarkable. There is no pericardial effusion. Hepatobiliary: Tiny hypodense lesions in the liver are too small to characterize, but likely reflects cysts. There are no suspicious focal lesions. The gallbladder is normal. There is no intra or extrahepatic biliary ductal dilatation. Pancreas: There are no focal lesions or contour abnormalities. There is no main pancreatic ductal dilatation or peripancreatic inflammatory change. Spleen: Unremarkable. Adrenals/Urinary Tract: The adrenals are unremarkable. There is cortical thinning of the left kidney likely reflecting scarring. Hypodense lesions in the left kidney are too small to characterize but likely reflects cysts, not significantly changed. There are no other focal lesions. There are no calculi. There is no hydronephrosis or hydroureter. The bladder is not well assessed due to decompression and streak artifact from right hip arthroplasty hardware. Stomach/Bowel: The stomach is unremarkable. There is no evidence of bowel obstruction. The appendix is unremarkable. There is no abnormal bowel wall thickening or inflammatory change. There are scattered colonic diverticuli without evidence of acute diverticulosis. Vascular/Lymphatic: There is calcified atherosclerotic plaque throughout the nonaneurysmal abdominal aorta and at the origins of the major branch vessels. The SMA, celiac axis, and IMA are patent. The main portal and splenic veins are patent. There is no abdominal or pelvic lymphadenopathy. Reproductive: The prostate and seminal vesicles are  grossly unremarkable but suboptimally assessed due to streak artifact. Other: There is scattered free fluid in the abdomen and pelvis. Fluid is also seen in the left inguinal canal. There is no free intraperitoneal air. There is mild diffuse body wall edema. Musculoskeletal: The patient is status post right hip arthroplasty. There is mild anterior wedge deformity of the L1 vertebral body, unchanged since 2018. There is multilevel degenerative change throughout the remainder of the lumbar spine. IMPRESSION: 1. Bilateral pleural effusions, mild diffuse body wall edema, and small volume ascites in the abdomen pelvis raises the possibility of fluid overload. 2. Otherwise, no acute findings in the abdomen or pelvis. Specifically, no evidence of appendicitis. 3. Scattered colonic diverticula. 4.  Aortic Atherosclerosis (ICD10-I70.0). Electronically Signed   By: PetValetta MoleD.   On: 02/10/2021 11:56   NM Hepato W/EF  Result Date: 02/16/2021 CLINICAL DATA:  Right upper quadrant abdominal pain, nondiagnostic abdominal ultrasound EXAM: NUCLEAR MEDICINE HEPATOBILIARY IMAGING WITH GALLBLADDER EF TECHNIQUE: Sequential images of the abdomen were obtained out to 60 minutes following intravenous administration of radiopharmaceutical. After oral ingestion of Ensure, gallbladder ejection fraction was determined. At 60 min,  normal ejection fraction is greater than 33%. RADIOPHARMACEUTICALS:  5.4 mCi Tc-59m Choletec IV COMPARISON:  None. FINDINGS: Prompt uptake and biliary excretion of activity by the liver is seen. Gallbladder activity is visualized, consistent with patency of cystic duct. Biliary activity passes into small bowel, consistent with patent common bile duct. Calculated gallbladder ejection fraction is 97%. (Normal gallbladder ejection fraction with Ensure is greater than 33%.) IMPRESSION: No evidence of acute or chronic cholecystitis. Electronically Signed   By: PValetta MoleM.D.   On: 02/16/2021 15:17   [place] Abdomen Limited  Result Date: 02/10/2021 CLINICAL DATA:  Right upper quadrant abdominal pain. EXAM: ULTRASOUND ABDOMEN LIMITED RIGHT UPPER QUADRANT COMPARISON:  CT abdomen pelvis 05/06/2017, CT abdomen pelvis 02/10/2021 FINDINGS: Gallbladder: Markedly limited evaluation due to overlying bowel gas. No definite gallstones or wall thickening visualized. No sonographic Murphy sign noted by sonographer. Common bile duct: Diameter: 6 mm. Liver: No focal lesion identified. Within normal limits in parenchymal echogenicity. Portal vein is patent on color Doppler imaging with normal direction of blood flow towards the liver. Other: Trace simple free fluid within right upper quadrant. IMPRESSION: 1. Markedly limited/nondiagnostic evaluation of the gallbladder due to overlying bowel gas. 2. Trace simple free fluid within the upper abdomen. Electronically Signed   By: MIven FinnM.D.   On: 02/10/2021 16:35    Microbiology: Recent Results (from the past 240 hour(s))  SARS CORONAVIRUS 2 (TAT 6-24 HRS) Nasopharyngeal Nasopharyngeal Swab     Status: None   Collection Time: 02/10/21  3:03 PM   Specimen: Nasopharyngeal Swab  Result Value Ref Range Status   SARS Coronavirus 2 NEGATIVE NEGATIVE Final    Comment: (NOTE) SARS-CoV-2 target nucleic acids are NOT DETECTED.  The SARS-CoV-2 RNA is generally detectable in upper and lower respiratory specimens during the acute phase of infection. Negative results do not preclude SARS-CoV-2 infection, do not rule out co-infections with other pathogens, and should not be used as the sole basis for treatment or other patient management decisions. Negative results must be combined with clinical observations, patient history, and epidemiological information. The expected result is Negative.  Fact Sheet for Patients: hSugarRoll.be Fact Sheet for Healthcare Providers: hhttps://www.woods-mathews.com/ This test is not yet approved  or cleared by the [place] and  has been authorized for detection and/or diagnosis of SARS-CoV-2 by FDA under an Emergency Use Authorization (EUA). This EUA will remain  in effect (meaning this test can be used) for the duration of the COVID-19 declaration under Se ction 564(b)(1) of the Act, 21 U.S.C. section 360bbb-3(b)(1), unless the authorization is terminated or revoked sooner.  Performed at [facility] [address] [place] [address]     Labs: Basic Metabolic Panel: Recent Labs  Lab 02/10/21 2214 02/11/21 0404 02/12/21 0454 02/13/21 0126 02/14/21 0137 02/15/21 0350  NA  --  140 141 143 140 138  K  --  3.9 3.6 3.6 3.6 3.4*  CL  --  108 111 113* 112* 109  CO2  --  '24 23 24 '$ 21* 23  GLUCOSE  --  82 115* 92 96 128*  BUN  --  19 25* 23 22 26*  CREATININE  --  1.91* 2.53* 2.38* 2.15* 2.39*  CALCIUM  --  8.2* 8.1* 8.1* 8.0* 7.7*  MG 2.0  --   --   --   --   --    Liver Function Tests: Recent Labs  Lab 02/10/21 0909 02/12/21 0454 02/13/21  0126  AST 27 14* 21  ALT '18 11 13  '$ ALKPHOS 69 42 37*  BILITOT 0.3 0.5 0.7  PROT 5.3* 4.9* 5.1*  ALBUMIN 1.9* 3.1* 3.4*   Recent Labs  Lab 02/10/21 0909 02/13/21 0126  LIPASE 29 31   CBC: Recent Labs  Lab 02/10/21 0909 02/11/21 1126 02/12/21 1140 02/13/21 0126 02/14/21 0137 02/15/21 0350  WBC 8.5 12.0* 8.4 8.1 8.9 7.6  NEUTROABS 6.4  --   --   --   --   --   HGB 8.1* 8.4* 6.4* 7.2*  7.1* 9.8* 9.9*  HCT 28.0* 28.5* 22.0* 23.6*  23.5* 31.0* 31.6*  MCV 71.6* 70.9* 70.7* 75.6* 75.8* 75.4*  PLT 419* 371 275 280 284 303   Principal Problem:   Intractable abdominal pain Active Problems:   AKI (acute kidney injury) ([place])   Anemia   CKD (chronic kidney disease), stage III (HCC)   Proteinuria   Chronic diastolic CHF (congestive heart failure) (HCC)   Benign essential HTN   Time coordinating discharge: 35 minutes  Signed:  Murray Hodgkins, MD  [facility]  02/16/2021, 4:02  PM

## 2021-02-16 NOTE — Progress Notes (Signed)
I called interpreter [phone] DEVAL  Patient speaks [place]. I spoke to the patient about pain. Pt states he is in no pain and does not need the ordered Lidocaine patch. I also explained to the patient that I spoke with his son twice today after getting consent from the patient.   The results have not returned from the scan performed today. I explained to the patient we will know more about the discharge plan once the results come back.

## 2021-02-16 NOTE — TOC Progression Note (Signed)
Transition of Care [facility]) - Progression Note    Patient Details  Name: Logan [name] MRN: [id number] Date of Birth: 1935-02-07  [facility]) CM/SW Contact  Purcell Mouton, RN Phone Number: 02/16/2021, 1:32 PM  Clinical Narrative:    Pt will need PTAR at discharge.         Expected Discharge Plan and Services                                                 Social Determinants of Health (SDOH) Interventions    Readmission Risk Interventions No flowsheet data found.

## 2021-02-16 NOTE — Progress Notes (Signed)
PT Cancellation Note  Patient Details Name: Logan [name] MRN: [id number] DOB: Sep 20, 1934   Cancelled Treatment:    Reason Eval/Treat Not Completed: Patient at procedure or test/unavailable when PT attempted treatment. Will check back tomorrow.   Claretha Cooper 02/16/2021, 3:59 PM Tresa Endo PT Acute Rehabilitation Services Pager [phone] Office [phone]

## 2021-02-16 NOTE — Plan of Care (Signed)
HIDA scan results pending.  [facility] will revisit tomorrow.

## 2021-02-16 NOTE — Plan of Care (Signed)
Pt discharged. I called the interpreter and son [facility]) and had then both on the phone with the patient. I went through the discharge paperwork and asked if anyone had any questions. All questions answered. IV removed. Discharge paperwork added to discharge folder. EMS to arrive around 8 pm, per Education officer, museum.

## 2021-02-17 LAB — SURGICAL PATHOLOGY

## 2021-02-25 NOTE — Progress Notes (Signed)
[facility] NOTE  Patient Care Team: Jilda Panda, MD as PCP - General (Internal Medicine)  CHIEF COMPLAINTS/PURPOSE OF CONSULTATION:  Newly diagnosed iron deficiency anemia  HISTORY OF PRESENTING ILLNESS:  Logan [name] 86 y.o. male is here because of recent diagnosis of iron deficiency anemia. Labs on 02/15/2021 showed Hg 9.9, HCT 31.6, MCV 75.4, PLT 303. He presents to the clinic today for initial evaluation and discussion of treatment options.  He was brought in by ambulance accompanied by his son.  His son reports that he sleeps all day and has had not been eating or drinking much for the past several days.  He has lost about 10 pounds and has gotten progressively weaker.  When asked why he is not eating he reports that he just feels nauseated and does not have any appetite.  He feels very anxious and dizzy and lightheaded.  His blood pressures also been running high.  He is in a wheelchair and tells me that he wants to go to sleep right now.  I reviewed his records extensively and collaborated the history with the patient.  MEDICAL HISTORY:  Past Medical History:  Diagnosis Date   Anemia    Cataract    Esophageal ulcer    Gastritis    proximal   Hiatal hernia    Hyperlipidemia    Intractable hiccups    Reflux esophagitis    Stroke ([place])    r side deficits; September 2017   Upper GI bleed     SURGICAL HISTORY: Past Surgical History:  Procedure Laterality Date   BIOPSY  02/11/2021   Procedure: BIOPSY;  Surgeon: Clarene Essex, MD;  Location: [facility];  Service: Endoscopy;;   COLONOSCOPY     ESOPHAGOGASTRODUODENOSCOPY     ESOPHAGOGASTRODUODENOSCOPY N/A 03/04/2017   Procedure: ESOPHAGOGASTRODUODENOSCOPY (EGD);  Surgeon: Mauri Pole, MD;  Location: [facility] END[facility];  Service: Endoscopy;  Laterality: N/A;   ESOPHAGOGASTRODUODENOSCOPY (EGD) WITH PROPOFOL N/A 02/11/2021   Procedure: ESOPHAGOGASTRODUODENOSCOPY (EGD) WITH PROPOFOL;  Surgeon: Clarene Essex,  MD;  Location: [facility] ENDOSCOPY;  Service: Endoscopy;  Laterality: N/A;   UPPER GASTROINTESTINAL ENDOSCOPY      SOCIAL HISTORY: Social History   Socioeconomic History   Marital status: Married    Spouse name: Not on file   Number of children: Not on file   Years of education: Not on file   Highest education level: Not on file  Occupational History   Occupation: retired  Tobacco Use   Smoking status: Never   Smokeless tobacco: Never  Vaping Use   Vaping Use: Never used  Substance and Sexual Activity   Alcohol use: No   Drug use: No   Sexual activity: Not on file  Other Topics Concern   Not on file  Social History Narrative   Married 2 sons (at least)   Spends time with them in [place] and [place]   Social Determinants of Health   Financial Resource Strain: Not on file  Food Insecurity: Not on file  Transportation Needs: Not on file  Physical Activity: Not on file  Stress: Not on file  Social Connections: Not on file  Intimate Partner Violence: Not on file    FAMILY HISTORY: Family History  Problem Relation Age of Onset   Colon cancer Neg Hx    Esophageal cancer Neg Hx    Rectal cancer Neg Hx     ALLERGIES:  has No Known Allergies.  MEDICATIONS:  Current Outpatient Medications  Medication Sig Dispense Refill  acetaminophen (TYLENOL) 500 MG tablet Take 1,000 mg by mouth every 6 (six) hours as needed for mild pain.     atorvastatin (LIPITOR) 20 MG tablet Take 20 mg by mouth daily.     carvedilol (COREG) 6.25 MG tablet Take 6.25 mg by mouth 2 (two) times daily.     feeding supplement, ENSURE ENLIVE, (ENSURE ENLIVE) LIQD Take 237 mLs by mouth 2 (two) times daily between meals. (Patient not taking: Reported on 02/10/2021)     metoCLOPramide (REGLAN) 5 MG tablet Take 5 mg by mouth 4 (four) times daily.     pantoprazole (PROTONIX) 40 MG tablet Take 1 tablet (40 mg total) 2 (two) times daily by mouth. 60 tablet 1   No current facility-administered medications for this visit.     REVIEW OF SYSTEMS:   Severe failure to thrive  PHYSICAL EXAMINATION: ECOG PERFORMANCE STATUS: 3 - Symptomatic, >50% confined to bed  Vitals:   02/26/21 1353  BP: (!) 181/78  Pulse: 62  Resp: 16  Temp: 97.9 F (36.6 C)  SpO2: 100%   Filed Weights      LABORATORY DATA:  I have reviewed the data as listed Lab Results  Component Value Date   WBC 7.6 02/15/2021   HGB 9.9 (L) 02/15/2021   HCT 31.6 (L) 02/15/2021   MCV 75.4 (L) 02/15/2021   PLT 303 02/15/2021   Lab Results  Component Value Date   NA 138 02/15/2021   K 3.4 (L) 02/15/2021   CL 109 02/15/2021   CO2 23 02/15/2021    RADIOGRAPHIC STUDIES: I have personally reviewed the radiological reports and agreed with the findings in the report.  ASSESSMENT AND PLAN:  Iron deficiency anemia Chronic iron deficiency anemia hospitalization in September 2018 Hospitalization 02/10/2021-02/16/2021: Abdominal pain and iron deficiency anemia, EGD no bleeding.  Nuclear medicine bleeding scan -2 units PRBC transfused, HIDA scan normal Lab review 02/12/2021: Hemoglobin 6.4, MCV 70.7, ferritin 6 (was 7 about 3 years ago and 14 years ago), iron saturation 6% (was 3% 3 years ago) 6 02/15/2021: Hemoglobin 9.9, MCV 75.4  Recommendation: Patient will need IV iron infusions. However more importantly patient has severe failure to thrive. -Unclear etiology for failure to thrive: Patient's son is requesting transfer to emergency room so that he could be admitted and get worked up. -In the hospital he could potentially be treated with IV iron  For the lack of eating and drinking: He might benefit from PEG tube placement Previous hospitalization CT scans and upper endoscopy did not reveal any clear-cut reasons for his current issues.    All questions were answered. The patient knows to call the clinic with any problems, questions or concerns.   Rulon Eisenmenger, MD, MPH 02/26/2021    I, Thana Ates, am acting as scribe for Nicholas Lose, MD.  I have reviewed the above documentation for accuracy and completeness, and I agree with the above.

## 2021-02-26 ENCOUNTER — Inpatient Hospital Stay (HOSPITAL_COMMUNITY)
Admission: EM | Admit: 2021-02-26 | Discharge: 2021-03-03 | DRG: 866 | Disposition: A | Discharge status: Home Health | Payer: 59 | Attending: Internal Medicine | Admitting: Internal Medicine

## 2021-02-26 ENCOUNTER — Emergency Department (HOSPITAL_COMMUNITY)
Admit: 2021-02-26 | Discharge: 2021-02-26 | Disposition: A | Discharge status: Home / Self Care | Payer: 59 | Attending: Emergency Medicine | Admitting: Emergency Medicine

## 2021-02-26 ENCOUNTER — Inpatient Hospital Stay: Payer: 59 | Attending: Hematology and Oncology | Admitting: Hematology and Oncology

## 2021-02-26 ENCOUNTER — Other Ambulatory Visit: Payer: Self-pay

## 2021-02-26 ENCOUNTER — Encounter: Payer: Self-pay | Admitting: *Deleted

## 2021-02-26 ENCOUNTER — Emergency Department (HOSPITAL_COMMUNITY): Payer: 59

## 2021-02-26 ENCOUNTER — Encounter (HOSPITAL_COMMUNITY): Payer: Self-pay | Admitting: Oncology

## 2021-02-26 DIAGNOSIS — K2100 Gastro-esophageal reflux disease with esophagitis, without bleeding: Secondary | ICD-10-CM | POA: Diagnosis present

## 2021-02-26 DIAGNOSIS — R627 Adult failure to thrive: Secondary | ICD-10-CM | POA: Diagnosis present

## 2021-02-26 DIAGNOSIS — Z8719 Personal history of other diseases of the digestive system: Secondary | ICD-10-CM | POA: Diagnosis not present

## 2021-02-26 DIAGNOSIS — K208 Other esophagitis without bleeding: Secondary | ICD-10-CM | POA: Diagnosis present

## 2021-02-26 DIAGNOSIS — D500 Iron deficiency anemia secondary to blood loss (chronic): Secondary | ICD-10-CM

## 2021-02-26 DIAGNOSIS — Z79899 Other long term (current) drug therapy: Secondary | ICD-10-CM | POA: Diagnosis not present

## 2021-02-26 DIAGNOSIS — Z724 Inappropriate diet and eating habits: Secondary | ICD-10-CM | POA: Insufficient documentation

## 2021-02-26 DIAGNOSIS — B0089 Other herpesviral infection: Principal | ICD-10-CM | POA: Diagnosis present

## 2021-02-26 DIAGNOSIS — R1310 Dysphagia, unspecified: Secondary | ICD-10-CM | POA: Diagnosis present

## 2021-02-26 DIAGNOSIS — E440 Moderate protein-calorie malnutrition: Secondary | ICD-10-CM | POA: Diagnosis present

## 2021-02-26 DIAGNOSIS — M7989 Other specified soft tissue disorders: Secondary | ICD-10-CM | POA: Diagnosis not present

## 2021-02-26 DIAGNOSIS — E785 Hyperlipidemia, unspecified: Secondary | ICD-10-CM | POA: Diagnosis present

## 2021-02-26 DIAGNOSIS — D509 Iron deficiency anemia, unspecified: Secondary | ICD-10-CM | POA: Insufficient documentation

## 2021-02-26 DIAGNOSIS — E876 Hypokalemia: Secondary | ICD-10-CM | POA: Diagnosis present

## 2021-02-26 DIAGNOSIS — I130 Hypertensive heart and chronic kidney disease with heart failure and stage 1 through stage 4 chronic kidney disease, or unspecified chronic kidney disease: Secondary | ICD-10-CM | POA: Diagnosis present

## 2021-02-26 DIAGNOSIS — K5900 Constipation, unspecified: Secondary | ICD-10-CM | POA: Diagnosis present

## 2021-02-26 DIAGNOSIS — Z20822 Contact with and (suspected) exposure to covid-19: Secondary | ICD-10-CM | POA: Diagnosis present

## 2021-02-26 DIAGNOSIS — Z8711 Personal history of peptic ulcer disease: Secondary | ICD-10-CM | POA: Diagnosis not present

## 2021-02-26 DIAGNOSIS — I69351 Hemiplegia and hemiparesis following cerebral infarction affecting right dominant side: Secondary | ICD-10-CM | POA: Diagnosis not present

## 2021-02-26 DIAGNOSIS — N184 Chronic kidney disease, stage 4 (severe): Secondary | ICD-10-CM | POA: Diagnosis present

## 2021-02-26 DIAGNOSIS — I160 Hypertensive urgency: Secondary | ICD-10-CM | POA: Diagnosis present

## 2021-02-26 DIAGNOSIS — I5032 Chronic diastolic (congestive) heart failure: Secondary | ICD-10-CM | POA: Diagnosis present

## 2021-02-26 LAB — URINALYSIS, ROUTINE W REFLEX MICROSCOPIC
Bacteria, UA: NONE SEEN
Bilirubin Urine: NEGATIVE
Glucose, UA: 250 mg/dL — AB
Ketones, ur: NEGATIVE mg/dL
Leukocytes,Ua: NEGATIVE
Nitrite: NEGATIVE
Protein, ur: >300 mg/dL — AB
Specific Gravity, Urine: 1.015 (ref 1.005–1.030)
pH: 7 (ref 5.0–8.0)

## 2021-02-26 LAB — COMPREHENSIVE METABOLIC PANEL
ALT: 16 U/L (ref 0–44)
AST: 24 U/L (ref 15–41)
Albumin: 2.3 g/dL — ABNORMAL LOW (ref 3.5–5.0)
Alkaline Phosphatase: 64 U/L (ref 38–126)
Anion gap: 6 (ref 5–15)
BUN: 16 mg/dL (ref 8–23)
CO2: 28 mmol/L (ref 22–32)
Calcium: 8 mg/dL — ABNORMAL LOW (ref 8.9–10.3)
Chloride: 113 mmol/L — ABNORMAL HIGH (ref 98–111)
Creatinine, Ser: 2.37 mg/dL — ABNORMAL HIGH (ref 0.61–1.24)
GFR, Estimated: 27 mL/min — ABNORMAL LOW (ref >60)
Glucose, Bld: 105 mg/dL — ABNORMAL HIGH (ref 70–99)
Potassium: 3 mmol/L — ABNORMAL LOW (ref 3.5–5.1)
Sodium: 147 mmol/L — ABNORMAL HIGH (ref 135–145)
Total Bilirubin: 0.5 mg/dL (ref 0.3–1.2)
Total Protein: 5 g/dL — ABNORMAL LOW (ref 6.5–8.1)

## 2021-02-26 LAB — CBC WITH DIFFERENTIAL/PLATELET
Abs Immature Granulocytes: 0.02 10*3/uL (ref 0.00–0.07)
Basophils Absolute: 0.1 10*3/uL (ref 0.0–0.1)
Basophils Relative: 1 %
Eosinophils Absolute: 0.3 10*3/uL (ref 0.0–0.5)
Eosinophils Relative: 3 %
HCT: 35.2 % — ABNORMAL LOW (ref 39.0–52.0)
Hemoglobin: 10.8 g/dL — ABNORMAL LOW (ref 13.0–17.0)
Immature Granulocytes: 0 %
Lymphocytes Relative: 24 %
Lymphs Abs: 2.1 10*3/uL (ref 0.7–4.0)
MCH: 24.1 pg — ABNORMAL LOW (ref 26.0–34.0)
MCHC: 30.7 g/dL (ref 30.0–36.0)
MCV: 78.4 fL — ABNORMAL LOW (ref 80.0–100.0)
Monocytes Absolute: 0.4 10*3/uL (ref 0.1–1.0)
Monocytes Relative: 5 %
Neutro Abs: 5.7 10*3/uL (ref 1.7–7.7)
Neutrophils Relative %: 67 %
Platelets: 308 10*3/uL (ref 150–400)
RBC: 4.49 MIL/uL (ref 4.22–5.81)
RDW: 22.9 % — ABNORMAL HIGH (ref 11.5–15.5)
WBC: 8.6 10*3/uL (ref 4.0–10.5)
nRBC: 0 % (ref 0.0–0.2)

## 2021-02-26 LAB — BRAIN NATRIURETIC PEPTIDE: B Natriuretic Peptide: 246.6 pg/mL — ABNORMAL HIGH (ref 0.0–100.0)

## 2021-02-26 LAB — TYPE AND SCREEN
ABO/RH(D): B POS
Antibody Screen: NEGATIVE

## 2021-02-26 LAB — TROPONIN I (HIGH SENSITIVITY)
Troponin I (High Sensitivity): 15 ng/L (ref <18)
Troponin I (High Sensitivity): 16 ng/L (ref <18)

## 2021-02-26 LAB — PROTIME-INR
INR: 1 (ref 0.8–1.2)
Prothrombin Time: 12.6 seconds (ref 11.4–15.2)

## 2021-02-26 LAB — LIPASE, BLOOD: Lipase: 23 U/L (ref 11–51)

## 2021-02-26 MED ORDER — HYDRALAZINE HCL 20 MG/ML IJ SOLN
10.0000 mg | Freq: Once | INTRAMUSCULAR | Status: AC
  Administered 2021-02-26: 10 mg via INTRAVENOUS
  Filled 2021-02-26: qty 1

## 2021-02-26 MED ORDER — NICARDIPINE HCL IN NACL 20-0.86 MG/200ML-% IV SOLN
3.0000 mg/h | INTRAVENOUS | Status: DC
  Administered 2021-02-26 – 2021-02-27 (×2): 5 mg/h via INTRAVENOUS
  Filled 2021-02-26 (×2): qty 200

## 2021-02-26 MED ORDER — HYDRALAZINE HCL 25 MG PO TABS
25.0000 mg | ORAL_TABLET | Freq: Once | ORAL | Status: AC
  Administered 2021-02-26: 25 mg via ORAL
  Filled 2021-02-26: qty 1

## 2021-02-26 MED ORDER — LABETALOL HCL 5 MG/ML IV SOLN
10.0000 mg | Freq: Once | INTRAVENOUS | Status: AC
  Administered 2021-02-26: 10 mg via INTRAVENOUS
  Filled 2021-02-26: qty 4

## 2021-02-26 MED ORDER — SODIUM CHLORIDE 0.9 % IV BOLUS
500.0000 mL | Freq: Once | INTRAVENOUS | Status: AC
  Administered 2021-02-26: 500 mL via INTRAVENOUS

## 2021-02-26 NOTE — Progress Notes (Signed)
Right upper extremity and right lower extremity venous duplexes have been completed. Preliminary results can be found in CV Proc through chart review.  Results were given to Eustaquio Maize PA.  02/26/21 4:57 PM Logan [name] RVT

## 2021-02-26 NOTE — ED Notes (Signed)
Pt changed out of soiled underwear into new brief and clean sheets. Condom cath applied.

## 2021-02-26 NOTE — Assessment & Plan Note (Signed)
Chronic iron deficiency anemia hospitalization in September 2018 Hospitalization 02/10/2021-02/16/2021: Abdominal pain and iron deficiency anemia, EGD no bleeding.  Nuclear medicine bleeding scan -2 units PRBC transfused, HIDA scan normal Lab review 02/12/2021: Hemoglobin 6.4, MCV 70.7, ferritin 6 (was 7 about 3 years ago and 14 years ago), iron saturation 6% (was 3% 3 years ago) 6 02/15/2021: Hemoglobin 9.9, MCV 75.4  Iron deficiency anemia: I discussed with the patient the process of iron absorption. I counseled extensively regarding the different causes of iron deficiency including blood loss and malabsorption. Patient has had upper endoscopies and colonoscopies and did not have any clear identified source of blood loss. It is possible that the patient may still have an occult source of bleeding. However malabsorption is also a possibility.   Recommendation: proceed with IV iron infusions  I discussed with the patient that potentially there may be a need for additional IV iron infusions if the iron levels were to remain low in the future. The frequency of need of IV iron would depend on many other factors including the rate of loss and by the degree of absorption.  Return to clinic in 3 months with recheck on iron studies and hemoglobin.

## 2021-02-26 NOTE — ED Triage Notes (Addendum)
Pt presents w/ family from cancer [facility] d/t weakness, poor po intake, 10 lb weight loss in 2 days as well as swelling to right hand and right leg.

## 2021-02-26 NOTE — ED Notes (Signed)
Attempted to use [employer] translation device to speak [place]jarati with pt. Pt would not respond to translator.

## 2021-02-26 NOTE — Progress Notes (Signed)
Per MD pt needing to be transferred to ED for further evaluation of weight loss and fatigue.  RN placed call to ED charge RN who stated no beds are currently available.  CNA escorted pt and family to ED main lobby to check in.

## 2021-02-26 NOTE — ED Notes (Signed)
Per hematology, patient was a new patient-states he has several co morbidities that they can not address at this time-sending to ED for eval

## 2021-02-26 NOTE — ED Notes (Signed)
Patients daughter would like a call back [phone] Preeti

## 2021-02-26 NOTE — H&P (Signed)
TRH H&P    Patient Demographics:    Logan [name], is a 86 y.o. male  MRN: [id number]  DOB - [birth year]  Admit Date - 02/26/2021  Referring MD/NP/PA: Billy Fischer  Outpatient Primary MD for the patient is Jilda Panda, MD  Patient coming from: Home  Chief complaint- Failure to thrive   HPI:    Logan [name]  is a 86 y.o. male, with history of anemia, esophageal ulcer, hiatal hernia, HLD, GERD, Stroke, and GI bleed presents to the ED for failure to thrive.  Patient was recently discharged about 10 days ago.  At his last admission he was complaining of intractable abdominal pain, which has not complained at this time.  He had a right upper quadrant ultrasound that was unremarkable and a HIDA scan that was normal.  His LFTs and lipase were also unremarkable.  He had a history of gastric ulcer disease and was seen by GI who did an EGD that showed some esophagitis.  He was anemic at that admission to so there was a search for a source of bleed.  Nuclear medicine bleeding scan was unrevealing.  Patient was started on PPI and advised to follow-up outpatient.  He also had an elevated creatinine at the last admission.  There is suspicion for an AKI, but creatinine has stayed stable and is more likely a chronic kidney disease than an AKI.  Patient had proteinuria at the last admission as well and was advised to follow-up outpatient for that.  There was mention of CHF during the last admission and patient was advised to continue carvedilol and hydralazine.  He is not chronically on any diuretic.  Patient did follow-up with oncology regarding the anemia.  No laceration as stated above no source of bleed was found, patient had transfusion of 2 units.  He was diagnosed with iron deficiency anemia.  Oncologist recommends proceeding with IV iron infusions.  Oncology clinic was notified the patient's had a 9 pound weight loss, 2 falls,  pitting edema, and fatigue since his discharge.  Recommended going to the hospital for admission for failure to thrive, possible PEG tube placement, possible inpatient IV infusion.  I spoken with family and they are interested in feeding tube if necessary.  Family reports the patient is eating at home but very little.  They note that he has urinary frequency.  They are concerned about him dragging his right foot, and weakness in his right hand.  They report that this has been going on since before the last admission.  He has physical therapy but they report that that is not helping.  They have also noticed that he has been choking at mealtime.  Good news is that he is no longer complaining of stomach pain.  Family monitors his blood pressure and reports that its been uncontrolled at home despite his medications.  Their biggest concern is the weight loss at this point. They note that he has also been deconditioned since last admission, and is having generalized weakness that has resulted in 2 falls.   Unfortunately family  has left bedside by the time patient was admitted.  History gathered from them was over the phone.  The interpreter tablet cannot be located in the ER.  Of asked the nurse to notify me when it is available.  Patient speaks only [place].  Patient was not able to contribute to his history at this interview.  We will continue to pursue interpreter for further history.  In the ED Temp 97.9, heart rate 79, respiratory rate 19, blood pressure as high as 243/100, satting at 98% Patient was given labetalol, p.o. hydralazine, IV hydralazine Blood pressure improved to 169/92 No leukocytosis with a white blood cell count of 8.6, hemoglobin 10.8 Chemistry panel reveals a hypokalemia at 3.0, elevated creatinine at 2.37-stable from last admission BNP is slightly elevated at 246 Troponin stable at 15 and 16 UA shows proteinuria Ultrasound of right upper extremity and right lower extremity preliminary  report is negative for DVT Patient was typed and screened EKG shows a heart rate of 67, sinus rhythm, QTC 506, no ST changes CT head shows chronic cerebral atrophy, chronic bilateral basal ganglia infarcts, no acute intracranial abnormality Lipase is 23 Patient was given a 500 mL bolus of normal saline Admission requested for failure to thrive    Review of systems:    Review of Systems  Unable to perform ROS: Language     Past History of the following :    Past Medical History:  Diagnosis Date   Anemia    Cataract    Esophageal ulcer    Gastritis    proximal   Hiatal hernia    Hyperlipidemia    Intractable hiccups    Reflux esophagitis    Stroke (HCC)    r side deficits; September 2017   Upper GI bleed       Past Surgical History:  Procedure Laterality Date   BIOPSY  02/11/2021   Procedure: BIOPSY;  Surgeon: Clarene Essex, MD;  Location: [facility];  Service: Endoscopy;;   COLONOSCOPY     ESOPHAGOGASTRODUODENOSCOPY     ESOPHAGOGASTRODUODENOSCOPY N/A 03/04/2017   Procedure: ESOPHAGOGASTRODUODENOSCOPY (EGD);  Surgeon: Mauri Pole, MD;  Location: [facility] ENDOSCOPY;  Service: Endoscopy;  Laterality: N/A;   ESOPHAGOGASTRODUODENOSCOPY (EGD) WITH PROPOFOL N/A 02/11/2021   Procedure: ESOPHAGOGASTRODUODENOSCOPY (EGD) WITH PROPOFOL;  Surgeon: Clarene Essex, MD;  Location: [facility] ENDOSCOPY;  Service: Endoscopy;  Laterality: N/A;   UPPER GASTROINTESTINAL ENDOSCOPY        Social History:      Social History   Tobacco Use   Smoking status: Never   Smokeless tobacco: Never  Substance Use Topics   Alcohol use: No       Family History :     Family History  Problem Relation Age of Onset   Colon cancer Neg Hx    Esophageal cancer Neg Hx    Rectal cancer Neg Hx       Home Medications:   Prior to Admission medications   Medication Sig Start Date End Date Taking? Authorizing Provider  acetaminophen (TYLENOL) 500 MG tablet Take 1,000 mg by mouth every 6 (six) hours as  needed for mild pain.    [provider]  atorvastatin (LIPITOR) 20 MG tablet Take 20 mg by mouth daily. 01/12/21   [provider]  carvedilol (COREG) 6.25 MG tablet Take 6.25 mg by mouth 2 (two) times daily. 01/12/21   [provider]  feeding supplement, ENSURE ENLIVE, (ENSURE ENLIVE) LIQD Take 237 mLs by mouth 2 (two) times daily between meals.  Patient not taking: Reported on 02/10/2021 04/13/17   Modena Jansky, MD  metoCLOPramide (REGLAN) 5 MG tablet Take 5 mg by mouth 4 (four) times daily. 09/25/20   [provider]  pantoprazole (PROTONIX) 40 MG tablet Take 1 tablet (40 mg total) 2 (two) times daily by mouth. 05/10/17   Georgette Shell, MD     Allergies:    No Known Allergies   Physical Exam:   Vitals  Blood pressure (!) 169/90, pulse 93, temperature 97.9 F (36.6 C), temperature source Oral, resp. rate 16, height '5\' 9"'$  (1.753 m), weight 58.1 kg, SpO2 98 %.  1.  General: Patient lying right lateral decubitus in bed,  no acute distress   2. Psychiatric: Alert  and cooperative with exam   3. Neurologic: Patient is  non verbal during my interview, maintaining eye contact - but not responding - likely due to language barrier, face is symmetric, moves all 4 extremities voluntarily, Equal strength in upper extremities BL, moves both lower extremities voluntarily,  at baseline without acute deficits on limited exam   4. HEENMT:  Head is atraumatic, normocephalic, pupils reactive to light, neck is supple, trachea is midline, mucous membranes are moist   5. Respiratory : Lungs are clear to auscultation bilaterally without wheezing, rhonchi, rales, no cyanosis, no increase in work of breathing or accessory muscle use   6. Cardiovascular : Heart rate normal, rhythm is regular, no murmurs, rubs or gallops, peripheral edema in the lower extremities greater on right than left, peripheral pulses palpated   7. Gastrointestinal:  Abdomen is soft,  nondistended, nontender to palpation bowel sounds active, no masses or organomegaly palpated   8. Skin:  Skin is warm, dry and intact without rashes, acute lesions, or ulcers on limited exam   9.Musculoskeletal:  No acute deformities or trauma, no asymmetry in tone, peripheral pulses palpated, tenderness to palpation of right calf     Data Review:    CBC Recent Labs  Lab 02/26/21 1541  WBC 8.6  HGB 10.8*  HCT 35.2*  PLT 308  MCV 78.4*  MCH 24.1*  MCHC 30.7  RDW 22.9*  LYMPHSABS 2.1  MONOABS 0.4  EOSABS 0.3  BASOSABS 0.1   ------------------------------------------------------------------------------------------------------------------  Results for orders placed or performed during the hospital encounter of 02/26/21 (from the past 48 hour(s))  CBC with Differential     Status: Abnormal   Collection Time: 02/26/21  3:41 PM  Result Value Ref Range   WBC 8.6 4.0 - 10.5 K/uL   RBC 4.49 4.22 - 5.81 MIL/uL   Hemoglobin 10.8 (L) 13.0 - 17.0 g/dL   HCT 35.2 (L) 39.0 - 52.0 %   MCV 78.4 (L) 80.0 - 100.0 fL   MCH 24.1 (L) 26.0 - 34.0 pg   MCHC 30.7 30.0 - 36.0 g/dL   RDW 22.9 (H) 11.5 - 15.5 %   Platelets 308 150 - 400 K/uL   nRBC 0.0 0.0 - 0.2 %   Neutrophils Relative % 67 %   Neutro Abs 5.7 1.7 - 7.7 K/uL   Lymphocytes Relative 24 %   Lymphs Abs 2.1 0.7 - 4.0 K/uL   Monocytes Relative 5 %   Monocytes Absolute 0.4 0.1 - 1.0 K/uL   Eosinophils Relative 3 %   Eosinophils Absolute 0.3 0.0 - 0.5 K/uL   Basophils Relative 1 %   Basophils Absolute 0.1 0.0 - 0.1 K/uL   Immature Granulocytes 0 %   Abs Immature Granulocytes 0.02 0.00 - 0.07 K/uL  Ovalocytes PRESENT     Comment: Performed at [facility], [place] [address], [place], [place] [address]  Comprehensive metabolic panel     Status: Abnormal   Collection Time: 02/26/21  3:41 PM  Result Value Ref Range   Sodium 147 (H) 135 - 145 mmol/L   Potassium 3.0 (L) 3.5 - 5.1 mmol/L   Chloride 113 (H) 98 -  111 mmol/L   CO2 28 22 - 32 mmol/L   Glucose, Bld 105 (H) 70 - 99 mg/dL    Comment: Glucose reference range applies only to samples taken after fasting for at least 8 hours.   BUN 16 8 - 23 mg/dL   Creatinine, Ser 2.37 (H) 0.61 - 1.24 mg/dL   Calcium 8.0 (L) 8.9 - 10.3 mg/dL   Total Protein 5.0 (L) 6.5 - 8.1 g/dL   Albumin 2.3 (L) 3.5 - 5.0 g/dL   AST 24 15 - 41 U/L   ALT 16 0 - 44 U/L   Alkaline Phosphatase 64 38 - 126 U/L   Total Bilirubin 0.5 0.3 - 1.2 mg/dL   GFR, Estimated 27 (L) >60 mL/min    Comment: (NOTE) Calculated using the CKD-EPI Creatinine Equation (2021)    Anion gap 6 5 - 15    Comment: Performed at [facility], [place] [address] [place], [place] [address]  Brain natriuretic peptide     Status: Abnormal   Collection Time: 02/26/21  3:41 PM  Result Value Ref Range   B Natriuretic Peptide 246.6 (H) 0.0 - 100.0 pg/mL    Comment: Performed at [facility], [place] [address] [place], [place] [address]  Troponin I (High Sensitivity)     Status: None   Collection Time: 02/26/21  3:41 PM  Result Value Ref Range   Troponin I (High Sensitivity) 15 <18 ng/L    Comment: (NOTE) Elevated high sensitivity troponin I (hsTnI) values and significant  changes across serial measurements may suggest ACS but many other  chronic and acute conditions are known to elevate hsTnI results.  Refer to the "Links" section for chest pain algorithms and additional  guidance. Performed at [facility], [place] [address] [place], [place] [address]   Type and screen [place]     Status: None   Collection Time: 02/26/21  3:41 PM  Result Value Ref Range   ABO/RH(D) B POS    Antibody Screen NEG    Sample Expiration      03/01/2021,2359 Performed at [facility], [place] [address] [place], [place] [address]   Protime-INR     Status: None   Collection Time: 02/26/21  3:41 PM  Result Value Ref Range    Prothrombin Time 12.6 11.4 - 15.2 seconds   INR 1.0 0.8 - 1.2    Comment: (NOTE) INR goal varies based on device and disease states. Performed at [facility], [place] [address], [place], [place] [address]   Urinalysis, Routine w reflex microscopic     Status: Abnormal   Collection Time: 02/26/21  4:02 PM  Result Value Ref Range   Color, Urine YELLOW (A) YELLOW   APPearance CLEAR (A) CLEAR   Specific Gravity, Urine 1.015 1.005 - 1.030   pH 7.0 5.0 - 8.0   Glucose, UA 250 (A) NEGATIVE mg/dL   Hgb urine dipstick MODERATE (A) NEGATIVE   Bilirubin Urine NEGATIVE NEGATIVE   Ketones, ur NEGATIVE NEGATIVE mg/dL   Protein, ur >300 (A)  NEGATIVE mg/dL   Nitrite NEGATIVE NEGATIVE   Leukocytes,Ua NEGATIVE NEGATIVE   RBC / HPF 6-10 0 - 5 RBC/hpf   WBC, UA 0-5 0 - 5 WBC/hpf   Bacteria, UA NONE SEEN NONE SEEN   Mucus PRESENT    Hyaline Casts, UA PRESENT     Comment: Performed at [facility], [place] [address], [place], [place] [address]  Troponin I (High Sensitivity)     Status: None   Collection Time: 02/26/21  5:22 PM  Result Value Ref Range   Troponin I (High Sensitivity) 16 <18 ng/L    Comment: (NOTE) Elevated high sensitivity troponin I (hsTnI) values and significant  changes across serial measurements may suggest ACS but many other  chronic and acute conditions are known to elevate hsTnI results.  Refer to the "Links" section for chest pain algorithms and additional  guidance. Performed at [facility], [place] [address] [place], [place] [address]   Lipase, blood     Status: None   Collection Time: 02/26/21  6:20 PM  Result Value Ref Range   Lipase 23 11 - 51 U/L    Comment: Performed at [facility], [place] [address] [place], [place] [address]    Chemistries  Recent Labs  Lab 02/26/21 1541  NA 147*  K 3.0*  CL 113*  CO2 28  GLUCOSE 105*  BUN 16  CREATININE 2.37*  CALCIUM 8.0*  AST 24  ALT 16   ALKPHOS 64  BILITOT 0.5   ------------------------------------------------------------------------------------------------------------------  ------------------------------------------------------------------------------------------------------------------ GFR: Estimated Creatinine Clearance: 19.4 mL/min (A) (by C-G formula based on SCr of 2.37 mg/dL (H)). Liver Function Tests: Recent Labs  Lab 02/26/21 1541  AST 24  ALT 16  ALKPHOS 64  BILITOT 0.5  PROT 5.0*  ALBUMIN 2.3*   Recent Labs  Lab 02/26/21 1820  LIPASE 23   No results for input(s): AMMONIA in the last 168 hours. Coagulation Profile: Recent Labs  Lab 02/26/21 1541  INR 1.0   Cardiac Enzymes: No results for input(s): CKTOTAL, CKMB, CKMBINDEX, TROPONINI in the last 168 hours. BNP (last 3 results) No results for input(s): PROBNP in the last 8760 hours. HbA1C: No results for input(s): HGBA1C in the last 72 hours. CBG: No results for input(s): GLUCAP in the last 168 hours. Lipid Profile: No results for input(s): CHOL, HDL, LDLCALC, TRIG, CHOLHDL, LDLDIRECT in the last 72 hours. Thyroid Function Tests: No results for input(s): TSH, T4TOTAL, FREET4, T3FREE, THYROIDAB in the last 72 hours. Anemia Panel: No results for input(s): VITAMINB12, FOLATE, FERRITIN, TIBC, IRON, RETICCTPCT in the last 72 hours.  --------------------------------------------------------------------------------------------------------------- Urine analysis:    Component Value Date/Time   COLORURINE YELLOW (A) 02/26/2021 1602   APPEARANCEUR CLEAR (A) 02/26/2021 1602   LABSPEC 1.015 02/26/2021 1602   PHURINE 7.0 02/26/2021 1602   GLUCOSEU 250 (A) 02/26/2021 1602   HGBUR MODERATE (A) 02/26/2021 1602   BILIRUBINUR NEGATIVE 02/26/2021 1602   KETONESUR NEGATIVE 02/26/2021 1602   PROTEINUR >300 (A) 02/26/2021 1602   NITRITE NEGATIVE 02/26/2021 1602   LEUKOCYTESUR NEGATIVE 02/26/2021 1602      Imaging Results:    CT ABDOMEN PELVIS WO  CONTRAST  Result Date: 02/26/2021 CLINICAL DATA:  Weakness, weight loss and poor oral intake. EXAM: CT ABDOMEN AND PELVIS WITHOUT CONTRAST TECHNIQUE: Multidetector CT imaging of the abdomen and pelvis was performed following the standard protocol without IV contrast. COMPARISON:  February 10, 2021 FINDINGS: Lower chest: Mild to moderate severity areas of atelectasis and/or infiltrate  are seen within the bilateral lung bases. Small bilateral pleural effusions are seen. A small pericardial effusion is present. Hepatobiliary: No focal liver abnormality is seen. No gallstones, gallbladder wall thickening, or biliary dilatation. Pancreas: Unremarkable. No pancreatic ductal dilatation or surrounding inflammatory changes. Spleen: Normal in size without focal abnormality. Adrenals/Urinary Tract: Adrenal glands are unremarkable. Kidneys are normal, without renal calculi, focal lesion, or hydronephrosis. Bladder is unremarkable. Stomach/Bowel: Stomach is within normal limits. Appendix appears normal. No evidence of bowel wall thickening, distention, or inflammatory changes. Vascular/Lymphatic: Aortic atherosclerosis. No enlarged abdominal or pelvic lymph nodes. Reproductive: The prostate gland is mildly enlarged. Other: No abdominal wall hernia or abnormality. No abdominopelvic ascites. Musculoskeletal: A total right hip replacement is seen with associated streak artifact and subsequently limited evaluation of the adjacent osseous and soft tissue structures. Degenerative changes seen throughout the lumbar spine. IMPRESSION: 1. Mild to moderate severity bibasilar atelectasis and/or infiltrate with small bilateral pleural effusions. 2. Small pericardial effusion. 3. Mildly enlarged prostate gland. 4. Total right hip replacement. 5. Aortic atherosclerosis. Aortic Atherosclerosis (ICD10-I70.0). Electronically Signed   By: Virgina Norfolk M.D.   On: 02/26/2021 18:35   CT HEAD WO CONTRAST (5MM)  Result Date: 02/26/2021 CLINICAL  DATA:  Weakness, poor oral intake and 10 pound weight loss in 2 days. EXAM: CT HEAD WITHOUT CONTRAST TECHNIQUE: Contiguous axial images were obtained from the base of the skull through the vertex without intravenous contrast. COMPARISON:  None. FINDINGS: Brain: There is mild to moderate severity cerebral atrophy with widening of the extra-axial spaces and ventricular dilatation. There are areas of decreased attenuation within the white matter tracts of the supratentorial brain, consistent with microvascular disease changes. Chronic bilateral basal ganglia lacunar infarcts are noted. Vascular: No hyperdense vessel or unexpected calcification. Skull: Normal. Negative for fracture or focal lesion. Sinuses/Orbits: No acute finding. 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 IMPRESSION: 1. Generalized cerebral atrophy. 2. Chronic bilateral basal ganglia lacunar infarcts. 3. No acute intracranial abnormality. Electronically Signed   By: Virgina Norfolk M.D.   On: 02/26/2021 18:37   VAS [place] LOWER EXTREMITY VENOUS (DVT) (ONLY [facility])  Result Date: 02/26/2021  Lower Venous DVT Study Patient Name:  PISTOL [facility]  Date of Exam:   02/26/2021 Medical Rec #: [id number]      Accession #:    [id number] Date of Birth: 1935-01-16     Patient Gender: M Patient Age:   47 years Exam Location:  [facility] Procedure:      VAS [place] LOWER EXTREMITY VENOUS (DVT) Referring Phys: Gareth Morgan --------------------------------------------------------------------------------  Indications: Swelling.  Risk Factors: Cancer. Limitations: Poor ultrasound/tissue interface. Comparison Study: No prior studies. Performing Technologist: Oliver Hum RVT  Examination Guidelines: A complete evaluation includes B-mode imaging, spectral Doppler, color Doppler, and power Doppler as needed of all accessible portions of each vessel. Bilateral testing is considered an integral part of a complete examination. Limited examinations  for reoccurring indications may be performed as noted. The reflux portion of the exam is performed with the patient in reverse Trendelenburg.  +---------+---------------+---------+-----------+----------+--------------+ RIGHT    CompressibilityPhasicitySpontaneityPropertiesThrombus Aging +---------+---------------+---------+-----------+----------+--------------+ CFV      Full           Yes      Yes                                 +---------+---------------+---------+-----------+----------+--------------+ SFJ  Full                                                        +---------+---------------+---------+-----------+----------+--------------+ FV Prox  Full                                                        +---------+---------------+---------+-----------+----------+--------------+ FV Mid   Full                                                        +---------+---------------+---------+-----------+----------+--------------+ FV DistalFull                                                        +---------+---------------+---------+-----------+----------+--------------+ PFV      Full                                                        +---------+---------------+---------+-----------+----------+--------------+ POP      Full           Yes      Yes                                 +---------+---------------+---------+-----------+----------+--------------+ PTV      Full                                                        +---------+---------------+---------+-----------+----------+--------------+ PERO     Full                                                        +---------+---------------+---------+-----------+----------+--------------+   +----+---------------+---------+-----------+----------+--------------+ LEFTCompressibilityPhasicitySpontaneityPropertiesThrombus Aging  +----+---------------+---------+-----------+----------+--------------+ CFV Full           Yes      Yes                                 +----+---------------+---------+-----------+----------+--------------+    Summary: RIGHT: - There is no evidence of deep vein thrombosis in the lower extremity.  - No cystic structure found in the popliteal fossa.  LEFT: - No evidence of common femoral vein obstruction.  *See table(s) above for measurements and observations.    Preliminary    UE Venous Duplex ([facility] ONLY)  Result Date: 02/26/2021 UPPER VENOUS STUDY  Patient Name:  CLANCY [facility]  Date of Exam:   02/26/2021 Medical Rec #: [id number]      Accession #:    [id number] Date of Birth: [birth year]     Patient Gender: M Patient Age:   27 years Exam Location:  [facility] Procedure:      VAS [place] UPPER EXTREMITY VENOUS DUPLEX Referring Phys: Gareth Morgan --------------------------------------------------------------------------------  Indications: Swelling Risk Factors: Cancer. Comparison Study: No prior studies. Performing Technologist: Oliver Hum RVT  Examination Guidelines: A complete evaluation includes B-mode imaging, spectral Doppler, color Doppler, and power Doppler as needed of all accessible portions of each vessel. Bilateral testing is considered an integral part of a complete examination. Limited examinations for reoccurring indications may be performed as noted.  Right Findings: +----------+------------+---------+-----------+----------+-------+ RIGHT     CompressiblePhasicitySpontaneousPropertiesSummary +----------+------------+---------+-----------+----------+-------+ IJV           Full       Yes       Yes                      +----------+------------+---------+-----------+----------+-------+ Subclavian    Full       Yes       Yes                      +----------+------------+---------+-----------+----------+-------+ Axillary      Full       Yes       Yes                       +----------+------------+---------+-----------+----------+-------+ Brachial      Full       Yes       Yes                      +----------+------------+---------+-----------+----------+-------+ Radial        Full                                          +----------+------------+---------+-----------+----------+-------+ Ulnar         Full                                          +----------+------------+---------+-----------+----------+-------+ Cephalic      Full                                          +----------+------------+---------+-----------+----------+-------+ Basilic       Full                                          +----------+------------+---------+-----------+----------+-------+  Left Findings: +----------+------------+---------+-----------+----------+-------+ LEFT      CompressiblePhasicitySpontaneousPropertiesSummary +----------+------------+---------+-----------+----------+-------+ Subclavian    Full       Yes       Yes                      +----------+------------+---------+-----------+----------+-------+  Summary:  Right: No evidence of deep vein thrombosis in the upper extremity. No evidence of superficial vein thrombosis in the  upper extremity.  Left: No evidence of thrombosis in the subclavian.  *See table(s) above for measurements and observations.    Preliminary        Assessment & Plan:    Active Problems:   Failure to thrive in adult   Failure to thrive, protein cal malnutrition, and unintentional weight loss 9 pound weight loss in 2 days (measured by home health nurse), Albumin 2.3, 2 falls and generalized weakness resulting in difficulty with ambulation Upper and lower GI scopes done recently Consult PT/OT eval and treat Continue feeding supplement Consultation with gen surg for possible PEG tube With fatigue - Check TSH Continue to monitor Dysphagia Family reports choking at meal time Speech therapy  consult Soft diet for now Hypertensive urgency BP as high as 240s over 100s Trop normal x 2 Cr stable Labetalol, PO hydralazine, and IV hydralazine in ED Cardene ordered, but not started as BP improved to 169/92. Continue to monitor. If BP remains stable, patient can xfer to tele Continue home doses of carvedilol and hydralazine Pleural effusions Small, asymptomatic Continue to monitor Third spacing is likely related to low albumin, as patient is not clinically fluid overloaded. Continue to push PO intake. Consider albumin infusion with lasix if patient becomes symptomatic Hypokalemia Replace Recheck Check mag and phos in the AM Proteinuria Demonstrated on Uas since 02/10/21 Follow up outpatient Chronic iron deficiency anemia Outpatient note from heme/onc rec's possible inpatient iron infusion Consult heme/onc    DVT Prophylaxis-  heparin - SCDs   AM Labs Ordered, also please review Full Orders  Family Communication: Admission, patients condition and plan of care including tests being ordered have been discussed with the patient and patient son and daughter in law via phone who indicate understanding and agree with the plan and Code Status.  Code Status:  Full  Admission status: Inpatient :The appropriate admission status for this patient is INPATIENT. Inpatient status is judged to be reasonable and necessary in order to provide the required intensity of service to ensure the patient's safety. The patient's presenting symptoms, physical exam findings, and initial radiographic and laboratory data in the context of their chronic comorbidities is felt to place them at high risk for further clinical deterioration. Furthermore, it is not anticipated that the patient will be medically stable for discharge from the hospital within 2 midnights of admission. The following factors support the admission status of inpatient.     The patient's presenting symptoms include weight loss,  fatigue. The worrisome physical exam findings include . The initial radiographic and laboratory data are worrisome because of low albumin, hypokalemia. The chronic co-morbidities include HTN, CVA, HLD, GERD       * I certify that at the point of admission it is my clinical judgment that the patient will require inpatient hospital care spanning beyond 2 midnights from the point of admission due to high intensity of service, high risk for further deterioration and high frequency of surveillance required.*  Time spent in minutes : Aldan

## 2021-02-26 NOTE — ED Provider Notes (Signed)
[facility] DEPT Provider Note   CSN: [id number] Arrival date & time: 02/26/21  1431     History Chief Complaint  Patient presents with   Weakness    Logan [name] is a 86 y.o. male.  HPI     86 year old male with a history of gastric ulcer, GI bleed, chronic iron deficiency anemia, chronic diastolic heart failure, hypertension, SVT, CVA ,with history of recent admission August 16 to August 22 for AKI, suspected to be CKD stage III, as well as abdominal pain. Son reports just taking one BP medications (the only one on his list) Right sided abdominal pain, has been going on and off since last admission One episode of vomiting last night Urinating frequently Low energy, has not been getting up or walking, not standing 1wk of leg swelling and arm swelling  137lb 2 days ago and 128lb today   History taken from patient with Actor, and from son Past Medical History:  Diagnosis Date   Anemia    Cataract    Esophageal ulcer    Gastritis    proximal   Hiatal hernia    Hyperlipidemia    Intractable hiccups    Reflux esophagitis    Stroke ([place])    r side deficits; September 2017   Upper GI bleed     Patient Active Problem List   Diagnosis Date Noted   Failure to thrive in adult 02/26/2021   Proteinuria 02/12/2021   Intractable abdominal pain 02/11/2021   AKI (acute kidney injury) ([place]) 02/11/2021   Anemia 02/11/2021   CKD (chronic kidney disease), stage III ([place]) 02/11/2021   Benign essential HTN 02/11/2021   Acute on chronic renal insufficiency    Hypoalbuminemia    Acute systolic CHF (congestive heart failure) (HCC)    S/P total right hip arthroplasty 04/30/2020   Abnormal CT scan, esophagus    Acute upper GI bleed 05/06/2017   Hyperkalemia 05/06/2017   Malnutrition of moderate degree 04/13/2017   Gastroesophageal reflux disease with esophagitis    Hyperglycemia 04/11/2017   Malnutrition of mild degree ([place])  04/11/2017   Intractable vomiting with nausea    Physical deconditioning    Iron deficiency anemia    Upper GI bleed    Esophageal ulcer with bleeding    Cardiomegaly    Chronic diastolic CHF (congestive heart failure) (HCC)    Intractable hiccups    Cerebrovascular accident (CVA) ([place])    SVT (supraventricular tachycardia) ([place])    Hematemesis 03/03/2017    Past Surgical History:  Procedure Laterality Date   BIOPSY  02/11/2021   Procedure: BIOPSY;  Surgeon: Clarene Essex, MD;  Location: [facility] ENDOSCOPY;  Service: Endoscopy;;   COLONOSCOPY     ESOPHAGOGASTRODUODENOSCOPY     ESOPHAGOGASTRODUODENOSCOPY N/A 03/04/2017   Procedure: ESOPHAGOGASTRODUODENOSCOPY (EGD);  Surgeon: Mauri Pole, MD;  Location: [facility] ENDOSCOPY;  Service: Endoscopy;  Laterality: N/A;   ESOPHAGOGASTRODUODENOSCOPY (EGD) WITH PROPOFOL N/A 02/11/2021   Procedure: ESOPHAGOGASTRODUODENOSCOPY (EGD) WITH PROPOFOL;  Surgeon: Clarene Essex, MD;  Location: [facility] ENDOSCOPY;  Service: Endoscopy;  Laterality: N/A;   UPPER GASTROINTESTINAL ENDOSCOPY         Family History  Problem Relation Age of Onset   Colon cancer Neg Hx    Esophageal cancer Neg Hx    Rectal cancer Neg Hx     Social History   Tobacco Use   Smoking status: Never   Smokeless tobacco: Never  Vaping Use   Vaping Use: Never used  Substance Use Topics  Alcohol use: No   Drug use: No    Home Medications Prior to Admission medications   Medication Sig Start Date End Date Taking? Authorizing Provider  acetaminophen (TYLENOL) 500 MG tablet Take 1,000 mg by mouth every 6 (six) hours as needed for mild pain.   Yes [provider]  acyclovir (ZOVIRAX) 200 MG capsule Take 200 mg by mouth 3 (three) times daily. 02/17/21  Yes [provider]  atorvastatin (LIPITOR) 20 MG tablet Take 20 mg by mouth daily. 01/12/21  Yes [provider]  carvedilol (COREG) 6.25 MG tablet Take 6.25 mg by mouth 2 (two) times daily. 01/12/21  Yes [provider]  lactulose (CHRONULAC) 10 GM/15ML solution Take 10 g by mouth daily as needed for moderate constipation. 02/23/21  Yes [provider]  pantoprazole (PROTONIX) 40 MG tablet Take 1 tablet (40 mg total) 2 (two) times daily by mouth. Patient taking differently: Take 40 mg by mouth daily. 05/10/17  Yes Georgette Shell, MD  feeding supplement, ENSURE ENLIVE, (ENSURE ENLIVE) LIQD Take 237 mLs by mouth 2 (two) times daily between meals. Patient not taking: No sig reported 04/13/17   Modena Jansky, MD    Allergies    Patient has no known allergies.  Review of Systems   Review of Systems  Constitutional:  Positive for appetite change, fatigue and unexpected weight change. Negative for fever.  HENT:  Negative for sore throat.   Eyes:  Negative for visual disturbance.  Respiratory:  Negative for shortness of breath.   Cardiovascular:  Positive for leg swelling. Negative for chest pain.  Gastrointestinal:  Positive for abdominal pain, nausea and vomiting.  Genitourinary:  Negative for difficulty urinating.  Musculoskeletal:  Negative for back pain and neck stiffness.  Skin:  Negative for rash.  Neurological:  Positive for dizziness and light-headedness. Negative for syncope, facial asymmetry, weakness, numbness and headaches.   Physical Exam Updated Vital Signs BP (!) 136/92   Pulse 99   Temp 97.9 F (36.6 C) (Oral)   Resp 18   Ht '5\' 9"'$  (1.753 m)   Wt 58.1 kg   SpO2 95%   BMI 18.92 kg/m   Physical Exam Vitals and nursing note reviewed.  Constitutional:      General: He is not in acute distress.    Appearance: Normal appearance. He is well-developed. He is not ill-appearing or diaphoretic.  HENT:     Head: Normocephalic and atraumatic.  Eyes:     Extraocular Movements: Extraocular movements intact.     Conjunctiva/sclera: Conjunctivae normal.     Pupils: Pupils are equal, round, and reactive to light.  Cardiovascular:     Rate and Rhythm: Normal  rate and regular rhythm.     Pulses: Normal pulses.     Heart sounds: Normal heart sounds. No murmur heard.   No friction rub. No gallop.  Pulmonary:     Effort: Pulmonary effort is normal. No respiratory distress.     Breath sounds: Normal breath sounds. No wheezing or rales.  Abdominal:     General: There is no distension.     Palpations: Abdomen is soft.     Tenderness: There is abdominal tenderness (RLQ). There is no guarding.  Musculoskeletal:        General: Swelling (RLE, RUE) present. No tenderness.     Cervical back: Normal range of motion.  Skin:    General: Skin is warm and dry.     Findings: No erythema or rash.  Neurological:  General: No focal deficit present.     Mental Status: He is alert and oriented to person, place, and time.     GCS: GCS eye subscore is 4. GCS verbal subscore is 5. GCS motor subscore is 6.     Cranial Nerves: No cranial nerve deficit, dysarthria or facial asymmetry.     Sensory: No sensory deficit.     Motor: No weakness or tremor.     Gait: Gait normal.    ED Results / Procedures / Treatments   Labs (all labs ordered are listed, but only abnormal results are displayed) Labs Reviewed  CBC WITH DIFFERENTIAL/PLATELET - Abnormal; Notable for the following components:      Result Value   Hemoglobin 10.8 (*)    HCT 35.2 (*)    MCV 78.4 (*)    MCH 24.1 (*)    RDW 22.9 (*)    All other components within normal limits  COMPREHENSIVE METABOLIC PANEL - Abnormal; Notable for the following components:   Sodium 147 (*)    Potassium 3.0 (*)    Chloride 113 (*)    Glucose, Bld 105 (*)    Creatinine, Ser 2.37 (*)    Calcium 8.0 (*)    Total Protein 5.0 (*)    Albumin 2.3 (*)    GFR, Estimated 27 (*)    All other components within normal limits  BRAIN NATRIURETIC PEPTIDE - Abnormal; Notable for the following components:   B Natriuretic Peptide 246.6 (*)    All other components within normal limits  URINALYSIS, ROUTINE W REFLEX MICROSCOPIC -  Abnormal; Notable for the following components:   Color, Urine YELLOW (*)    APPearance CLEAR (*)    Glucose, UA 250 (*)    Hgb urine dipstick MODERATE (*)    Protein, ur >300 (*)    All other components within normal limits  URINE CULTURE  PROTIME-INR  LIPASE, BLOOD  TYPE AND SCREEN  TROPONIN I (HIGH SENSITIVITY)  TROPONIN I (HIGH SENSITIVITY)    EKG None  Radiology CT ABDOMEN PELVIS WO CONTRAST  Result Date: 02/26/2021 CLINICAL DATA:  Weakness, weight loss and poor oral intake. EXAM: CT ABDOMEN AND PELVIS WITHOUT CONTRAST TECHNIQUE: Multidetector CT imaging of the abdomen and pelvis was performed following the standard protocol without IV contrast. COMPARISON:  February 10, 2021 FINDINGS: Lower chest: Mild to moderate severity areas of atelectasis and/or infiltrate are seen within the bilateral lung bases. Small bilateral pleural effusions are seen. A small pericardial effusion is present. Hepatobiliary: No focal liver abnormality is seen. No gallstones, gallbladder wall thickening, or biliary dilatation. Pancreas: Unremarkable. No pancreatic ductal dilatation or surrounding inflammatory changes. Spleen: Normal in size without focal abnormality. Adrenals/Urinary Tract: Adrenal glands are unremarkable. Kidneys are normal, without renal calculi, focal lesion, or hydronephrosis. Bladder is unremarkable. Stomach/Bowel: Stomach is within normal limits. Appendix appears normal. No evidence of bowel wall thickening, distention, or inflammatory changes. Vascular/Lymphatic: Aortic atherosclerosis. No enlarged abdominal or pelvic lymph nodes. Reproductive: The prostate gland is mildly enlarged. Other: No abdominal wall hernia or abnormality. No abdominopelvic ascites. Musculoskeletal: A total right hip replacement is seen with associated streak artifact and subsequently limited evaluation of the adjacent osseous and soft tissue structures. Degenerative changes seen throughout the lumbar spine. IMPRESSION:  1. Mild to moderate severity bibasilar atelectasis and/or infiltrate with small bilateral pleural effusions. 2. Small pericardial effusion. 3. Mildly enlarged prostate gland. 4. Total right hip replacement. 5. Aortic atherosclerosis. Aortic Atherosclerosis (ICD10-I70.0). Electronically Signed   By: Hoover Browns  Houston M.D.   On: 02/26/2021 18:35   CT HEAD WO CONTRAST (5MM)  Result Date: 02/26/2021 CLINICAL DATA:  Weakness, poor oral intake and 10 pound weight loss in 2 days. EXAM: CT HEAD WITHOUT CONTRAST TECHNIQUE: Contiguous axial images were obtained from the base of the skull through the vertex without intravenous contrast. COMPARISON:  None. FINDINGS: Brain: There is mild to moderate severity cerebral atrophy with widening of the extra-axial spaces and ventricular dilatation. There are areas of decreased attenuation within the white matter tracts of the supratentorial brain, consistent with microvascular disease changes. Chronic bilateral basal ganglia lacunar infarcts are noted. Vascular: No hyperdense vessel or unexpected calcification. Skull: Normal. Negative for fracture or focal lesion. Sinuses/Orbits: No acute finding. 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 IMPRESSION: 1. Generalized cerebral atrophy. 2. Chronic bilateral basal ganglia lacunar infarcts. 3. No acute intracranial abnormality. Electronically Signed   By: Virgina Norfolk M.D.   On: 02/26/2021 18:37   VAS [place] LOWER EXTREMITY VENOUS (DVT) (ONLY [facility] & [facility])  Result Date: 02/26/2021  Lower Venous DVT Study Patient Name:  RALPHIE [facility]  Date of Exam:   02/26/2021 Medical Rec #: [id number]      Accession #:    [id number] Date of Birth: 13-May-1935     Patient Gender: M Patient Age:   26 years Exam Location:  [facility] Procedure:      VAS [place] LOWER EXTREMITY VENOUS (DVT) Referring Phys: Gareth Morgan --------------------------------------------------------------------------------  Indications: Swelling.  Risk Factors:  Cancer. Limitations: Poor ultrasound/tissue interface. Comparison Study: No prior studies. Performing Technologist: Oliver Hum RVT  Examination Guidelines: A complete evaluation includes B-mode imaging, spectral Doppler, color Doppler, and power Doppler as needed of all accessible portions of each vessel. Bilateral testing is considered an integral part of a complete examination. Limited examinations for reoccurring indications may be performed as noted. The reflux portion of the exam is performed with the patient in reverse Trendelenburg.  +---------+---------------+---------+-----------+----------+--------------+ RIGHT    CompressibilityPhasicitySpontaneityPropertiesThrombus Aging +---------+---------------+---------+-----------+----------+--------------+ CFV      Full           Yes      Yes                                 +---------+---------------+---------+-----------+----------+--------------+ SFJ      Full                                                        +---------+---------------+---------+-----------+----------+--------------+ FV Prox  Full                                                        +---------+---------------+---------+-----------+----------+--------------+ FV Mid   Full                                                        +---------+---------------+---------+-----------+----------+--------------+ FV DistalFull                                                        +---------+---------------+---------+-----------+----------+--------------+  PFV      Full                                                        +---------+---------------+---------+-----------+----------+--------------+ POP      Full           Yes      Yes                                 +---------+---------------+---------+-----------+----------+--------------+ PTV      Full                                                         +---------+---------------+---------+-----------+----------+--------------+ PERO     Full                                                        +---------+---------------+---------+-----------+----------+--------------+   +----+---------------+---------+-----------+----------+--------------+ LEFTCompressibilityPhasicitySpontaneityPropertiesThrombus Aging +----+---------------+---------+-----------+----------+--------------+ CFV Full           Yes      Yes                                 +----+---------------+---------+-----------+----------+--------------+    Summary: RIGHT: - There is no evidence of deep vein thrombosis in the lower extremity.  - No cystic structure found in the popliteal fossa.  LEFT: - No evidence of common femoral vein obstruction.  *See table(s) above for measurements and observations.    Preliminary    UE Venous Duplex ([facility] and [facility] ONLY)  Result Date: 02/26/2021 UPPER VENOUS STUDY  Patient Name:  BOLIVAR [facility]  Date of Exam:   02/26/2021 Medical Rec #: [id number]      Accession #:    [id number] Date of Birth: 1935-01-07     Patient Gender: M Patient Age:   53 years Exam Location:  [facility] Procedure:      VAS [place] UPPER EXTREMITY VENOUS DUPLEX Referring Phys: Gareth Morgan --------------------------------------------------------------------------------  Indications: Swelling Risk Factors: Cancer. Comparison Study: No prior studies. Performing Technologist: Oliver Hum RVT  Examination Guidelines: A complete evaluation includes B-mode imaging, spectral Doppler, color Doppler, and power Doppler as needed of all accessible portions of each vessel. Bilateral testing is considered an integral part of a complete examination. Limited examinations for reoccurring indications may be performed as noted.  Right Findings: +----------+------------+---------+-----------+----------+-------+ RIGHT     CompressiblePhasicitySpontaneousPropertiesSummary  +----------+------------+---------+-----------+----------+-------+ IJV           Full       Yes       Yes                      +----------+------------+---------+-----------+----------+-------+ Subclavian    Full       Yes       Yes                      +----------+------------+---------+-----------+----------+-------+  Axillary      Full       Yes       Yes                      +----------+------------+---------+-----------+----------+-------+ Brachial      Full       Yes       Yes                      +----------+------------+---------+-----------+----------+-------+ Radial        Full                                          +----------+------------+---------+-----------+----------+-------+ Ulnar         Full                                          +----------+------------+---------+-----------+----------+-------+ Cephalic      Full                                          +----------+------------+---------+-----------+----------+-------+ Basilic       Full                                          +----------+------------+---------+-----------+----------+-------+  Left Findings: +----------+------------+---------+-----------+----------+-------+ LEFT      CompressiblePhasicitySpontaneousPropertiesSummary +----------+------------+---------+-----------+----------+-------+ Subclavian    Full       Yes       Yes                      +----------+------------+---------+-----------+----------+-------+  Summary:  Right: No evidence of deep vein thrombosis in the upper extremity. No evidence of superficial vein thrombosis in the upper extremity.  Left: No evidence of thrombosis in the subclavian.  *See table(s) above for measurements and observations.    Preliminary     Procedures Procedures   Medications Ordered in ED Medications  nicardipine (CARDENE) '20mg'$  in 0.86% saline 235m IV infusion (0.1 mg/ml) (5 mg/hr Intravenous New Bag/Given 02/26/21  2342)  labetalol (NORMODYNE) injection 10 mg (10 mg Intravenous Given 02/26/21 1703)  hydrALAZINE (APRESOLINE) tablet 25 mg (25 mg Oral Given 02/26/21 1816)  sodium chloride 0.9 % bolus 500 mL (0 mLs Intravenous Stopped 02/26/21 2218)  hydrALAZINE (APRESOLINE) injection 10 mg (10 mg Intravenous Given 02/26/21 1957)    ED Course  I have reviewed the triage vital signs and the nursing notes.  Pertinent labs & imaging results that were available during my care of the patient were reviewed by me and considered in my medical decision making (see chart for details).    MDM Rules/Calculators/A&P                             86year old male with a history of gastric ulcer, GI bleed, chronic iron deficiency anemia, chronic diastolic heart failure, hypertension, SVT, CVA ,with history of recent admission August 16 to August 22 for AKI, suspected to be CKD stage III, as well as abdominal pain who presents with concern for decreased appetite,  weight loss, abdominal pain, right arm and leg swelling, generalized weakness and weight loss.  Anemia stable around 10 from previous admission (during which he was transfused for hgb 6.4.) Labs show stable CKD, hypernatremia/hypokalemia.  Suspect dehydration related.    Regarding abdominal pain, CT abdomen pelvis shows no acute findings, does show pleural and pericardial effusion. UA without signs of pyelonephritis.  Given age, generalized weakness, dizziness, elevated blood pressures, vomiting CT head completed to screen for ICH shows no acute findings. Does not have focal abnormalities on neurologic exam to suggest acute CVA.  Has had unsteady gait for months.  Blood pressures elevated [id number] systolic. Given labetalol and hydralazine.  It appears he is only on carvedilol for blood pressures at this time.  After receiving labetalol and hydralazine, his blood pressures continue to increase to [id number] systolic.  He was given additional hydralazine, and consulted hospitalist  for admission for hypertensive urgency.   Final Clinical Impression(s) / ED Diagnoses Final diagnoses:  Hypertensive urgency    Rx / DC Orders ED Discharge Orders     None        Gareth Morgan, MD 02/27/21 [phone]

## 2021-02-26 NOTE — ED Notes (Signed)
MD Zierle-Ghosh notified of recent pt BPs of 179/94, 182/85, 167/82. MD advised that RN should continue to hold IV Cardene for now.

## 2021-02-26 NOTE — Progress Notes (Unsigned)
Received call from [facility] physical therapist with [facility] [phone]) requesting to update MD on pt condition prior to new pt apt today.  States on Monday 02/23/21 pt weight was 137.5 lbs and today pt weight is 128.1 lbs.  States pt has fallen twice this week and is experiencing bilateral 2+ pitting edema bilateral legs and in the right arm.  PT states PCP Dr. Mellody Drown who advised pt be seen in the ED ASAP for further evaluation and treatment.  States pt declined to go to the ED and stated he would f/u with Dr. Lindi Adie today.  RN will alert MD of pt status.

## 2021-02-26 NOTE — ED Notes (Signed)
MD notified that pt monitor showing Afib with normal heart rate. EKG shot and exported to chart.

## 2021-02-26 NOTE — ED Notes (Signed)
MD notified of pt BP, Cardene started per MD orders.

## 2021-02-26 NOTE — ED Notes (Addendum)
MD notified of lump on pt's forehead. Consistent with frontal scalp lipoma noted on CT results. RN circled lump to assess for growth. Lump not red or draining. Pt not complaining of pain in the area.

## 2021-02-27 ENCOUNTER — Telehealth: Payer: Self-pay | Admitting: Hematology and Oncology

## 2021-02-27 LAB — COMPREHENSIVE METABOLIC PANEL
ALT: 16 U/L (ref 0–44)
AST: 23 U/L (ref 15–41)
Albumin: 2.3 g/dL — ABNORMAL LOW (ref 3.5–5.0)
Alkaline Phosphatase: 61 U/L (ref 38–126)
Anion gap: 6 (ref 5–15)
BUN: 16 mg/dL (ref 8–23)
CO2: 24 mmol/L (ref 22–32)
Calcium: 7.7 mg/dL — ABNORMAL LOW (ref 8.9–10.3)
Chloride: 112 mmol/L — ABNORMAL HIGH (ref 98–111)
Creatinine, Ser: 2.19 mg/dL — ABNORMAL HIGH (ref 0.61–1.24)
GFR, Estimated: 29 mL/min — ABNORMAL LOW (ref >60)
Glucose, Bld: 124 mg/dL — ABNORMAL HIGH (ref 70–99)
Potassium: 3.7 mmol/L (ref 3.5–5.1)
Sodium: 142 mmol/L (ref 135–145)
Total Bilirubin: 0.7 mg/dL (ref 0.3–1.2)
Total Protein: 4.9 g/dL — ABNORMAL LOW (ref 6.5–8.1)

## 2021-02-27 LAB — HEPATITIS PANEL, ACUTE
HCV Ab: NONREACTIVE
Hep A IgM: NONREACTIVE
Hep B C IgM: NONREACTIVE
Hepatitis B Surface Ag: NONREACTIVE

## 2021-02-27 LAB — TSH: TSH: 6.135 u[IU]/mL — ABNORMAL HIGH (ref 0.350–4.500)

## 2021-02-27 LAB — CBC WITH DIFFERENTIAL/PLATELET
Abs Immature Granulocytes: 0.08 10*3/uL — ABNORMAL HIGH (ref 0.00–0.07)
Basophils Absolute: 0.1 10*3/uL (ref 0.0–0.1)
Basophils Relative: 1 %
Eosinophils Absolute: 0.2 10*3/uL (ref 0.0–0.5)
Eosinophils Relative: 2 %
HCT: 35.4 % — ABNORMAL LOW (ref 39.0–52.0)
Hemoglobin: 11.5 g/dL — ABNORMAL LOW (ref 13.0–17.0)
Immature Granulocytes: 1 %
Lymphocytes Relative: 17 %
Lymphs Abs: 1.9 10*3/uL (ref 0.7–4.0)
MCH: 24.7 pg — ABNORMAL LOW (ref 26.0–34.0)
MCHC: 32.5 g/dL (ref 30.0–36.0)
MCV: 76.1 fL — ABNORMAL LOW (ref 80.0–100.0)
Monocytes Absolute: 0.5 10*3/uL (ref 0.1–1.0)
Monocytes Relative: 5 %
Neutro Abs: 8.2 10*3/uL — ABNORMAL HIGH (ref 1.7–7.7)
Neutrophils Relative %: 74 %
Platelets: 294 10*3/uL (ref 150–400)
RBC: 4.65 MIL/uL (ref 4.22–5.81)
RDW: 22.9 % — ABNORMAL HIGH (ref 11.5–15.5)
WBC: 10.9 10*3/uL — ABNORMAL HIGH (ref 4.0–10.5)
nRBC: 0 % (ref 0.0–0.2)

## 2021-02-27 LAB — MRSA NEXT GEN BY PCR, NASAL: MRSA by PCR Next Gen: NOT DETECTED

## 2021-02-27 LAB — PHOSPHORUS: Phosphorus: 3.4 mg/dL (ref 2.5–4.6)

## 2021-02-27 LAB — SARS CORONAVIRUS 2 (TAT 6-24 HRS): SARS Coronavirus 2: NEGATIVE

## 2021-02-27 LAB — MAGNESIUM: Magnesium: 1.9 mg/dL (ref 1.7–2.4)

## 2021-02-27 MED ORDER — SODIUM CHLORIDE 0.9 % IV SOLN
150.0000 mg | INTRAVENOUS | Status: DC
  Filled 2021-02-27: qty 7.5

## 2021-02-27 MED ORDER — HYDRALAZINE HCL 20 MG/ML IJ SOLN
10.0000 mg | Freq: Four times a day (QID) | INTRAMUSCULAR | Status: AC | PRN
  Administered 2021-02-27 – 2021-02-28 (×2): 10 mg via INTRAVENOUS
  Filled 2021-02-27 (×2): qty 1

## 2021-02-27 MED ORDER — ACETAMINOPHEN 650 MG RE SUPP: 650.0000 mg | Freq: Four times a day (QID) | RECTAL | Status: DC | PRN

## 2021-02-27 MED ORDER — LIDOCAINE VISCOUS HCL 2 % MT SOLN
15.0000 mL | Freq: Three times a day (TID) | OROMUCOSAL | Status: DC
  Administered 2021-02-27 – 2021-03-02 (×6): 15 mL via OROMUCOSAL
  Filled 2021-02-27 (×13): qty 15

## 2021-02-27 MED ORDER — HEPARIN SODIUM (PORCINE) 5000 UNIT/ML IJ SOLN
5000.0000 [IU] | Freq: Three times a day (TID) | INTRAMUSCULAR | Status: DC
  Administered 2021-02-27 – 2021-03-03 (×14): 5000 [IU] via SUBCUTANEOUS
  Filled 2021-02-27 (×14): qty 1

## 2021-02-27 MED ORDER — TRAZODONE HCL 50 MG PO TABS
50.0000 mg | ORAL_TABLET | Freq: Every evening | ORAL | Status: DC | PRN
  Administered 2021-02-27 – 2021-03-01 (×3): 50 mg via ORAL
  Filled 2021-02-27 (×3): qty 1

## 2021-02-27 MED ORDER — ENSURE ENLIVE PO LIQD
237.0000 mL | Freq: Two times a day (BID) | ORAL | Status: DC
  Administered 2021-02-27 – 2021-03-03 (×9): 237 mL via ORAL

## 2021-02-27 MED ORDER — PANTOPRAZOLE SODIUM 40 MG PO TBEC
40.0000 mg | DELAYED_RELEASE_TABLET | Freq: Two times a day (BID) | ORAL | Status: DC
  Administered 2021-02-27 – 2021-03-03 (×9): 40 mg via ORAL
  Filled 2021-02-27 (×9): qty 1

## 2021-02-27 MED ORDER — ACETAMINOPHEN 325 MG PO TABS: 650.0000 mg | ORAL_TABLET | Freq: Four times a day (QID) | ORAL | Status: DC | PRN

## 2021-02-27 MED ORDER — ONDANSETRON HCL 4 MG/2ML IJ SOLN: 4.0000 mg | Freq: Four times a day (QID) | INTRAMUSCULAR | Status: DC | PRN

## 2021-02-27 MED ORDER — AMLODIPINE BESYLATE 5 MG PO TABS
5.0000 mg | ORAL_TABLET | Freq: Every day | ORAL | Status: DC
  Administered 2021-02-27: 5 mg via ORAL
  Filled 2021-02-27: qty 1

## 2021-02-27 MED ORDER — CHLORHEXIDINE GLUCONATE CLOTH 2 % EX PADS
6.0000 | MEDICATED_PAD | Freq: Every day | CUTANEOUS | Status: DC
  Administered 2021-02-27 – 2021-03-02 (×4): 6 via TOPICAL

## 2021-02-27 MED ORDER — SODIUM CHLORIDE 0.9 % IV SOLN
150.0000 mg | Freq: Once | INTRAVENOUS | Status: AC
  Administered 2021-02-27: 150 mg via INTRAVENOUS
  Filled 2021-02-27: qty 7.5

## 2021-02-27 MED ORDER — MORPHINE SULFATE (PF) 2 MG/ML IV SOLN
2.0000 mg | INTRAVENOUS | Status: DC | PRN
Start: 2021-02-27 — End: 2021-03-03

## 2021-02-27 MED ORDER — ATORVASTATIN CALCIUM 20 MG PO TABS
20.0000 mg | ORAL_TABLET | Freq: Every day | ORAL | Status: DC
  Administered 2021-02-27 – 2021-03-03 (×5): 20 mg via ORAL
  Filled 2021-02-27 (×2): qty 2
  Filled 2021-02-27 (×3): qty 1
  Filled 2021-02-27: qty 2

## 2021-02-27 MED ORDER — POTASSIUM CHLORIDE 20 MEQ PO PACK
40.0000 meq | PACK | Freq: Once | ORAL | Status: AC
  Administered 2021-02-27: 40 meq via ORAL
  Filled 2021-02-27: qty 2

## 2021-02-27 MED ORDER — POLYETHYLENE GLYCOL 3350 17 G PO PACK
17.0000 g | PACK | Freq: Every day | ORAL | Status: DC
  Administered 2021-02-27 – 2021-03-01 (×3): 17 g via ORAL
  Filled 2021-02-27 (×3): qty 1

## 2021-02-27 MED ORDER — DEXTROSE 5 % IV SOLN
300.0000 mg | INTRAVENOUS | Status: DC
  Administered 2021-02-27 – 2021-02-28 (×2): 300 mg via INTRAVENOUS
  Filled 2021-02-27 (×3): qty 6

## 2021-02-27 MED ORDER — ORAL CARE MOUTH RINSE
15.0000 mL | Freq: Two times a day (BID) | OROMUCOSAL | Status: DC
  Administered 2021-02-27 – 2021-03-03 (×7): 15 mL via OROMUCOSAL

## 2021-02-27 MED ORDER — OXYCODONE HCL 5 MG PO TABS: 5.0000 mg | ORAL_TABLET | ORAL | Status: DC | PRN

## 2021-02-27 MED ORDER — LACTATED RINGERS IV SOLN: INTRAVENOUS | Status: DC

## 2021-02-27 MED ORDER — ADULT MULTIVITAMIN W/MINERALS CH
1.0000 | ORAL_TABLET | Freq: Every day | ORAL | Status: DC
  Administered 2021-02-27 – 2021-03-03 (×5): 1 via ORAL
  Filled 2021-02-27 (×5): qty 1

## 2021-02-27 MED ORDER — CARVEDILOL 6.25 MG PO TABS
6.2500 mg | ORAL_TABLET | Freq: Two times a day (BID) | ORAL | Status: DC
  Administered 2021-02-27 – 2021-03-01 (×7): 6.25 mg via ORAL
  Filled 2021-02-27 (×7): qty 1

## 2021-02-27 MED ORDER — ACYCLOVIR 200 MG/5ML PO SUSP
200.0000 mg | Freq: Three times a day (TID) | ORAL | Status: DC
  Filled 2021-02-27: qty 10

## 2021-02-27 MED ORDER — ONDANSETRON HCL 4 MG PO TABS: 4.0000 mg | ORAL_TABLET | Freq: Four times a day (QID) | ORAL | Status: DC | PRN

## 2021-02-27 MED ORDER — METOCLOPRAMIDE HCL 5 MG PO TABS
5.0000 mg | ORAL_TABLET | Freq: Four times a day (QID) | ORAL | Status: DC
  Administered 2021-02-27 – 2021-03-03 (×17): 5 mg via ORAL
  Filled 2021-02-27 (×17): qty 1

## 2021-02-27 NOTE — Progress Notes (Signed)
Update -  interpreter pad was located, but the patient would not communicate with staff or with the interpreter.

## 2021-02-27 NOTE — Evaluation (Signed)
Physical Therapy Evaluation Patient Details Name: Logan [name] MRN: [id number] DOB: 1935/04/20 Today's Date: 02/27/2021   History of Present Illness  86 year old man PMH gastric ulcer, GI bleed, chronic iron deficiency anemia , CVA,presenting with epigastric abdominal pain, nausea, vomiting, FTT, on 8/16. Admitted for CHF, acute on Chronic Kidney injury. EGD revealed  reflux esophagitis with no bleeding  Clinical Impression  [name] is an 86 year old man who presents today with decreased ROM, strength and coordination of right UE and LE extremity, generalized weakness, decreased activity tolerance and impaired balance. Patient's family report patient having more difficulty with right leg and arm compared to normal. Patient mod assist for bed mobility and min assist for ambulation with RW.  Patient exhibits some cognitive impairment - perseverating on getting food, difficulty maintaining attention and though alert to self, place and year wasn't able to answer questions with detail or comprehend that therapist could not get him [place] food. Use of interpreter - but still limited. Unsure of patient's true abilities as he is not a reliable historian and will need to follow up with family in regards to [place].  Patient will benefit from skilled PT services while in hospital to improve deficits and learn compensatory strategies as needed in order to return to PLOF.  Expect patient could still return home with 24/7 assistance and home health.     Follow Up Recommendations Home health PT;Supervision/Assistance - 24 hour    Equipment Recommendations  None recommended by PT    Recommendations for Other Services       Precautions / Restrictions Precautions Precautions: Fall Precaution Comments: use interpreter-Gujarati Restrictions Weight Bearing Restrictions: No      Mobility  Bed Mobility Overal bed mobility: Needs Assistance Bed Mobility: Supine to Sit;Sit to Supine   Sidelying to  sit: Mod assist Supine to sit: Min assist;Mod assist Sit to supine: Mod assist   General bed mobility comments: assist trunk to sit upright. Mod assist for LEs to transfer back in to bed.    Transfers Overall transfer level: Needs assistance Equipment used: Rolling walker (2 wheeled) Transfers: Sit to/from Stand Sit to Stand: Min assist Stand pivot transfers: Min assist       General transfer comment: Multimodal cues with assist to bring wt up and fwd and to balance in standing with RW  Ambulation/Gait Ambulation/Gait assistance: Min assist Gait Distance (Feet): 26 Feet Assistive device: Rolling walker (2 wheeled) Gait Pattern/deviations: Step-to pattern;Step-through pattern;Decreased step length - right;Decreased stance time - left;Shuffle;Trunk flexed Gait velocity: decr   General Gait Details: Cues for posture, position from RW.  Pt with noted shuffling R step and largely not bringing R foot even with L but intermittently taking full step-through with R.  Distance limited by pt fatigue  Stairs            Wheelchair Mobility    Modified Rankin (Stroke Patients Only)       Balance Overall balance assessment: Needs assistance Sitting-balance support: No upper extremity supported Sitting balance-Leahy Scale: Good     Standing balance support: During functional activity Standing balance-Leahy Scale: Poor                               Pertinent Vitals/Pain Pain Assessment: No/denies pain Faces Pain Scale: No hurt    Home Living Family/patient expects to be discharged to:: Private residence Living Arrangements: Children;Spouse/significant other Available Help at Discharge: Family;Available 24 hours/day Type of  Home: House Home Access: Stairs to enter   [employer] of Steps: unsure Home Layout: One level Home Equipment: Walker - 2 wheels Additional Comments: Use of intepreter but unsure of reliability of answers.    Prior Function  Level of Independence: Needs assistance   Gait / Transfers Assistance Needed: use of walker. reports walking is very limited to approx 10 feet in home.  ADL's / Homemaking Assistance Needed: has assistance with ADLs. Reports he can walk to the bathroom.  Comments: patient unable to provide details. Expect patient had assistance with ADLs and IADLs.     Hand Dominance   Dominant Hand: Right    Extremity/Trunk Assessment   Upper Extremity Assessment Upper Extremity Assessment: Defer to OT evaluation RUE Deficits / Details: WFL ROM, grossly functional strength. Hard to assess due to unable to follow MMT commands. RUE Sensation: WNL RUE Coordination: WNL LUE Deficits / Details: grossly functional ROM but decreased coordination and strength. LUE Coordination: decreased fine motor;decreased gross motor    Lower Extremity Assessment Lower Extremity Assessment: RLE deficits/detail;LLE deficits/detail RLE Deficits / Details: Difficult to assess 2* language barrier but pt able to lift Bil LEs independently and bring over EOB.  Pt tends to drag R LE with ambulation but intermittently will take full step through.    Cervical / Trunk Assessment Cervical / Trunk Assessment: Normal  Communication   Communication: Prefers language other than English  Cognition Arousal/Alertness: Awake/alert Behavior During Therapy: WFL for tasks assessed/performed Overall Cognitive Status: Difficult to assess Area of Impairment: Safety/judgement                         Safety/Judgement: Decreased awareness of deficits     General Comments: Perseverates on food and wanting to eat. Keeps asking for [place] food and is unable to comprehend that he can't get that in hospital. Knows he is in the hospital and the year.      General Comments      Exercises     Assessment/Plan    PT Assessment Patient needs continued PT services  PT Problem List Decreased strength;Decreased balance;Decreased  cognition;Decreased knowledge of precautions;Decreased mobility;Decreased knowledge of use of DME;Decreased activity tolerance;Pain       PT Treatment Interventions DME instruction;Therapeutic activities;Cognitive remediation;Gait training;Therapeutic exercise;Patient/family education;Functional mobility training    PT Goals (Current goals can be found in the Care Plan section)  Acute Rehab PT Goals Patient Stated Goal: indicates to get back into bed PT Goal Formulation: Patient unable to participate in goal setting Time For Goal Achievement: 02/26/21 Potential to Achieve Goals: Good    Frequency Min 3X/week   Barriers to discharge        Co-evaluation PT/OT/SLP Co-Evaluation/Treatment: Yes Reason for Co-Treatment: For patient/therapist safety PT goals addressed during session: Mobility/safety with mobility OT goals addressed during session: ADL's and self-care       AM-PAC PT "6 Clicks" Mobility  Outcome Measure Help needed turning from your back to your side while in a flat bed without using bedrails?: A Little Help needed moving from lying on your back to sitting on the side of a flat bed without using bedrails?: A Lot Help needed moving to and from a bed to a chair (including a wheelchair)?: A Lot Help needed standing up from a chair using your arms (e.g., wheelchair or bedside chair)?: A Little Help needed to walk in hospital room?: A Little Help needed climbing 3-5 steps with a railing? : A Lot 6  Click Score: 15    End of Session Equipment Utilized During Treatment: Gait belt Activity Tolerance: Patient tolerated treatment well Patient left: in bed;with call bell/phone within reach;with bed alarm set Nurse Communication: Mobility status PT Visit Diagnosis: Unsteadiness on feet (R26.81);Muscle weakness (generalized) (M62.81);Difficulty in walking, not elsewhere classified (R26.2)    Time: 1212-1238 PT Time Calculation (min) (ACUTE ONLY): 26 min   Charges:   PT  Evaluation $PT Eval Low Complexity: 1 Low          Debe Coder PT Acute Rehabilitation Services Pager [phone] Office [phone]   Seaira Byus 02/27/2021, 2:17 PM

## 2021-02-27 NOTE — Progress Notes (Addendum)
Initial Nutrition Assessment  DOCUMENTATION CODES:   Non-severe (moderate) malnutrition in context of chronic illness  INTERVENTION:  - continue Ensure Plus BID, each supplement provides 350 kcal and 13 grams of protein. - will order 1 tablet multivitamin with minerals/day.   NUTRITION DIAGNOSIS:   Moderate Malnutrition related to chronic illness as evidenced by mild fat depletion, mild muscle depletion.  GOAL:   Patient will meet greater than or equal to 90% of their needs  MONITOR:   PO intake, Labs, Weight trends, I & O's  REASON FOR ASSESSMENT:   Malnutrition Screening Tool  ASSESSMENT:   86 year old male with medical history of chronic iron deficiency anemia, hx of esophageal ulcer, hiatal hernia, GERD, HLD, and previous GIB. He presented to the ED from home d/t FTT, not eating well, not walking well, and constipation x10 days.  No intakes documented since admission. Ensure Plus is ordered BID and he accepted the bottle offered to him earlier today.   Patient was seen by SLP shortly before RD visit and recommendation made for Regular, thin. Patient is currently ordered Soft, thin which is appropriate.  Patient laying in bed with wife and granddaughter at bedside. Granddaughter is able to provide information and to translate for patient and his wife and for RD.   Patient has had a decreased appetite since admission ~2 weeks ago. He enjoys [place], [place] food, and pizza. He has been requesting Janine Limbo today. Encouraged family to bring in any foods patient enjoys.   Patient is able to feed himself, has no chewing or swallowing difficulties at baseline, and was able to ambulate PTA although he was falling 3-4 times/week. He has had a stroke in the past and had some additional deficits around that time (wife reports this was several years ago).  Weight today is 131 lb which is consistent with weight recordings in the fall of 2018. Weight on 02/16/21 was 148 lb, and  prior to that the most recently documented weight was on 04/30/20 when he weighed 145 lb.   Noted mild/non-pitting edema to all extremities. Granddaughter is unsure if edema was worse PTA and patient and wife are unable to provide detail on this.   Labs reviewed; Cl: 112 mmol/l, creatinine: 2.19 mg/dl, Ca: 7.7 mg/dl, GFR: 29 ml/min. Medications reviewed; 5 mg oral reglan QID, 40 mg oral protonix BID, 17 g miralax/day, 40 mEq Klor-Con x1 dose 9/2. IVF; LR @ 125 ml/hr.     NUTRITION - FOCUSED PHYSICAL EXAM:  Flowsheet Row Most Recent Value  Orbital Region Mild depletion  Upper Arm Region Mild depletion  Thoracic and Lumbar Region Unable to assess  Buccal Region Mild depletion  Temple Region Moderate depletion  Clavicle Bone Region Mild depletion  Clavicle and Acromion Bone Region Mild depletion  Scapular Bone Region Unable to assess  Dorsal Hand No depletion  Patellar Region No depletion  Anterior Thigh Region Unable to assess  Posterior Calf Region No depletion  Edema (RD Assessment) Mild  [all extremities]  Hair Reviewed  Eyes Reviewed  Mouth Reviewed  Skin Reviewed  Nails Reviewed       Diet Order:   Diet Order             DIET SOFT Room service appropriate? Yes; Fluid consistency: Thin  Diet effective now                   EDUCATION NEEDS:   Education needs have been addressed  Skin:  Skin Assessment: Reviewed RN Assessment  Last BM:  PTA/unknown  Height:   Ht Readings from Last 1 Encounters:  02/26/21 '5\' 9"'$  (1.753 m)    Weight:   Wt Readings from Last 1 Encounters:  02/27/21 59.4 kg     Estimated Nutritional Needs:  Kcal:  1600-1800 kcal Protein:  80-95 grams Fluid:  >/= 1.5 L/day     Jarome Matin, MS, RD, LDN, CNSC Inpatient Clinical Dietitian RD pager # available in [facility]  After hours/weekend pager # available in [facility]

## 2021-02-27 NOTE — Progress Notes (Signed)
PROGRESS NOTE    Logan [name]  [id number] DOB: 1935-02-07 DOA: 02/26/2021 PCP: Jilda Panda, MD    Brief Narrative:  86 year old gentleman with history of chronic anemia of iron deficiency, history of esophageal ulcer hiatal hernia, GERD, hyperlipidemia and previous GI bleeding brought from home with failure to thrive, not eating well and not walking well as well constipation for last 10 days.  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  Unable to take much of the medication by mouth.  Home health nurse reported weight loss so sent to the hospital.   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 Started on IV acyclovir along with lidocaine viscous.  Continue Protonix. Encourage oral intake, can eat food from home.  Stay on soft diet with aspiration precautions. Calorie count.  Hypertensive urgency: Presented with blood pressure of 240s/100.  Only on carvedilol at home.  Overnight on Cardene infusion.  Will start patient on amlodipine 5 mg, carvedilol resumed.  Will taper off Cardene.  Iron deficiency anemia: Recently diagnosed.  Hemoglobin is 11 and adequate.  Will give 1 dose of IV iron when he is in the hospital.  Electrolytes: Potassium was low, replaced.  Magnesium and phosphorus is adequate.   DVT prophylaxis: heparin injection 5,000 Units Start: 02/27/21 0600 SCDs Start: 02/27/21 0103   Code Status: Full code Family Communication: Patient's son, daughter-in-law on the phone Disposition Plan: Status is: Inpatient  Remains inpatient appropriate because:IV treatments appropriate due to intensity of illness or inability to take PO and Inpatient level of care appropriate due to severity of illness  Dispo: The patient is  from: Home              Anticipated d/c is to: Home              Patient currently is not medically stable to d/c.   Difficult to place patient No         Consultants:  Gastroenterology  Procedures:  None  Antimicrobials:  Acyclovir 9/2---   Subjective: Patient seen and examined.  He has some trouble with speaking, however cannot communicate well when spoken in his Hindi language.  At my interview, he denied any complaints at rest.  He just feels weak and overall tired.  Denies any nausea or vomiting.  He tells me that he has coughing episodes after eating and feels like food sticking in his chest. Also constipated, no bowel movement for last 4 days.  Objective: Vitals:   02/27/21 1100 02/27/21 1130 02/27/21 1200 02/27/21 1230  BP: (!) 121/101 (!) 152/98 (!) 158/89 (!) 172/85  Pulse: 87 85 78 87  Resp: '11 16 16 '$ (!) 22  Temp:   97.7 F (36.5 C)   TempSrc:   Oral   SpO2: 98% 98% 99% 100%  Weight:      Height:        Intake/Output Summary (Last 24 hours) at 02/27/2021 1312 Last data filed at 02/27/2021 0500 Gross per 24 hour  Intake 231.9 ml  Output 400 ml  Net -168.1 ml   Filed Weights   02/26/21 1445 02/27/21 0100  Weight: 58.1 kg 59.4 kg    Examination:  General exam: Looks comfortable on room air.  Frail and debilitated but not in any distress. Respiratory system: No added sounds.  Cardiovascular system: S1 & S2 heard, RRR. No JVD, murmurs, rubs, gallops or clicks. No pedal edema. Gastrointestinal system: Abdomen is nondistended, soft and nontender. No organomegaly or masses felt. Normal bowel sounds heard. Central nervous system: Alert and oriented. No focal neurological deficits. Extremities: Symmetric 5 x 5 power. Skin: No rashes, lesions or ulcers Psychiatry: Judgement and insight appear normal. Mood & affect appropriate.     Data Reviewed: I have personally reviewed following labs and imaging studies  CBC: Recent Labs  Lab 02/26/21 1541  02/27/21 0332  WBC 8.6 10.9*  NEUTROABS 5.7 8.2*  HGB 10.8* 11.5*  HCT 35.2* 35.4*  MCV 78.4* 76.1*  PLT 308 [id number]   Basic Metabolic Panel: Recent Labs  Lab 02/26/21 1541 02/27/21 0332  NA 147* 142  K 3.0* 3.7  CL 113* 112*  CO2 28 24  GLUCOSE 105* 124*  BUN 16 16  CREATININE 2.37* 2.19*  CALCIUM 8.0* 7.7*  MG  --  1.9  PHOS  --  3.4   GFR: Estimated Creatinine Clearance: 21.5 mL/min (A) (by C-G formula based on SCr of 2.19 mg/dL (H)). Liver Function Tests: Recent Labs  Lab 02/26/21 1541 02/27/21 0332  AST 24 23  ALT 16 16  ALKPHOS 64 61  BILITOT 0.5 0.7  PROT 5.0* 4.9*  ALBUMIN 2.3* 2.3*   Recent Labs  Lab 02/26/21 1820  LIPASE 23   No results for input(s): AMMONIA in the last 168 hours. Coagulation Profile: Recent Labs  Lab 02/26/21 1541  INR 1.0   Cardiac Enzymes: No results for input(s): CKTOTAL, CKMB, CKMBINDEX, TROPONINI in the last 168 hours. BNP (last 3 results) No results for input(s): PROBNP in the last 8760 hours. HbA1C: No results for input(s): HGBA1C in the last 72 hours. CBG: No results for input(s): GLUCAP in the last 168 hours. Lipid Profile: No results for input(s): CHOL, HDL, LDLCALC, TRIG, CHOLHDL, LDLDIRECT in the last 72 hours. Thyroid Function Tests: Recent Labs    02/27/21 0332  TSH 6.135*   Anemia Panel: No results for input(s): VITAMINB12, FOLATE, FERRITIN, TIBC, IRON, RETICCTPCT in the last 72 hours. Sepsis Labs: No results for input(s): PROCALCITON, LATICACIDVEN in the last 168 hours.  Recent Results (from the past 240 hour(s))  SARS CORONAVIRUS 2 (TAT 6-24 HRS) Nasopharyngeal Nasopharyngeal Swab     Status: None   Collection Time: 02/27/21 12:31 AM   Specimen: Nasopharyngeal Swab  Result Value Ref Range Status   SARS Coronavirus 2 NEGATIVE NEGATIVE Final    Comment: (NOTE) SARS-CoV-2 target nucleic acids are NOT DETECTED.  The SARS-CoV-2 RNA is generally detectable in upper and lower respiratory specimens  during the acute phase of infection. Negative results do not preclude SARS-CoV-2 infection, do not rule out co-infections with other pathogens, and should not be used as the sole basis for treatment or other patient management decisions. Negative results must be combined with clinical observations, patient history, and epidemiological information. The expected result is Negative.  Fact Sheet for Patients: SugarRoll.be  Fact Sheet for Healthcare Providers: https://www.woods-mathews.com/  This test is not yet approved or cleared by the [place] FDA and  has been authorized for detection and/or diagnosis of SARS-CoV-2 by FDA under an Emergency Use Authorization (EUA). This EUA will remain  in effect (meaning this test can be used) for the duration of the COVID-19 declaration under Se ction 564(b)(1) of the Act, 21 U.S.C. section 360bbb-3(b)(1), unless the authorization is terminated or revoked sooner.  Performed at [facility], [address] [place], [place] [address]   MRSA Next Gen by PCR, Nasal     Status: None   Collection Time: 02/27/21  1:03 AM   Specimen: Nasal Mucosa; Nasal Swab  Result Value Ref Range Status   MRSA by PCR Next Gen NOT DETECTED NOT DETECTED Final    Comment: (NOTE) The GeneXpert MRSA Assay (FDA approved for NASAL specimens only), is one component of a comprehensive MRSA colonization surveillance program. It is not intended to diagnose MRSA infection nor to guide or monitor treatment for MRSA infections. Test performance is not FDA approved in patients less than 79 years old. Performed at [facility], [place] [address] [place], [place] [address]          Radiology Studies: CT ABDOMEN PELVIS WO CONTRAST  Result Date: 02/26/2021 CLINICAL DATA:  Weakness, weight loss and poor oral intake. EXAM: CT ABDOMEN AND PELVIS WITHOUT CONTRAST TECHNIQUE: Multidetector CT imaging of the  abdomen and pelvis was performed following the standard protocol without IV contrast. COMPARISON:  February 10, 2021 FINDINGS: Lower chest: Mild to moderate severity areas of atelectasis and/or infiltrate are seen within the bilateral lung bases. Small bilateral pleural effusions are seen. A small pericardial effusion is present. Hepatobiliary: No focal liver abnormality is seen. No gallstones, gallbladder wall thickening, or biliary dilatation. Pancreas: Unremarkable. No pancreatic ductal dilatation or surrounding inflammatory changes. Spleen: Normal in size without focal abnormality. Adrenals/Urinary Tract: Adrenal glands are unremarkable. Kidneys are normal, without renal calculi, focal lesion, or hydronephrosis. Bladder is unremarkable. Stomach/Bowel: Stomach is within normal limits. Appendix appears normal. No evidence of bowel wall thickening, distention, or inflammatory changes. Vascular/Lymphatic: Aortic atherosclerosis. No enlarged abdominal or pelvic lymph nodes. Reproductive: The prostate gland is mildly enlarged. Other: No abdominal wall hernia or abnormality. No abdominopelvic ascites. Musculoskeletal: A total right hip replacement is seen with associated streak artifact and subsequently limited evaluation of the adjacent osseous and soft tissue structures. Degenerative changes seen throughout the lumbar spine. IMPRESSION: 1. Mild to moderate severity bibasilar atelectasis and/or infiltrate with small bilateral pleural effusions. 2. Small pericardial effusion. 3. Mildly enlarged prostate gland. 4. Total right hip replacement. 5. Aortic atherosclerosis. Aortic Atherosclerosis (ICD10-I70.0). Electronically Signed   By: Virgina Norfolk M.D.   On: 02/26/2021 18:35   CT HEAD WO CONTRAST (5MM)  Result Date: 02/26/2021 CLINICAL DATA:  Weakness, poor oral intake and 10 pound weight loss in 2 days. EXAM: CT HEAD WITHOUT CONTRAST TECHNIQUE: Contiguous axial images were obtained from the base of the skull  through the vertex without intravenous contrast. COMPARISON:  None. FINDINGS: Brain: There is mild to moderate severity cerebral atrophy with widening of the extra-axial spaces and ventricular dilatation. There are areas of decreased attenuation within the white matter tracts of the supratentorial brain, consistent with microvascular disease changes. Chronic bilateral basal ganglia lacunar infarcts are noted. Vascular: No hyperdense vessel or unexpected calcification. Skull: Normal. Negative for fracture or focal lesion. Sinuses/Orbits: No acute finding. 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 IMPRESSION: 1. Generalized cerebral atrophy. 2. Chronic bilateral basal ganglia lacunar infarcts. 3. No acute intracranial abnormality. Electronically Signed   By: Virgina Norfolk M.D.   On: 02/26/2021 18:37   VAS [place] LOWER EXTREMITY VENOUS (DVT) (ONLY [facility] & [facility])  Result Date: 02/27/2021  Lower Venous DVT Study Patient Name:  [facility]  Date of Exam:   02/26/2021 Medical Rec #: [id number]      Accession #:  [id number] Date of Birth: 13-Sep-1934     Patient Gender: M Patient Age:   49 years Exam Location:  [facility] Procedure:      VAS [place] LOWER EXTREMITY VENOUS (DVT) Referring Phys: Gareth Morgan --------------------------------------------------------------------------------  Indications: Swelling.  Risk Factors: Cancer. Limitations: Poor ultrasound/tissue interface. Comparison Study: No prior studies. Performing Technologist: Oliver Hum RVT  Examination Guidelines: A complete evaluation includes B-mode imaging, spectral Doppler, color Doppler, and power Doppler as needed of all accessible portions of each vessel. Bilateral testing is considered an integral part of a complete examination. Limited examinations for reoccurring indications may be performed as noted. The reflux portion of the exam is performed with the patient in reverse Trendelenburg.   +---------+---------------+---------+-----------+----------+--------------+ RIGHT    CompressibilityPhasicitySpontaneityPropertiesThrombus Aging +---------+---------------+---------+-----------+----------+--------------+ CFV      Full           Yes      Yes                                 +---------+---------------+---------+-----------+----------+--------------+ SFJ      Full                                                        +---------+---------------+---------+-----------+----------+--------------+ FV Prox  Full                                                        +---------+---------------+---------+-----------+----------+--------------+ FV Mid   Full                                                        +---------+---------------+---------+-----------+----------+--------------+ FV DistalFull                                                        +---------+---------------+---------+-----------+----------+--------------+ PFV      Full                                                        +---------+---------------+---------+-----------+----------+--------------+ POP      Full           Yes      Yes                                 +---------+---------------+---------+-----------+----------+--------------+ PTV      Full                                                        +---------+---------------+---------+-----------+----------+--------------+  PERO     Full                                                        +---------+---------------+---------+-----------+----------+--------------+   +----+---------------+---------+-----------+----------+--------------+ LEFTCompressibilityPhasicitySpontaneityPropertiesThrombus Aging +----+---------------+---------+-----------+----------+--------------+ CFV Full           Yes      Yes                                  +----+---------------+---------+-----------+----------+--------------+     Summary: RIGHT: - There is no evidence of deep vein thrombosis in the lower extremity.  - No cystic structure found in the popliteal fossa.  LEFT: - No evidence of common femoral vein obstruction.  *See table(s) above for measurements and observations. Electronically signed by Jamelle Haring on 02/27/2021 at 7:46:50 AM.    Final    UE Venous Duplex ([facility] and [facility] ONLY)  Result Date: 02/27/2021 UPPER VENOUS STUDY  Patient Name:  [facility]  Date of Exam:   02/26/2021 Medical Rec #: [id number]      Accession #:    [id number] Date of Birth: [birth year]     Patient Gender: M Patient Age:   63 years Exam Location:  [facility] Procedure:      VAS [place] UPPER EXTREMITY VENOUS DUPLEX Referring Phys: Gareth Morgan --------------------------------------------------------------------------------  Indications: Swelling Risk Factors: Cancer. Comparison Study: No prior studies. Performing Technologist: Oliver Hum RVT  Examination Guidelines: A complete evaluation includes B-mode imaging, spectral Doppler, color Doppler, and power Doppler as needed of all accessible portions of each vessel. Bilateral testing is considered an integral part of a complete examination. Limited examinations for reoccurring indications may be performed as noted.  Right Findings: +----------+------------+---------+-----------+----------+-------+ RIGHT     CompressiblePhasicitySpontaneousPropertiesSummary +----------+------------+---------+-----------+----------+-------+ IJV           Full       Yes       Yes                      +----------+------------+---------+-----------+----------+-------+ Subclavian    Full       Yes       Yes                      +----------+------------+---------+-----------+----------+-------+ Axillary      Full       Yes       Yes                      +----------+------------+---------+-----------+----------+-------+  Brachial      Full       Yes       Yes                      +----------+------------+---------+-----------+----------+-------+ Radial        Full                                          +----------+------------+---------+-----------+----------+-------+ Ulnar         Full                                          +----------+------------+---------+-----------+----------+-------+  Cephalic      Full                                          +----------+------------+---------+-----------+----------+-------+ Basilic       Full                                          +----------+------------+---------+-----------+----------+-------+  Left Findings: +----------+------------+---------+-----------+----------+-------+ LEFT      CompressiblePhasicitySpontaneousPropertiesSummary +----------+------------+---------+-----------+----------+-------+ Subclavian    Full       Yes       Yes                      +----------+------------+---------+-----------+----------+-------+  Summary:  Right: No evidence of deep vein thrombosis in the upper extremity. No evidence of superficial vein thrombosis in the upper extremity.  Left: No evidence of thrombosis in the subclavian.  *See table(s) above for measurements and observations.  Diagnosing physician: Jamelle Haring Electronically signed by Jamelle Haring on 02/27/2021 at 7:46:37 AM.    Final         Scheduled Meds:  amLODipine  5 mg Oral Daily   atorvastatin  20 mg Oral Daily   carvedilol  6.25 mg Oral BID   Chlorhexidine Gluconate Cloth  6 each Topical Daily   feeding supplement  237 mL Oral BID BM   heparin  5,000 Units Subcutaneous Q8H   lidocaine  15 mL Mouth/Throat TID AC   mouth rinse  15 mL Mouth Rinse BID   metoCLOPramide  5 mg Oral QID   pantoprazole  40 mg Oral BID   polyethylene glycol  17 g Oral Daily   Continuous Infusions:  acyclovir     lactated ringers       LOS: 1 day    Time spent: 35 minutes      Barb Merino, MD [facility] Pager [phone]

## 2021-02-27 NOTE — ED Notes (Signed)
Attempted to call pt daughter X2 to give update, was not able to reach her.

## 2021-02-27 NOTE — Progress Notes (Signed)
[facility] Gastroenterology Progress Note  Logan [name] 86 y.o. [birth year]  CC:   Poor oral intake   Subjective: Patient is a 86 year old patient admitted to the hospital with poor oral intake and weight loss.  He was recently admitted to the hospital and was seen by GI for abdominal pain.  Underwent EGD which showed esophageal ulcers.  Biopsies came back positive for herpes esophagitis.  He was started on acyclovir 200 mg 3 times daily as an outpatient but according to patient's family he only took 3 pills of acyclovir in last few days  He was seen by hematology yesterday for iron deficiency anemia and he was advised to come to the hospital because of poor oral intake.    Objective: Vital signs in last 24 hours: Vitals:   02/27/21 0600 02/27/21 0800  BP: (!) 150/78   Pulse: 77   Resp: 11   Temp:  97.7 F (36.5 C)  SpO2: 100%     Physical Exam:  Elderly patient.  Not in acute distress Abdomen : Soft, nontender, nondistended, bowel sounds present.  No peritoneal signs  Lab Results: Recent Labs    02/26/21 1541 02/27/21 0332  NA 147* 142  K 3.0* 3.7  CL 113* 112*  CO2 28 24  GLUCOSE 105* 124*  BUN 16 16  CREATININE 2.37* 2.19*  CALCIUM 8.0* 7.7*  MG  --  1.9  PHOS  --  3.4   Recent Labs    02/26/21 1541 02/27/21 0332  AST 24 23  ALT 16 16  ALKPHOS 64 61  BILITOT 0.5 0.7  PROT 5.0* 4.9*  ALBUMIN 2.3* 2.3*   Recent Labs    02/26/21 1541 02/27/21 0332  WBC 8.6 10.9*  NEUTROABS 5.7 8.2*  HGB 10.8* 11.5*  HCT 35.2* 35.4*  MCV 78.4* 76.1*  PLT 308 294   Recent Labs    02/26/21 1541  LABPROT 12.6  INR 1.0      Assessment/Plan: -Poor oral intake.  EGD on February 11, 2021 showed distal esophagitis.  Biopsies came back positive for herpes esophagitis with ulceration.  He was started on acyclovir 200 mg 3 times daily as an outpatient but patient only took 3 pills according to patient's family. -Acute on chronic anemia -Chronic kidney  injury  Recommendations ------------------------ -Discussed with pharmacist.  Also discussed with hospitalist. -Recommend IV acyclovir for few days before considering PEG tube placement -Also recommend viscous lidocaine for esophagitis -Okay to have diet as tolerated from GI standpoint.  Patient wants to eat home-cooked food.  Okay to have it from GI standpoint -GI will follow  Otis Brace MD, [place] 02/27/2021, 12:08 PM  Contact #  [phone]

## 2021-02-27 NOTE — Evaluation (Signed)
Clinical/Bedside Swallow Evaluation Patient Details  Name: Logan [name] MRN: [id number] Date of Birth: 1934-08-19  Today's Date: 02/27/2021 Time: SLP Start Time (ACUTE ONLY): 1143 SLP Stop Time (ACUTE ONLY): 1158 SLP Time Calculation (min) (ACUTE ONLY): 15 min  Past Medical History:  Past Medical History:  Diagnosis Date   Anemia    Cataract    Esophageal ulcer    Gastritis    proximal   Hiatal hernia    Hyperlipidemia    Intractable hiccups    Reflux esophagitis    Stroke ([place])    r side deficits; September 2017   Upper GI bleed    Past Surgical History:  Past Surgical History:  Procedure Laterality Date   BIOPSY  02/11/2021   Procedure: BIOPSY;  Surgeon: Clarene Essex, MD;  Location: [facility] END[facility]COPY;  Service: Endoscopy;;   COLONOSCOPY     ESOPHAGOGASTRODUODENOSCOPY     ESOPHAGOGASTRODUODENOSCOPY N/A 03/04/2017   Procedure: ESOPHAGOGASTRODUODENOSCOPY (EGD);  Surgeon: Mauri Pole, MD;  Location: [facility] ENDOSCOPY;  Service: Endoscopy;  Laterality: N/A;   ESOPHAGOGASTRODUODENOSCOPY (EGD) WITH PROPOFOL N/A 02/11/2021   Procedure: ESOPHAGOGASTRODUODENOSCOPY (EGD) WITH PROPOFOL;  Surgeon: Clarene Essex, MD;  Location: [facility] ENDOSCOPY;  Service: Endoscopy;  Laterality: N/A;   UPPER GASTROINTESTINAL ENDOSCOPY     HPI:  Pt is a 86 y.o. male presenting to ED for failure to thrive. He was recently discharged about 10 days ago.  At his last admission he was complaining of intractable abdominal pain. Oncology clinic notified that pt has had a 9 pound weight loss. Family has also noticed some choking at mealtime. CT abdomen/pelvis (02/26/21) revealed "Mild to moderate severity bibasilar atelectasis and/or infiltrate with small bilateral pleural effusions".  CT head (02/26/21) was without acuite intracranial abnormality. PMH: anemia, esophageal ulcer, hiatal hernia, HLD, GERD, esophagitis, Stroke (2017), and GI bleed.   Assessment / Plan / Recommendation Clinical Impression  Pt seen for bedside  swallow evaluation with Gastroenterologist present and assisting with interpretation. Oral mechanism examination unremarkable, except for some missing upper dentition. Pt's vocal quality also noted be be hoarse, which he states is a recent change over the past few days. Sips of thin liquids via consecutive straw sips were without s/sx of aspiration, however belching occurred after trial and suspect esophageal component given extensive hx. Overt cough x1 noted with single straw sip and this was the only notable s/sx of aspiration throughout evaluation. Bites of puree and regular textured solid were orally cleared, but pt observed to perseverate on munch-like mastication long after bolus clearance. Recommend continuation of current diet and staff to assist pt with small bites/sips at slow rate. SLP to f/u for tolerance and any further recommendations pending progress with treatment of esophageal function.  SLP Visit Diagnosis: Dysphagia, unspecified (R13.10)    Aspiration Risk  Mild aspiration risk    Diet Recommendation Regular (soft);Thin liquid   Liquid Administration via: Straw;Cup Medication Administration: Whole meds with liquid Supervision: Staff to assist with self feeding;Full supervision/cueing for compensatory strategies Compensations: Minimize environmental distractions;Slow rate;Small sips/bites;Follow solids with liquid Postural Changes: Seated upright at 90 degrees;Remain upright for at least 30 minutes after po intake    Other  Recommendations Oral Care Recommendations: Oral care BID;Staff/trained caregiver to provide oral care   Follow up Recommendations  (TBD)      Frequency and Duration min 2x/week  2 weeks       Prognosis Prognosis for Safe Diet Advancement: Good Barriers to Reach Goals: Time post onset      Swallow Study  General Date of Onset: 02/27/21 HPI: Pt is a 86 y.o. male presenting to ED for failure to thrive. He was recently discharged about 10 days ago.   At his last admission he was complaining of intractable abdominal pain. Oncology clinic notified that pt has had a 9 pound weight loss. Family has also noticed some choking at mealtime. CT abdomen/pelvis (02/26/21) revealed "Mild to moderate severity bibasilar atelectasis and/or infiltrate with small bilateral pleural effusions".  CT head (02/26/21) was without acuite intracranial abnormality. PMH: anemia, esophageal ulcer, hiatal hernia, HLD, GERD, esophagitis, Stroke (2017), and GI bleed. Type of Study: Bedside Swallow Evaluation Previous Swallow Assessment: none noted in EMR Diet Prior to this Study: Regular;Thin liquids (soft) Temperature Spikes Noted: No Respiratory Status: Room air History of Recent Intubation: No Behavior/Cognition: Alert;Cooperative;Pleasant mood Oral Cavity Assessment: Within Functional Limits Oral Care Completed by SLP: No Oral Cavity - Dentition: Missing dentition Vision: Functional for self-feeding Self-Feeding Abilities: Able to feed self Patient Positioning: Upright in bed;Postural control adequate for testing Baseline Vocal Quality: Hoarse Volitional Cough: Strong Volitional Swallow: Able to elicit    Oral/Motor/Sensory Function Overall Oral Motor/Sensory Function: Within functional limits   Ice Chips Ice chips: Not tested   Thin Liquid Thin Liquid: Impaired Presentation: Straw;Self Fed Pharyngeal  Phase Impairments: Cough - Immediate    Nectar Thick Nectar Thick Liquid: Not tested   Honey Thick Honey Thick Liquid: Not tested   Puree Puree: Within functional limits Presentation: [place], [place], [place] Office Number: [phone]  Solid: Within functional limits      Acie Fredrickson 02/27/2021,12:33 PM

## 2021-02-27 NOTE — Progress Notes (Signed)
Pharmacy Antibiotic Note  Logan [name] is a 86 y.o. male admitted on 02/26/2021 with FTT, poor PO intake.  Pharmacy has been consulted for Acyclovir dosing for herpes esophagitis. PMH:  chronic diastolic heart failure, CKD III, 1 week history of leg/arm swelling.  Plan: Acyclovir '300mg'$  (5 mg/kg) IV q24h (renal dosing for CrCl < 25 ml/min) LR at 125 ml/hr (OK'd by Dr. Sloan Leiter) Follow up renal function daily Per Dr. Alessandra Bevels, likely change to oral after a few days of IV therapy.   Height: '5\' 9"'$  (175.3 cm) Weight: 59.4 kg (130 lb 15.3 oz) IBW/kg (Calculated) : 70.7  Temp (24hrs), Avg:97.7 F (36.5 C), Min:97.5 F (36.4 C), Max:97.9 F (36.6 C)  Recent Labs  Lab 02/26/21 1541 02/27/21 0332  WBC 8.6 10.9*  CREATININE 2.37* 2.19*    Estimated Creatinine Clearance: 21.5 mL/min (A) (by C-G formula based on SCr of 2.19 mg/dL (H)).    No Known Allergies  Antimicrobials this admission: 9/2 acyclovir >>   Dose adjustments this admission:   Microbiology results: 9/1 UCx: 9/2 MRSA PCR: not detected 9/2 Covid: negative  Thank you for allowing pharmacy to be a part of this patient's care.  Gretta Arab PharmD, BCPS Clinical Pharmacist [facility] main pharmacy [phone] 02/27/2021 8:07 AM

## 2021-02-27 NOTE — Telephone Encounter (Signed)
No 9/1 los

## 2021-02-27 NOTE — Evaluation (Signed)
Occupational Therapy Evaluation Patient Details Name: Logan [name] MRN: [id number] DOB: [birth year] Today's Date: 02/27/2021    History of Present Illness 86 year old man PMH gastric ulcer, GI bleed, chronic iron deficiency anemia , CVA,presenting with epigastric abdominal pain, nausea, vomiting, FTT, on 8/16. Admitted for CHF, acute on Chronic Kidney injury. EGD revealed  reflux esophagitis with no bleeding   Clinical Impression   Mr. Averi Ruffolo is an 86 year old man who presents today with decreased ROM, strength and coordination of right upper extremity, generalized weakness, decreased activity tolerance and impaired balance. Patient's family report patient having more difficulty with right leg and arm compared to normal. Patient mod assist for bed mobility, min assist for ambulation with RW and more assistance for ADLs. Patient also exhibits some cognitive impairment - perseverating on getting food, difficulty maintaining attention and though alert to self, place and year wasn't able to answer questions with detail or comprehend that therapist could not get him [place] food. Use of interpreter - but still limited. Unsure of patient's true abilities as he is not a reliable historian and will need to follow up with familiar in regards to [place]. Patient will benefit from skilled OT services while in hospital to improve deficits and learn compensatory strategies as needed in order to return to PLOF.  Expect patient could still return home with 24/7 assistance and home health.     Follow Up Recommendations  Home health OT    Equipment Recommendations  3 in 1 bedside commode    Recommendations for Other Services       Precautions / Restrictions Precautions Precautions: Fall Precaution Comments: use interpreter-Gujarati Restrictions Weight Bearing Restrictions: No      Mobility Bed Mobility Overal bed mobility: Needs Assistance Bed Mobility: Supine to Sit;Sit to Supine   Sidelying to  sit: Mod assist Supine to sit: Mod assist     General bed mobility comments: assist trunk to sit upright. Mod assist for LEs to transfer back in to bed.    Transfers   Equipment used: Rolling walker (2 wheeled) Transfers: Sit to/from Stand Sit to Stand: Min assist Stand pivot transfers: Min assist       General transfer comment: Min assist to stand, min assist to ambulate in room with RW with abnormal gait - dragging right foot and needing assistance for walker management.    Balance Overall balance assessment: Needs assistance Sitting-balance support: No upper extremity supported Sitting balance-Leahy Scale: Good     Standing balance support: During functional activity Standing balance-Leahy Scale: Poor                             ADL either performed or assessed with clinical judgement   ADL Overall ADL's : Needs assistance/impaired Eating/Feeding: Set up   Grooming: Set up;Wash/dry face   Upper Body Bathing: Sitting;Moderate assistance   Lower Body Bathing: Sit to/from stand;Moderate assistance   Upper Body Dressing : Set up;Sitting;Moderate assistance   Lower Body Dressing: Sit to/from stand;Moderate assistance   Toilet Transfer: Minimal assistance;RW;Regular Toilet   Toileting- Clothing Manipulation and Hygiene: Set up;Moderate assistance       Functional mobility during ADLs: Rolling walker;Minimal assistance       Vision Patient Visual Report: No change from baseline Vision Assessment?: No apparent visual deficits     Perception     Praxis      Pertinent Vitals/Pain Pain Assessment: No/denies pain Faces Pain Scale: No hurt  Hand Dominance Right (since stroke - reliant on left.)   Extremity/Trunk Assessment Upper Extremity Assessment Upper Extremity Assessment: RUE deficits/detail;LUE deficits/detail RUE Deficits / Details: WFL ROM, grossly functional strength. Hard to assess due to unable to follow MMT commands. RUE  Sensation: WNL RUE Coordination: WNL LUE Deficits / Details: grossly functional ROM but decreased coordination and strength. LUE Coordination: decreased fine motor;decreased gross motor   Lower Extremity Assessment Lower Extremity Assessment: Defer to PT evaluation   Cervical / Trunk Assessment Cervical / Trunk Assessment: Normal   Communication Communication Communication: Prefers language other than English   Cognition Arousal/Alertness: Awake/alert Behavior During Therapy: WFL for tasks assessed/performed Overall Cognitive Status: Difficult to assess                                 General Comments: Perseverates on food and wanting to eat. Keeps asking for [place] food and is unable to comprehend that he can't get that in hospital. Knows he is in the hospital and the year.   General Comments       Exercises     Shoulder Instructions      Home Living Family/patient expects to be discharged to:: Private residence Living Arrangements: Children;Spouse/significant other Available Help at Discharge: Family;Available 24 hours/day Type of Home: House   Entrance Stairs-Number of Steps: unsure   Home Layout: One level     Bathroom Shower/Tub: Teacher, early years/pre: Standard     Home Equipment: Environmental consultant - 2 wheels   Additional Comments: Use of intepreter but unsure of reliability of answers.      Prior Functioning/Environment Level of Independence: Needs assistance  Gait / Transfers Assistance Needed: use of walker. reports walking is very limited to approx 10 feet in home. ADL's / Homemaking Assistance Needed: has assistance with ADLs. Reports he can walk to the bathroom.            OT Problem List: Decreased activity tolerance;Impaired balance (sitting and/or standing);Pain;Decreased safety awareness;Decreased strength;Decreased range of motion;Decreased coordination;Decreased knowledge of use of DME or AE;Decreased cognition;Impaired UE  functional use      OT Treatment/Interventions: Self-care/ADL training;DME and/or AE instruction;Therapeutic activities;Balance training;Patient/family education;Neuromuscular education;Therapeutic exercise    OT Goals(Current goals can be found in the care plan section) Acute Rehab OT Goals OT Goal Formulation: Patient unable to participate in goal setting Time For Goal Achievement: 03/13/21 Potential to Achieve Goals: Fair  OT Frequency: Min 2X/week   Barriers to D/C:            Co-evaluation PT/OT/SLP Co-Evaluation/Treatment: Yes Reason for Co-Treatment: For patient/therapist safety;To address functional/ADL transfers;Necessary to address cognition/behavior during functional activity (co-eval)          AM-PAC OT "6 Clicks" Daily Activity     Outcome Measure Help from another person eating meals?: A Little Help from another person taking care of personal grooming?: A Little Help from another person toileting, which includes using toliet, bedpan, or urinal?: A Lot Help from another person bathing (including washing, rinsing, drying)?: A Lot Help from another person to put on and taking off regular upper body clothing?: A Lot Help from another person to put on and taking off regular lower body clothing?: A Lot 6 Click Score: 14   End of Session Equipment Utilized During Treatment: Rolling walker Nurse Communication: Mobility status  Activity Tolerance: Patient tolerated treatment well Patient left: in bed;with call bell/phone within reach;with bed alarm set  OT Visit Diagnosis: Other abnormalities of gait and mobility (R26.89);Pain;Muscle weakness (generalized) (M62.81);Hemiplegia and hemiparesis Hemiplegia - Right/Left: Right Hemiplegia - dominant/non-dominant: Dominant Hemiplegia - caused by: Cerebral infarction                Time: 1210-1234 OT Time Calculation (min): 24 min Charges:  OT General Charges $OT Visit: 1 Visit OT Evaluation $OT Eval Moderate Complexity:  1 Mod  Alexyia Guarino, OTR/L [place]  Office [phone] Pager: [place] 02/27/2021, 1:19 PM

## 2021-02-28 DIAGNOSIS — B0089 Other herpesviral infection: Secondary | ICD-10-CM | POA: Diagnosis present

## 2021-02-28 DIAGNOSIS — K208 Other esophagitis without bleeding: Secondary | ICD-10-CM | POA: Diagnosis present

## 2021-02-28 LAB — HIV ANTIBODY (ROUTINE TESTING W REFLEX): HIV Screen 4th Generation wRfx: NONREACTIVE

## 2021-02-28 LAB — URINALYSIS, ROUTINE W REFLEX MICROSCOPIC
Bilirubin Urine: NEGATIVE
Glucose, UA: 100 mg/dL — AB
Ketones, ur: NEGATIVE mg/dL
Leukocytes,Ua: NEGATIVE
Nitrite: NEGATIVE
Protein, ur: >300 mg/dL — AB
Specific Gravity, Urine: 1.015 (ref 1.005–1.030)
pH: 6.5 (ref 5.0–8.0)

## 2021-02-28 LAB — BASIC METABOLIC PANEL
Anion gap: 5 (ref 5–15)
BUN: 18 mg/dL (ref 8–23)
CO2: 25 mmol/L (ref 22–32)
Calcium: 7.5 mg/dL — ABNORMAL LOW (ref 8.9–10.3)
Chloride: 110 mmol/L (ref 98–111)
Creatinine, Ser: 2.19 mg/dL — ABNORMAL HIGH (ref 0.61–1.24)
GFR, Estimated: 29 mL/min — ABNORMAL LOW (ref >60)
Glucose, Bld: 98 mg/dL (ref 70–99)
Potassium: 3.2 mmol/L — ABNORMAL LOW (ref 3.5–5.1)
Sodium: 140 mmol/L (ref 135–145)

## 2021-02-28 LAB — URINE CULTURE: Culture: <10000 — AB

## 2021-02-28 MED ORDER — POTASSIUM CHLORIDE 20 MEQ PO PACK
20.0000 meq | PACK | Freq: Two times a day (BID) | ORAL | Status: AC
  Administered 2021-02-28 – 2021-03-01 (×4): 20 meq via ORAL
  Filled 2021-02-28 (×4): qty 1

## 2021-02-28 MED ORDER — AMLODIPINE BESYLATE 10 MG PO TABS
10.0000 mg | ORAL_TABLET | Freq: Every day | ORAL | Status: DC
  Administered 2021-02-28 – 2021-03-03 (×4): 10 mg via ORAL
  Filled 2021-02-28 (×4): qty 1

## 2021-02-28 MED ORDER — POTASSIUM CHLORIDE 20 MEQ PO PACK
20.0000 meq | PACK | Freq: Once | ORAL | Status: AC
  Administered 2021-02-28: 20 meq via ORAL
  Filled 2021-02-28: qty 1

## 2021-02-28 MED ORDER — HYDRALAZINE HCL 20 MG/ML IJ SOLN
20.0000 mg | INTRAMUSCULAR | Status: AC | PRN
  Administered 2021-02-28 – 2021-03-02 (×2): 20 mg via INTRAVENOUS
  Filled 2021-02-28 (×2): qty 1

## 2021-02-28 NOTE — Plan of Care (Signed)
  Problem: Education: Goal: Knowledge of General Education information will improve Description: Including pain rating scale, medication(s)/side effects and non-pharmacologic comfort measures Outcome: Not Progressing   

## 2021-02-28 NOTE — Progress Notes (Signed)
Subjective: Eating food voraciously as I entered room.  Objective: Vital signs in last 24 hours: Temp:  [97.3 F (36.3 C)-98.1 F (36.7 C)] 97.7 F (36.5 C) (09/03 0859) Pulse Rate:  [74-107] 107 (09/03 0930) Resp:  [8-22] 14 (09/03 0930) BP: (99-203)/(63-131) 127/78 (09/03 0930) SpO2:  [95 %-100 %] 98 % (09/03 0930) Weight:  [61.9 kg] 61.9 kg (09/03 0415) Weight change: 3.794 kg Last BM Date: 02/14/21  PE: GEN:  NAD  Lab Results: CBC    Component Value Date/Time   WBC 10.9 (H) 02/27/2021 0332   RBC 4.65 02/27/2021 0332   HGB 11.5 (L) 02/27/2021 0332   HCT 35.4 (L) 02/27/2021 0332   PLT 294 02/27/2021 0332   MCV 76.1 (L) 02/27/2021 0332   MCH 24.7 (L) 02/27/2021 0332   MCHC 32.5 02/27/2021 0332   RDW 22.9 (H) 02/27/2021 0332   LYMPHSABS 1.9 02/27/2021 0332   MONOABS 0.5 02/27/2021 0332   EOSABS 0.2 02/27/2021 0332   BASOSABS 0.1 02/27/2021 0332  CMP     Component Value Date/Time   NA 140 02/28/2021 0227   K 3.2 (L) 02/28/2021 0227   CL 110 02/28/2021 0227   CO2 25 02/28/2021 0227   GLUCOSE 98 02/28/2021 0227   GLUCOSE 108 (H) 07/07/2006 0956   BUN 18 02/28/2021 0227   CREATININE 2.19 (H) 02/28/2021 0227   CALCIUM 7.5 (L) 02/28/2021 0227   PROT 4.9 (L) 02/27/2021 0332   ALBUMIN 2.3 (L) 02/27/2021 0332   AST 23 02/27/2021 0332   ALT 16 02/27/2021 0332   ALKPHOS 61 02/27/2021 0332   BILITOT 0.7 02/27/2021 0332   GFRNONAA 29 (L) 02/28/2021 0227   GFRAA 55 (L) 05/09/2017 1522    Assessment:   Failure to thrive. HSV esophagitis. Protein-calorie malnutrition.  Plan:   Continue IV acyclovir.  Diet as tolerated (was doing well with breakfast this morning). [facility] to revisit Tuesday 9/6, at which point we can better assess whether or not PEG tube (or something else) is needed for patient's failure-to-thrive and malnutrition.   Logan [name] 02/28/2021, 9:39 AM   Cell [phone] If no answer or after 5 PM call [phone]

## 2021-02-28 NOTE — Progress Notes (Addendum)
PROGRESS NOTE    Logan [name]  [id number] DOB: 31-Mar-1935 DOA: 02/26/2021 PCP: Jilda Panda, MD    Brief Narrative:  86 year old gentleman with history of chronic anemia of iron deficiency, history of esophageal ulcer hiatal hernia, GERD, hyperlipidemia and previous GI bleeding brought from home with failure to thrive, not eating well and not walking well as well constipation for last 10 days.  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  Unable to take much of the medication by mouth.  Home health nurse reported weight loss so sent to the hospital.   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 Started on IV acyclovir along with lidocaine viscous.  Continue Protonix. Encourage oral intake, can eat food from home.  Stay on soft diet with aspiration precautions. Calorie count. Reportedly acute event, hopefully will improve.  Hopefully he will not need any PEG tube. Acute hepatitis panel negative.  HIV screening pending.  Hypertensive urgency: Presented with blood pressure of 240s/100.  Only on carvedilol at home.  Blood pressures acceptable on amlodipine and home doses of carvedilol.   Iron deficiency anemia: Recently diagnosed.  Hemoglobin is 11 and adequate.  Will give 1 dose of IV iron when he is in the hospital.  Electrolytes: Hypokalemia.  Replaced.  Monitor.  CKD stage IV: At about his baseline.  Creatinine 2.19 which is historically at baseline.  Nutrition Status: Nutrition Problem: Moderate Malnutrition Etiology: chronic illness Signs/Symptoms: mild fat depletion, mild muscle depletion Interventions: Ensure Enlive (each supplement provides 350kcal and 20 grams of protein), MVI    Stabilizing.  Can transfer to  telemetry bed.   DVT prophylaxis: heparin injection 5,000 Units Start: 02/27/21 0600 SCDs Start: 02/27/21 0103   Code Status: Full code Family Communication: None today.  We will meet with family. Disposition Plan: Status is: Inpatient  Remains inpatient appropriate because:IV treatments appropriate due to intensity of illness or inability to take PO and Inpatient level of care appropriate due to severity of illness  Dispo: The patient is from: Home              Anticipated d/c is to: Home with home health.              Patient currently is not medically stable to d/c.   Difficult to place patient No         Consultants:  Gastroenterology  Procedures:  None  Antimicrobials:  Acyclovir 9/2---   Subjective: Patient seen examined in the morning rounds.  He was sleepy.  Woke up and denied any complaints to me.  Nursing reported not eating well and not able to swallow all food well.  Feels weak.  Objective: Vitals:   02/28/21 0530 02/28/21 0600 02/28/21 0630 02/28/21 0700  BP: (!) 141/83 (!) 159/95 (!) 173/131 99/63  Pulse: 76 94 85 79  Resp: '13 13 15 15  '$ Temp:      TempSrc:      SpO2: 98% 98% 96% 96%  Weight:      Height:        Intake/Output Summary (Last 24 hours) at 02/28/2021 0809 Last data filed at 02/28/2021 0552 Gross per 24 hour  Intake 1793.2 ml  Output 590 ml  Net 1203.2 ml   Filed Weights   02/26/21 1445 02/27/21 0100 02/28/21 0415  Weight:  58.1 kg 59.4 kg 61.9 kg    Examination:  General exam: Looks comfortable on room air.  Frail and debilitated but not in any distress. Sleepy now.  Received trazodone last night. Respiratory system: No added sounds.   Cardiovascular system: S1 & S2 heard, RRR. No JVD, murmurs, rubs, gallops or clicks.  Trace pedal edema. Gastrointestinal system: Abdomen is nondistended, soft and nontender. No organomegaly or masses felt. Normal bowel sounds heard. Central nervous system: Alert and oriented. No focal  neurological deficits. Extremities: Symmetric 5 x 5 power. Skin: No rashes, lesions or ulcers Psychiatry: Judgement and insight appear normal. Mood & affect appropriate.     Data Reviewed: I have personally reviewed following labs and imaging studies  CBC: Recent Labs  Lab 02/26/21 1541 02/27/21 0332  WBC 8.6 10.9*  NEUTROABS 5.7 8.2*  HGB 10.8* 11.5*  HCT 35.2* 35.4*  MCV 78.4* 76.1*  PLT 308 [id number]   Basic Metabolic Panel: Recent Labs  Lab 02/26/21 1541 02/27/21 0332 02/28/21 0227  NA 147* 142 140  K 3.0* 3.7 3.2*  CL 113* 112* 110  CO2 '28 24 25  '$ GLUCOSE 105* 124* 98  BUN '16 16 18  '$ CREATININE 2.37* 2.19* 2.19*  CALCIUM 8.0* 7.7* 7.5*  MG  --  1.9  --   PHOS  --  3.4  --    GFR: Estimated Creatinine Clearance: 22.4 mL/min (A) (by C-G formula based on SCr of 2.19 mg/dL (H)). Liver Function Tests: Recent Labs  Lab 02/26/21 1541 02/27/21 0332  AST 24 23  ALT 16 16  ALKPHOS 64 61  BILITOT 0.5 0.7  PROT 5.0* 4.9*  ALBUMIN 2.3* 2.3*   Recent Labs  Lab 02/26/21 1820  LIPASE 23   No results for input(s): AMMONIA in the last 168 hours. Coagulation Profile: Recent Labs  Lab 02/26/21 1541  INR 1.0   Cardiac Enzymes: No results for input(s): CKTOTAL, CKMB, CKMBINDEX, TROPONINI in the last 168 hours. BNP (last 3 results) No results for input(s): PROBNP in the last 8760 hours. HbA1C: No results for input(s): HGBA1C in the last 72 hours. CBG: No results for input(s): GLUCAP in the last 168 hours. Lipid Profile: No results for input(s): CHOL, HDL, LDLCALC, TRIG, CHOLHDL, LDLDIRECT in the last 72 hours. Thyroid Function Tests: Recent Labs    02/27/21 0332  TSH 6.135*   Anemia Panel: No results for input(s): VITAMINB12, FOLATE, FERRITIN, TIBC, IRON, RETICCTPCT in the last 72 hours. Sepsis Labs: No results for input(s): PROCALCITON, LATICACIDVEN in the last 168 hours.  Recent Results (from the past 240 hour(s))  SARS CORONAVIRUS 2 (TAT 6-24 HRS)  Nasopharyngeal Nasopharyngeal Swab     Status: None   Collection Time: 02/27/21 12:31 AM   Specimen: Nasopharyngeal Swab  Result Value Ref Range Status   SARS Coronavirus 2 NEGATIVE NEGATIVE Final    Comment: (NOTE) SARS-CoV-2 target nucleic acids are NOT DETECTED.  The SARS-CoV-2 RNA is generally detectable in upper and lower respiratory specimens during the acute phase of infection. Negative results do not preclude SARS-CoV-2 infection, do not rule out co-infections with other pathogens, and should not be used as the sole basis for treatment or other patient management decisions. Negative results must be combined with clinical observations, patient history, and epidemiological information. The expected result is Negative.  Fact Sheet for Patients: SugarRoll.be  Fact Sheet for Healthcare Providers: https://www.woods-mathews.com/  This test is not yet approved or cleared by the [place] FDA and  has been authorized for detection and/or  diagnosis of SARS-CoV-2 by FDA under an Emergency Use Authorization (EUA). This EUA will remain  in effect (meaning this test can be used) for the duration of the COVID-19 declaration under Se ction 564(b)(1) of the Act, 21 U.S.C. section 360bbb-3(b)(1), unless the authorization is terminated or revoked sooner.  Performed at [facility], [place] [address], [place], [place] [address]   MRSA Next Gen by PCR, Nasal     Status: None   Collection Time: 02/27/21  1:03 AM   Specimen: Nasal Mucosa; Nasal Swab  Result Value Ref Range Status   MRSA by PCR Next Gen NOT DETECTED NOT DETECTED Final    Comment: (NOTE) The GeneXpert MRSA Assay (FDA approved for NASAL specimens only), is one component of a comprehensive MRSA colonization surveillance program. It is not intended to diagnose MRSA infection nor to guide or monitor treatment for MRSA infections. Test performance is not FDA approved in patients  less than 28 years old. Performed at [facility], [place] [address] [place],  [address]          Radiology Studies: CT ABDOMEN PELVIS WO CONTRAST  Result Date: 02/26/2021 CLINICAL DATA:  Weakness, weight loss and poor oral intake. EXAM: CT ABDOMEN AND PELVIS WITHOUT CONTRAST TECHNIQUE: Multidetector CT imaging of the abdomen and pelvis was performed following the standard protocol without IV contrast. COMPARISON:  February 10, 2021 FINDINGS: Lower chest: Mild to moderate severity areas of atelectasis and/or infiltrate are seen within the bilateral lung bases. Small bilateral pleural effusions are seen. A small pericardial effusion is present. Hepatobiliary: No focal liver abnormality is seen. No gallstones, gallbladder wall thickening, or biliary dilatation. Pancreas: Unremarkable. No pancreatic ductal dilatation or surrounding inflammatory changes. Spleen: Normal in size without focal abnormality. Adrenals/Urinary Tract: Adrenal glands are unremarkable. Kidneys are normal, without renal calculi, focal lesion, or hydronephrosis. Bladder is unremarkable. Stomach/Bowel: Stomach is within normal limits. Appendix appears normal. No evidence of bowel wall thickening, distention, or inflammatory changes. Vascular/Lymphatic: Aortic atherosclerosis. No enlarged abdominal or pelvic lymph nodes. Reproductive: The prostate gland is mildly enlarged. Other: No abdominal wall hernia or abnormality. No abdominopelvic ascites. Musculoskeletal: A total right hip replacement is seen with associated streak artifact and subsequently limited evaluation of the adjacent osseous and soft tissue structures. Degenerative changes seen throughout the lumbar spine. IMPRESSION: 1. Mild to moderate severity bibasilar atelectasis and/or infiltrate with small bilateral pleural effusions. 2. Small pericardial effusion. 3. Mildly enlarged prostate gland. 4. Total right hip replacement. 5. Aortic atherosclerosis.  Aortic Atherosclerosis (ICD10-I70.0). Electronically Signed   By: Virgina Norfolk M.D.   On: 02/26/2021 18:35   CT HEAD WO CONTRAST (5MM)  Result Date: 02/26/2021 CLINICAL DATA:  Weakness, poor oral intake and 10 pound weight loss in 2 days. EXAM: CT HEAD WITHOUT CONTRAST TECHNIQUE: Contiguous axial images were obtained from the base of the skull through the vertex without intravenous contrast. COMPARISON:  None. FINDINGS: Brain: There is mild to moderate severity cerebral atrophy with widening of the extra-axial spaces and ventricular dilatation. There are areas of decreased attenuation within the white matter tracts of the supratentorial brain, consistent with microvascular disease changes. Chronic bilateral basal ganglia lacunar infarcts are noted. Vascular: No hyperdense vessel or unexpected calcification. Skull: Normal. Negative for fracture or focal lesion. Sinuses/Orbits: No acute finding. 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 IMPRESSION: 1. Generalized cerebral atrophy. 2. Chronic bilateral basal ganglia lacunar infarcts. 3. No acute intracranial abnormality. Electronically Signed  By: Virgina Norfolk M.D.   On: 02/26/2021 18:37   VAS [place] LOWER EXTREMITY VENOUS (DVT) (ONLY [facility] & [facility])  Result Date: 02/27/2021  Lower Venous DVT Study Patient Name:  ELAINE [facility]  Date of Exam:   02/26/2021 Medical Rec #: [id number]      Accession #:    [id number] Date of Birth: [birth year]     Patient Gender: M Patient Age:   62 years Exam Location:  [facility] Procedure:      VAS [place] LOWER EXTREMITY VENOUS (DVT) Referring Phys: Gareth Morgan --------------------------------------------------------------------------------  Indications: Swelling.  Risk Factors: Cancer. Limitations: Poor ultrasound/tissue interface. Comparison Study: No prior studies. Performing Technologist: Oliver Hum RVT  Examination Guidelines: A complete evaluation includes B-mode imaging, spectral Doppler, color  Doppler, and power Doppler as needed of all accessible portions of each vessel. Bilateral testing is considered an integral part of a complete examination. Limited examinations for reoccurring indications may be performed as noted. The reflux portion of the exam is performed with the patient in reverse Trendelenburg.  +---------+---------------+---------+-----------+----------+--------------+ RIGHT    CompressibilityPhasicitySpontaneityPropertiesThrombus Aging +---------+---------------+---------+-----------+----------+--------------+ CFV      Full           Yes      Yes                                 +---------+---------------+---------+-----------+----------+--------------+ SFJ      Full                                                        +---------+---------------+---------+-----------+----------+--------------+ FV Prox  Full                                                        +---------+---------------+---------+-----------+----------+--------------+ FV Mid   Full                                                        +---------+---------------+---------+-----------+----------+--------------+ FV DistalFull                                                        +---------+---------------+---------+-----------+----------+--------------+ PFV      Full                                                        +---------+---------------+---------+-----------+----------+--------------+ POP      Full           Yes      Yes                                 +---------+---------------+---------+-----------+----------+--------------+  PTV      Full                                                        +---------+---------------+---------+-----------+----------+--------------+ PERO     Full                                                        +---------+---------------+---------+-----------+----------+--------------+    +----+---------------+---------+-----------+----------+--------------+ LEFTCompressibilityPhasicitySpontaneityPropertiesThrombus Aging +----+---------------+---------+-----------+----------+--------------+ CFV Full           Yes      Yes                                 +----+---------------+---------+-----------+----------+--------------+     Summary: RIGHT: - There is no evidence of deep vein thrombosis in the lower extremity.  - No cystic structure found in the popliteal fossa.  LEFT: - No evidence of common femoral vein obstruction.  *See table(s) above for measurements and observations. Electronically signed by Jamelle Haring on 02/27/2021 at 7:46:50 AM.    Final    UE Venous Duplex ([facility] and [facility] ONLY)  Result Date: 02/27/2021 UPPER VENOUS STUDY  Patient Name:  [facility]  Date of Exam:   02/26/2021 Medical Rec #: [id number]      Accession #:    [id number] Date of Birth: [birth year]     Patient Gender: M Patient Age:   22 years Exam Location:  [facility] Procedure:      VAS [place] UPPER EXTREMITY VENOUS DUPLEX Referring Phys: Gareth Morgan --------------------------------------------------------------------------------  Indications: Swelling Risk Factors: Cancer. Comparison Study: No prior studies. Performing Technologist: Oliver Hum RVT  Examination Guidelines: A complete evaluation includes B-mode imaging, spectral Doppler, color Doppler, and power Doppler as needed of all accessible portions of each vessel. Bilateral testing is considered an integral part of a complete examination. Limited examinations for reoccurring indications may be performed as noted.  Right Findings: +----------+------------+---------+-----------+----------+-------+ RIGHT     CompressiblePhasicitySpontaneousPropertiesSummary +----------+------------+---------+-----------+----------+-------+ IJV           Full       Yes       Yes                       +----------+------------+---------+-----------+----------+-------+ Subclavian    Full       Yes       Yes                      +----------+------------+---------+-----------+----------+-------+ Axillary      Full       Yes       Yes                      +----------+------------+---------+-----------+----------+-------+ Brachial      Full       Yes       Yes                      +----------+------------+---------+-----------+----------+-------+ Radial        Full                                          +----------+------------+---------+-----------+----------+-------+  Ulnar         Full                                          +----------+------------+---------+-----------+----------+-------+ Cephalic      Full                                          +----------+------------+---------+-----------+----------+-------+ Basilic       Full                                          +----------+------------+---------+-----------+----------+-------+  Left Findings: +----------+------------+---------+-----------+----------+-------+ LEFT      CompressiblePhasicitySpontaneousPropertiesSummary +----------+------------+---------+-----------+----------+-------+ Subclavian    Full       Yes       Yes                      +----------+------------+---------+-----------+----------+-------+  Summary:  Right: No evidence of deep vein thrombosis in the upper extremity. No evidence of superficial vein thrombosis in the upper extremity.  Left: No evidence of thrombosis in the subclavian.  *See table(s) above for measurements and observations.  Diagnosing physician: Jamelle Haring Electronically signed by Jamelle Haring on 02/27/2021 at 7:46:37 AM.    Final         Scheduled Meds:  amLODipine  5 mg Oral Daily   atorvastatin  20 mg Oral Daily   carvedilol  6.25 mg Oral BID   Chlorhexidine Gluconate Cloth  6 each Topical Daily   feeding supplement  237 mL Oral BID BM    heparin  5,000 Units Subcutaneous Q8H   lidocaine  15 mL Mouth/Throat TID AC   mouth rinse  15 mL Mouth Rinse BID   metoCLOPramide  5 mg Oral QID   multivitamin with minerals  1 tablet Oral Daily   pantoprazole  40 mg Oral BID   polyethylene glycol  17 g Oral Daily   Continuous Infusions:  acyclovir Stopped (02/27/21 1624)   lactated ringers 125 mL/hr at 02/28/21 0552     LOS: 2 days    Time spent: 32 minutes     Barb Merino, MD [facility] Pager [phone]

## 2021-03-01 LAB — CBC WITH DIFFERENTIAL/PLATELET
Abs Immature Granulocytes: 0.03 10*3/uL (ref 0.00–0.07)
Basophils Absolute: 0.1 10*3/uL (ref 0.0–0.1)
Basophils Relative: 1 %
Eosinophils Absolute: 0.3 10*3/uL (ref 0.0–0.5)
Eosinophils Relative: 3 %
HCT: 33.7 % — ABNORMAL LOW (ref 39.0–52.0)
Hemoglobin: 10.7 g/dL — ABNORMAL LOW (ref 13.0–17.0)
Immature Granulocytes: 0 %
Lymphocytes Relative: 24 %
Lymphs Abs: 2.2 10*3/uL (ref 0.7–4.0)
MCH: 24.4 pg — ABNORMAL LOW (ref 26.0–34.0)
MCHC: 31.8 g/dL (ref 30.0–36.0)
MCV: 76.8 fL — ABNORMAL LOW (ref 80.0–100.0)
Monocytes Absolute: 0.6 10*3/uL (ref 0.1–1.0)
Monocytes Relative: 6 %
Neutro Abs: 6.1 10*3/uL (ref 1.7–7.7)
Neutrophils Relative %: 66 %
Platelets: 317 10*3/uL (ref 150–400)
RBC: 4.39 MIL/uL (ref 4.22–5.81)
RDW: 24.6 % — ABNORMAL HIGH (ref 11.5–15.5)
WBC: 9.2 10*3/uL (ref 4.0–10.5)
nRBC: 0 % (ref 0.0–0.2)

## 2021-03-01 LAB — BASIC METABOLIC PANEL
Anion gap: 4 — ABNORMAL LOW (ref 5–15)
BUN: 17 mg/dL (ref 8–23)
CO2: 23 mmol/L (ref 22–32)
Calcium: 7.7 mg/dL — ABNORMAL LOW (ref 8.9–10.3)
Chloride: 109 mmol/L (ref 98–111)
Creatinine, Ser: 2.27 mg/dL — ABNORMAL HIGH (ref 0.61–1.24)
GFR, Estimated: 28 mL/min — ABNORMAL LOW (ref >60)
Glucose, Bld: 88 mg/dL (ref 70–99)
Potassium: 3.6 mmol/L (ref 3.5–5.1)
Sodium: 136 mmol/L (ref 135–145)

## 2021-03-01 LAB — PHOSPHORUS: Phosphorus: 3.3 mg/dL (ref 2.5–4.6)

## 2021-03-01 LAB — MAGNESIUM: Magnesium: 1.8 mg/dL (ref 1.7–2.4)

## 2021-03-01 MED ORDER — ACYCLOVIR 200 MG/5ML PO SUSP: 300.0000 mg | Freq: Three times a day (TID) | ORAL | Status: DC

## 2021-03-01 MED ORDER — ACYCLOVIR 200 MG/5ML PO SUSP
200.0000 mg | Freq: Three times a day (TID) | ORAL | Status: DC
  Administered 2021-03-01 – 2021-03-03 (×7): 200 mg via ORAL
  Filled 2021-03-01 (×8): qty 10

## 2021-03-01 MED ORDER — ACYCLOVIR 200 MG/5ML PO SUSP
200.0000 mg | Freq: Three times a day (TID) | ORAL | Status: DC
  Filled 2021-03-01: qty 10

## 2021-03-01 NOTE — Progress Notes (Addendum)
PHARMACY NOTE:  ANTIMICROBIAL RENAL DOSAGE ADJUSTMENT  Current antimicrobial regimen includes a mismatch between antimicrobial dosage and estimated renal function.  As per policy approved by the Pharmacy & Therapeutics and Medical Executive Committees, the antimicrobial dosage will be adjusted accordingly.  Current antimicrobial dosage:  Acyclovir '300mg'$  PO (oral solution) TID  Indication: herpes simplex esophagitis  Renal Function:  Estimated Creatinine Clearance: 21.6 mL/min (A) (by C-G formula based on SCr of 2.27 mg/dL (H)). '[]'$      On intermittent HD, scheduled: '[]'$      On CRRT    Antimicrobial dosage has been changed to:  Acyclovir '200mg'$  PO (oral solution) q8h (discussed with Dr. Sloan Leiter)   Thank you for allowing pharmacy to be a part of this patient's care.  Luiz Ochoa, [facility] 03/01/2021 12:10 PM

## 2021-03-01 NOTE — Progress Notes (Signed)
PROGRESS NOTE    Logan [name]  [id number] DOB: 1934-09-03 DOA: 02/26/2021 PCP: Jilda Panda, MD    Brief Narrative:  86 year old gentleman with history of chronic anemia of iron deficiency, history of esophageal ulcer hiatal hernia, GERD, hyperlipidemia and previous GI bleeding brought from home with failure to thrive, not eating well and not walking well as well constipation for last 10 days.  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  Unable to take much of the medication by mouth.  Home health nurse reported weight loss so sent to the hospital.   Assessment & Plan:   Active Problems:   Failure to thrive in adult   Herpes simplex esophagitis  Failure to thrive/multifactorial nutrition problems/dysphagia secondary to herpes esophagitis: Recently diagnosed with herpes esophagitis, intolerance to oral acyclovir. Treated with IV acyclovir and lidocaine viscous.  Already improving.  Continue Protonix. Challenge with acyclovir syrup today, if able to tolerate will treat for 10 days with oral therapy. Encourage oral intake.  Mostly improving today. HIV negative.  Likely exacerbated due to poor nutrition. Initial assessment for PEG tube placement, likely will not need it.  Hypertensive urgency: Presented with blood pressure of 240s/100.  Only on carvedilol at home.  Blood pressures acceptable on amlodipine and home doses of carvedilol.  Improved now.  Iron deficiency anemia: Recently diagnosed.  Hemoglobin is 11 and adequate.  Will give 1 dose of IV iron when he is in the hospital.  Hemoglobin is adequate.  Electrolytes: Hypokalemia.  Replaced.  Adequate.  CKD stage IV: At about his baseline.  At about baseline.  Nutrition Status: Nutrition Problem: Moderate Malnutrition Etiology: chronic illness Signs/Symptoms: mild fat depletion, mild muscle depletion Interventions: Ensure Enlive (each supplement provides 350kcal  and 20 grams of protein), MVI    Increase mobility.  If tolerates acyclovir syrup, will discharge home.   DVT prophylaxis: heparin injection 5,000 Units Start: 02/27/21 0600 SCDs Start: 02/27/21 0103   Code Status: Full code Family Communication: None today.  We will meet with family. Disposition Plan: Status is: Inpatient  Remains inpatient appropriate because:IV treatments appropriate due to intensity of illness or inability to take PO and Inpatient level of care appropriate due to severity of illness  Dispo: The patient is from: Home              Anticipated d/c is to: Home with home health PT/OT.              Patient currently is not medically stable to d/c.   Difficult to place patient No         Consultants:  Gastroenterology  Procedures:  None  Antimicrobials:  Acyclovir 9/2---   Subjective: Patient seen and examined.  Reportedly no trouble eating home food.  No family at bedside.  He is able to communicate clearly and his language.  Denies any complaints.  Denies any painful swallowing.  Objective: Vitals:   02/28/21 1030 02/28/21 1137 02/28/21 2013 03/01/21 0449  BP: 127/63 135/74 (!) 156/104 (!) 161/89  Pulse: 96 93 (!) 102 93  Resp: '13 20 20 18  '$ Temp:  97.8 F (36.6 C)  98.3 F (36.8 C)  TempSrc:  Oral  Oral  SpO2: 96% 100%  99%  Weight:      Height:        Intake/Output Summary (Last 24 hours) at 03/01/2021 1139 Last data filed at 03/01/2021 1100 Gross per 24 hour  Intake 3609.64 ml  Output  200 ml  Net 3409.64 ml   Filed Weights   02/26/21 1445 02/27/21 0100 02/28/21 0415  Weight: 58.1 kg 59.4 kg 61.9 kg    Examination:  General exam: Looks comfortable on room air.  Frail and debilitated but not in any distress.  Awake and pleasant. Respiratory system: No added sounds.   Cardiovascular system: S1 & S2 heard, RRR. No JVD, murmurs, rubs, gallops or clicks.  Trace pedal edema. Gastrointestinal system: Abdomen is nondistended, soft and  nontender. No organomegaly or masses felt. Normal bowel sounds heard. Central nervous system: Alert and oriented. No focal neurological deficits. Extremities: Symmetric 5 x 5 power. Skin: No rashes, lesions or ulcers Psychiatry: Judgement and insight appear normal. Mood & affect appropriate.     Data Reviewed: I have personally reviewed following labs and imaging studies  CBC: Recent Labs  Lab 02/26/21 1541 02/27/21 0332 03/01/21 0523  WBC 8.6 10.9* 9.2  NEUTROABS 5.7 8.2* 6.1  HGB 10.8* 11.5* 10.7*  HCT 35.2* 35.4* 33.7*  MCV 78.4* 76.1* 76.8*  PLT 308 294 [id number]   Basic Metabolic Panel: Recent Labs  Lab 02/26/21 1541 02/27/21 0332 02/28/21 0227 03/01/21 0523  NA 147* 142 140 136  K 3.0* 3.7 3.2* 3.6  CL 113* 112* 110 109  CO2 '28 24 25 23  '$ GLUCOSE 105* 124* 98 88  BUN '16 16 18 17  '$ CREATININE 2.37* 2.19* 2.19* 2.27*  CALCIUM 8.0* 7.7* 7.5* 7.7*  MG  --  1.9  --  1.8  PHOS  --  3.4  --  3.3   GFR: Estimated Creatinine Clearance: 21.6 mL/min (A) (by C-G formula based on SCr of 2.27 mg/dL (H)). Liver Function Tests: Recent Labs  Lab 02/26/21 1541 02/27/21 0332  AST 24 23  ALT 16 16  ALKPHOS 64 61  BILITOT 0.5 0.7  PROT 5.0* 4.9*  ALBUMIN 2.3* 2.3*   Recent Labs  Lab 02/26/21 1820  LIPASE 23   No results for input(s): AMMONIA in the last 168 hours. Coagulation Profile: Recent Labs  Lab 02/26/21 1541  INR 1.0   Cardiac Enzymes: No results for input(s): CKTOTAL, CKMB, CKMBINDEX, TROPONINI in the last 168 hours. BNP (last 3 results) No results for input(s): PROBNP in the last 8760 hours. HbA1C: No results for input(s): HGBA1C in the last 72 hours. CBG: No results for input(s): GLUCAP in the last 168 hours. Lipid Profile: No results for input(s): CHOL, HDL, LDLCALC, TRIG, CHOLHDL, LDLDIRECT in the last 72 hours. Thyroid Function Tests: Recent Labs    02/27/21 0332  TSH 6.135*   Anemia Panel: No results for input(s): VITAMINB12, FOLATE,  FERRITIN, TIBC, IRON, RETICCTPCT in the last 72 hours. Sepsis Labs: No results for input(s): PROCALCITON, LATICACIDVEN in the last 168 hours.  Recent Results (from the past 240 hour(s))  Urine Culture     Status: Abnormal   Collection Time: 02/26/21  4:02 PM   Specimen: Urine, Clean Catch  Result Value Ref Range Status   Specimen Description   Final    URINE, CLEAN CATCH Performed at [facility], [place] [address] [place], [place] [address]    Special Requests   Final    NONE Performed at [facility], [place] [address], [place], [place] [address]    Culture (A)  Final    <10,000 COLONIES/mL INSIGNIFICANT GROWTH Performed at [place] [address] [place], [place] [address]    Report Status 02/28/2021 FINAL  Final  SARS CORONAVIRUS 2 (  TAT 6-24 HRS) Nasopharyngeal Nasopharyngeal Swab     Status: None   Collection Time: 02/27/21 12:31 AM   Specimen: Nasopharyngeal Swab  Result Value Ref Range Status   SARS Coronavirus 2 NEGATIVE NEGATIVE Final    Comment: (NOTE) SARS-CoV-2 target nucleic acids are NOT DETECTED.  The SARS-CoV-2 RNA is generally detectable in upper and lower respiratory specimens during the acute phase of infection. Negative results do not preclude SARS-CoV-2 infection, do not rule out co-infections with other pathogens, and should not be used as the sole basis for treatment or other patient management decisions. Negative results must be combined with clinical observations, patient history, and epidemiological information. The expected result is Negative.  Fact Sheet for Patients: SugarRoll.be  Fact Sheet for Healthcare Providers: https://www.woods-mathews.com/  This test is not yet approved or cleared by the [place] FDA and  has been authorized for detection and/or diagnosis of SARS-CoV-2 by FDA under an Emergency Use Authorization (EUA). This EUA will remain  in  effect (meaning this test can be used) for the duration of the COVID-19 declaration under Se ction 564(b)(1) of the Act, 21 U.S.C. section 360bbb-3(b)(1), unless the authorization is terminated or revoked sooner.  Performed at [facility], [place] [address] [place], [place] [address]   MRSA Next Gen by PCR, Nasal     Status: None   Collection Time: 02/27/21  1:03 AM   Specimen: Nasal Mucosa; Nasal Swab  Result Value Ref Range Status   MRSA by PCR Next Gen NOT DETECTED NOT DETECTED Final    Comment: (NOTE) The GeneXpert MRSA Assay (FDA approved for NASAL specimens only), is one component of a comprehensive MRSA colonization surveillance program. It is not intended to diagnose MRSA infection nor to guide or monitor treatment for MRSA infections. Test performance is not FDA approved in patients less than 11 years old. Performed at [facility], [place] [address] [place], [place] [address]          Radiology Studies: No results found.      Scheduled Meds:  acyclovir  300 mg Oral TID   amLODipine  10 mg Oral Daily   atorvastatin  20 mg Oral Daily   carvedilol  6.25 mg Oral BID   Chlorhexidine Gluconate Cloth  6 each Topical Daily   feeding supplement  237 mL Oral BID BM   heparin  5,000 Units Subcutaneous Q8H   lidocaine  15 mL Mouth/Throat TID AC   mouth rinse  15 mL Mouth Rinse BID   metoCLOPramide  5 mg Oral QID   multivitamin with minerals  1 tablet Oral Daily   pantoprazole  40 mg Oral BID   polyethylene glycol  17 g Oral Daily   potassium chloride  20 mEq Oral BID   Continuous Infusions:     LOS: 3 days    Time spent: 32 minutes     Barb Merino, MD [facility] Pager [phone]

## 2021-03-01 NOTE — Plan of Care (Signed)
  Problem: Education: Goal: Knowledge of General Education information will improve Description: Including pain rating scale, medication(s)/side effects and non-pharmacologic comfort measures Outcome: Progressing   Problem: Health Behavior/Discharge Planning: Goal: Ability to manage health-related needs will improve Outcome: Progressing   Problem: Clinical Measurements: Goal: Ability to maintain clinical measurements within normal limits will improve Outcome: Progressing Goal: Will remain free from infection Outcome: Progressing Note: IV zovirax changed to oral solution.  Goal: Diagnostic test results will improve Outcome: Progressing   Problem: Activity: Goal: Risk for activity intolerance will decrease Outcome: Progressing   Problem: Nutrition: Goal: Adequate nutrition will be maintained Outcome: Progressing   Problem: Coping: Goal: Level of anxiety will decrease Outcome: Progressing   Problem: Pain Managment: Goal: General experience of comfort will improve Outcome: Progressing   Problem: Safety: Goal: Ability to remain free from injury will improve Outcome: Progressing   Problem: Skin Integrity: Goal: Risk for impaired skin integrity will decrease Outcome: Progressing

## 2021-03-02 LAB — BASIC METABOLIC PANEL
Anion gap: 7 (ref 5–15)
BUN: 19 mg/dL (ref 8–23)
CO2: 23 mmol/L (ref 22–32)
Calcium: 7.6 mg/dL — ABNORMAL LOW (ref 8.9–10.3)
Chloride: 110 mmol/L (ref 98–111)
Creatinine, Ser: 2.21 mg/dL — ABNORMAL HIGH (ref 0.61–1.24)
GFR, Estimated: 29 mL/min — ABNORMAL LOW (ref >60)
Glucose, Bld: 79 mg/dL (ref 70–99)
Potassium: 3.7 mmol/L (ref 3.5–5.1)
Sodium: 140 mmol/L (ref 135–145)

## 2021-03-02 LAB — GLUCOSE, CAPILLARY: Glucose-Capillary: 82 mg/dL (ref 70–99)

## 2021-03-02 MED ORDER — CARVEDILOL 12.5 MG PO TABS
12.5000 mg | ORAL_TABLET | Freq: Two times a day (BID) | ORAL | Status: DC
  Administered 2021-03-02 – 2021-03-03 (×3): 12.5 mg via ORAL
  Filled 2021-03-02 (×3): qty 1

## 2021-03-02 MED ORDER — HYDRALAZINE HCL 20 MG/ML IJ SOLN: 20.0000 mg | INTRAMUSCULAR | Status: DC | PRN

## 2021-03-02 NOTE — Progress Notes (Addendum)
Physical Therapy Treatment Patient Details Name: Logan [name] MRN: [id number] DOB: 12/25/1934 Today's Date: 03/02/2021    History of Present Illness 86 year old man PMH gastric ulcer, GI bleed, chronic iron deficiency anemia , CVA,presenting with epigastric abdominal pain, nausea, vomiting, FTT, on 8/16. Admitted for CHF, acute on Chronic Kidney injury. EGD revealed  reflux esophagitis with no bleeding    PT Comments    General Comments: AxO x 2 used I Press photographer.  Following all commands and able to express needs. Assisted OOB to amb in hallway.   General transfer comment: VERY unsteady with Max VC's on proper hand placement and safety with turn completion prior to sit as pt tends to sit too early due to fatigue. General Gait Details: VERY unsteady gait with pronounced decreased WBing R LE "bad knee" and poor forward posture.  Limited distance by fatigue.  Deconditioned.  HIGH FALL RISK. Positioned in recliner to comfort and set up breakfast tray. Pt plans to return home and will need 24/7 assist/care.    Follow Up Recommendations  Home health PT;Supervision/Assistance - 24 hour     Equipment Recommendations  None recommended by PT    Recommendations for Other Services       Precautions / Restrictions Precautions Precautions: Fall Precaution Comments: use interpreter-Gujarati Restrictions: S/P CVA R Hemi Weight Bearing Restrictions: No    Mobility  Bed Mobility Overal bed mobility: Needs Assistance Bed Mobility: Supine to Sit     Supine to sit: Min assist;Mod assist     General bed mobility comments: assist fro trunk and increased time to complete.    Transfers Overall transfer level: Needs assistance Equipment used: Rolling walker (2 wheeled) Transfers: Sit to/from [facility] Sit to Stand: Min assist Stand pivot transfers: Min assist;Mod assist       General transfer comment: VERY unsteady with Max VC's on proper hand placement and  safety with turn completion prior to sit as pt tends to sit too early due to fatigue.  Ambulation/Gait Ambulation/Gait assistance: Min assist Gait Distance (Feet): 22 Feet Assistive device: Rolling walker (2 wheeled) Gait Pattern/deviations: Step-to pattern;Step-through pattern;Decreased step length - right;Decreased stance time - left;Shuffle;Trunk flexed Gait velocity: decr   General Gait Details: VERY unsteady gait with pronounced decreased WBing R LE "bad knee" and poor forward posture.  Limited distance by fatigue.  Deconditioned.  HIGH FALL RISK.   Stairs             Wheelchair Mobility    Modified Rankin (Stroke Patients Only)       Balance                                            Cognition Arousal/Alertness: Awake/alert Behavior During Therapy: WFL for tasks assessed/performed Overall Cognitive Status: Within Functional Limits for tasks assessed                                 General Comments: AxO x 2 used I Press photographer.  Following all commands and able to express needs.      Exercises      General Comments        Pertinent Vitals/Pain Pain Assessment: Faces Faces Pain Scale: Hurts a little bit Pain Location: states he has a "bad Right knee". Pain Descriptors / Indicators: Discomfort;Guarding Pain Intervention(s): Monitored during  session    Home Living                      Prior Function            PT Goals (current goals can now be found in the care plan section) Progress towards PT goals: Progressing toward goals    Frequency    Min 3X/week      PT Plan Current plan remains appropriate    Co-evaluation              AM-PAC PT "6 Clicks" Mobility   Outcome Measure  Help needed turning from your back to your side while in a flat bed without using bedrails?: A Little Help needed moving from lying on your back to sitting on the side of a flat bed without using bedrails?: A  Little Help needed moving to and from a bed to a chair (including a wheelchair)?: A Little Help needed standing up from a chair using your arms (e.g., wheelchair or bedside chair)?: A Little Help needed to walk in hospital room?: A Little Help needed climbing 3-5 steps with a railing? : A Lot 6 Click Score: 17    End of Session Equipment Utilized During Treatment: Gait belt Activity Tolerance: Patient limited by fatigue Patient left: in chair;with call bell/phone within reach;with chair alarm set Nurse Communication: Mobility status PT Visit Diagnosis: Unsteadiness on feet (R26.81);Muscle weakness (generalized) (M62.81);Difficulty in walking, not elsewhere classified (R26.2)     Time: [id number] PT Time Calculation (min) (ACUTE ONLY): 25 min  Charges:  $Gait Training: 8-22 mins $Therapeutic Activity: 8-22 mins                     {Delilah Mulgrew  PTA Acute  Rehabilitation Services Pager      [phone] Office      [phone]

## 2021-03-02 NOTE — Plan of Care (Signed)
Logan [name] has tolerated a soft vegetarian diet today.  Gujarati interpreter was used for most interactions with patient today. Logan [name] denies pain with eating/swallowing. He walked a short distance in the hall with PT today and sat in the chair for breakfast. He expresses that he is eager for d/c home tomorrow.   Problem: Education: Goal: Knowledge of General Education information will improve Description: Including pain rating scale, medication(s)/side effects and non-pharmacologic comfort measures Outcome: Progressing   Problem: Health Behavior/Discharge Planning: Goal: Ability to manage health-related needs will improve Outcome: Progressing   Problem: Clinical Measurements: Goal: Will remain free from infection Outcome: Progressing   Problem: Activity: Goal: Risk for activity intolerance will decrease Outcome: Progressing   Problem: Nutrition: Goal: Adequate nutrition will be maintained Outcome: Progressing

## 2021-03-02 NOTE — Progress Notes (Signed)
Patient is a PROGRESS NOTE    Logan [name]  [id number] DOB: 05-16-1935 DOA: 02/26/2021 PCP: Jilda Panda, MD    Brief Narrative:  86 year old gentleman with history of stroke with right hemiparesis, chronic anemia of iron deficiency, history of esophageal ulcer hiatal hernia, GERD, hyperlipidemia and previous GI bleeding brought from home with failure to thrive, not eating well and not walking well as well constipation for last 10 days.  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  Unable to take much of the medication by mouth.  Home health nurse reported weight loss so sent to the hospital.   Assessment & Plan:   Active Problems:   Failure to thrive in adult   Herpes simplex esophagitis  Failure to thrive/multifactorial nutrition problems/dysphagia secondary to herpes esophagitis: Recently diagnosed with herpes esophagitis, intolerance to oral acyclovir. Treated with IV acyclovir and lidocaine viscous.  Already improving.  Continue Protonix. Changed to acyclovir syrup 9/4 and patient is tolerating.   Oral intake has improved with addition of nutritional supplements and home food.    Hypertensive urgency: Presented with blood pressure of 240s/100.  Only on carvedilol at home.  Increased dose of carvedilol today to 12.5 mg twice a day, already on amlodipine 10 mg daily.  We will gradually uptitrate.  Iron deficiency anemia: Recently diagnosed.  Hemoglobin is more than 10 and adequate. Given a dose of IV iron in the hospital.  Recheck hemoglobin tomorrow morning.  Electrolytes: Hypokalemia.  Replaced.  Adequate.  CKD stage IV: At about his baseline.  At about baseline.  Right-sided hemiparesis and spasticity: Patient has history of a stroke resulting in right-sided hemiparesis and spasticity.  Chronic.  No new changes. CT scan of the head with chronic bilateral basal ganglia stroke.  Continue to work with PT OT.  Will  benefit with home health PT OT.  Nutrition Status: Nutrition Problem: Moderate Malnutrition Etiology: chronic illness Signs/Symptoms: mild fat depletion, mild muscle depletion Interventions: Ensure Enlive (each supplement provides 350kcal and 20 grams of protein), MVI   Communication Discussed with patient's son and daughter-in-law on the phone.  Looks like they have some unrealistic expectations of treatment and cure of chronic stable medical conditions. I discussed with him that if he is able to keep up with his nutrition and medications by mouth, he can go home tomorrow. He will need medical transport home.  DVT prophylaxis: heparin injection 5,000 Units Start: 02/27/21 0600 SCDs Start: 02/27/21 0103   Code Status: Full code Family Communication: Son and daughter-in-law on the phone. Disposition Plan: Status is: Inpatient  Remains inpatient appropriate because:IV treatments appropriate due to intensity of illness or inability to take PO and Inpatient level of care appropriate due to severity of illness  Dispo: The patient is from: Home              Anticipated d/c is to: Home with home health PT/OT.              Patient currently is not medically stable to d/c.   Difficult to place patient No         Consultants:  Gastroenterology  Procedures:  None  Antimicrobials:  Acyclovir 9/2---   Subjective: Patient seen and examined. He is without any complaints.  Denies any difficulty swallowing.  Looking forward to go home.  Objective: Vitals:   03/01/21 2059 03/02/21 0558 03/02/21 0600 03/02/21 0730  BP: (!) 160/88 (!) 192/95  (!) 148/84  Pulse: 78  90  95  Resp: 17 19    Temp: 98.7 F (37.1 C) 98.8 F (37.1 C)  98.1 F (36.7 C)  TempSrc:    Oral  SpO2: 99% 99%  100%  Weight:   62.1 kg   Height:        Intake/Output Summary (Last 24 hours) at 03/02/2021 1159 Last data filed at 03/02/2021 1000 Gross per 24 hour  Intake 420 ml  Output 925 ml  Net -505 ml    Filed Weights   02/27/21 0100 02/28/21 0415 03/02/21 0600  Weight: 59.4 kg 61.9 kg 62.1 kg    Examination:  General exam: Looks comfortable on room air.  Frail and debilitated but not in any distress.  Awake and pleasant. Respiratory system: No added sounds.   Cardiovascular system: S1 & S2 heard, RRR. No JVD, murmurs, rubs, gallops or clicks.  Trace pedal edema. Gastrointestinal system: Abdomen is nondistended, soft and nontender. No organomegaly or masses felt. Normal bowel sounds heard. Central nervous system: Patient is alert and oriented. He does have some dysarthria due to previous stroke. His right upper extremity and lower extremity has some spasticity and decreased strength 4/5 as compared to left.    Data Reviewed: I have personally reviewed following labs and imaging studies  CBC: Recent Labs  Lab 02/26/21 1541 02/27/21 0332 03/01/21 0523  WBC 8.6 10.9* 9.2  NEUTROABS 5.7 8.2* 6.1  HGB 10.8* 11.5* 10.7*  HCT 35.2* 35.4* 33.7*  MCV 78.4* 76.1* 76.8*  PLT 308 294 [id number]   Basic Metabolic Panel: Recent Labs  Lab 02/26/21 1541 02/27/21 0332 02/28/21 0227 03/01/21 0523 03/02/21 0414  NA 147* 142 140 136 140  K 3.0* 3.7 3.2* 3.6 3.7  CL 113* 112* 110 109 110  CO2 '28 24 25 23 23  '$ GLUCOSE 105* 124* 98 88 79  BUN '16 16 18 17 19  '$ CREATININE 2.37* 2.19* 2.19* 2.27* 2.21*  CALCIUM 8.0* 7.7* 7.5* 7.7* 7.6*  MG  --  1.9  --  1.8  --   PHOS  --  3.4  --  3.3  --    GFR: Estimated Creatinine Clearance: 22.2 mL/min (A) (by C-G formula based on SCr of 2.21 mg/dL (H)). Liver Function Tests: Recent Labs  Lab 02/26/21 1541 02/27/21 0332  AST 24 23  ALT 16 16  ALKPHOS 64 61  BILITOT 0.5 0.7  PROT 5.0* 4.9*  ALBUMIN 2.3* 2.3*   Recent Labs  Lab 02/26/21 1820  LIPASE 23   No results for input(s): AMMONIA in the last 168 hours. Coagulation Profile: Recent Labs  Lab 02/26/21 1541  INR 1.0   Cardiac Enzymes: No results for input(s): CKTOTAL, CKMB,  CKMBINDEX, TROPONINI in the last 168 hours. BNP (last 3 results) No results for input(s): PROBNP in the last 8760 hours. HbA1C: No results for input(s): HGBA1C in the last 72 hours. CBG: Recent Labs  Lab 03/02/21 0748  GLUCAP 82   Lipid Profile: No results for input(s): CHOL, HDL, LDLCALC, TRIG, CHOLHDL, LDLDIRECT in the last 72 hours. Thyroid Function Tests: No results for input(s): TSH, T4TOTAL, FREET4, T3FREE, THYROIDAB in the last 72 hours.  Anemia Panel: No results for input(s): VITAMINB12, FOLATE, FERRITIN, TIBC, IRON, RETICCTPCT in the last 72 hours. Sepsis Labs: No results for input(s): PROCALCITON, LATICACIDVEN in the last 168 hours.  Recent Results (from the past 240 hour(s))  Urine Culture     Status: Abnormal   Collection Time: 02/26/21  4:02 PM   Specimen: Urine, Clean  Catch  Result Value Ref Range Status   Specimen Description   Final    URINE, CLEAN CATCH Performed at [facility], [place] [address] [place], [place] [address]    Special Requests   Final    NONE Performed at [facility], [place] [address], [place], [place] [address]    Culture (A)  Final    <10,000 COLONIES/mL INSIGNIFICANT GROWTH Performed at [place] [address], [place], [place] [address]    Report Status 02/28/2021 FINAL  Final  SARS CORONAVIRUS 2 (TAT 6-24 HRS) Nasopharyngeal Nasopharyngeal Swab     Status: None   Collection Time: 02/27/21 12:31 AM   Specimen: Nasopharyngeal Swab  Result Value Ref Range Status   SARS Coronavirus 2 NEGATIVE NEGATIVE Final    Comment: (NOTE) SARS-CoV-2 target nucleic acids are NOT DETECTED.  The SARS-CoV-2 RNA is generally detectable in upper and lower respiratory specimens during the acute phase of infection. Negative results do not preclude SARS-CoV-2 infection, do not rule out co-infections with other pathogens, and should not be used as the sole basis for treatment or other patient management  decisions. Negative results must be combined with clinical observations, patient history, and epidemiological information. The expected result is Negative.  Fact Sheet for Patients: SugarRoll.be  Fact Sheet for Healthcare Providers: https://www.woods-mathews.com/  This test is not yet approved or cleared by the [place] FDA and  has been authorized for detection and/or diagnosis of SARS-CoV-2 by FDA under an Emergency Use Authorization (EUA). This EUA will remain  in effect (meaning this test can be used) for the duration of the COVID-19 declaration under Se ction 564(b)(1) of the Act, 21 U.S.C. section 360bbb-3(b)(1), unless the authorization is terminated or revoked sooner.  Performed at [facility], [place] [address] [place], [place] [address]   MRSA Next Gen by PCR, Nasal     Status: None   Collection Time: 02/27/21  1:03 AM   Specimen: Nasal Mucosa; Nasal Swab  Result Value Ref Range Status   MRSA by PCR Next Gen NOT DETECTED NOT DETECTED Final    Comment: (NOTE) The GeneXpert MRSA Assay (FDA approved for NASAL specimens only), is one component of a comprehensive MRSA colonization surveillance program. It is not intended to diagnose MRSA infection nor to guide or monitor treatment for MRSA infections. Test performance is not FDA approved in patients less than 62 years old. Performed at [facility], [place] [address], [place], [place] [address]          Radiology Studies: No results found.      Scheduled Meds:  acyclovir  200 mg Oral Q8H   amLODipine  10 mg Oral Daily   atorvastatin  20 mg Oral Daily   carvedilol  12.5 mg Oral BID   Chlorhexidine Gluconate Cloth  6 each Topical Daily   feeding supplement  237 mL Oral BID BM   heparin  5,000 Units Subcutaneous Q8H   lidocaine  15 mL Mouth/Throat TID AC   mouth rinse  15 mL Mouth Rinse BID   metoCLOPramide  5 mg Oral QID   multivitamin  with minerals  1 tablet Oral Daily   pantoprazole  40 mg Oral BID   polyethylene glycol  17 g Oral Daily   Continuous Infusions:     LOS: 4 days    Time spent: 32 minutes     Barb Merino, MD [facility] Pager [phone]

## 2021-03-03 DIAGNOSIS — K208 Other esophagitis without bleeding: Secondary | ICD-10-CM

## 2021-03-03 DIAGNOSIS — B0089 Other herpesviral infection: Principal | ICD-10-CM

## 2021-03-03 LAB — PHOSPHORUS: Phosphorus: 3.3 mg/dL (ref 2.5–4.6)

## 2021-03-03 LAB — CBC WITH DIFFERENTIAL/PLATELET
Abs Immature Granulocytes: 0.01 10*3/uL (ref 0.00–0.07)
Basophils Absolute: 0.1 10*3/uL (ref 0.0–0.1)
Basophils Relative: 1 %
Eosinophils Absolute: 0.2 10*3/uL (ref 0.0–0.5)
Eosinophils Relative: 3 %
HCT: 30.9 % — ABNORMAL LOW (ref 39.0–52.0)
Hemoglobin: 9.9 g/dL — ABNORMAL LOW (ref 13.0–17.0)
Immature Granulocytes: 0 %
Lymphocytes Relative: 35 %
Lymphs Abs: 2.7 10*3/uL (ref 0.7–4.0)
MCH: 24.6 pg — ABNORMAL LOW (ref 26.0–34.0)
MCHC: 32 g/dL (ref 30.0–36.0)
MCV: 76.7 fL — ABNORMAL LOW (ref 80.0–100.0)
Monocytes Absolute: 0.6 10*3/uL (ref 0.1–1.0)
Monocytes Relative: 8 %
Neutro Abs: 4 10*3/uL (ref 1.7–7.7)
Neutrophils Relative %: 53 %
Platelets: 254 10*3/uL (ref 150–400)
RBC: 4.03 MIL/uL — ABNORMAL LOW (ref 4.22–5.81)
RDW: 24.8 % — ABNORMAL HIGH (ref 11.5–15.5)
WBC: 7.6 10*3/uL (ref 4.0–10.5)
nRBC: 0 % (ref 0.0–0.2)

## 2021-03-03 LAB — COMPREHENSIVE METABOLIC PANEL
ALT: 20 U/L (ref 0–44)
AST: 39 U/L (ref 15–41)
Albumin: 1.8 g/dL — ABNORMAL LOW (ref 3.5–5.0)
Alkaline Phosphatase: 57 U/L (ref 38–126)
Anion gap: 2 — ABNORMAL LOW (ref 5–15)
BUN: 20 mg/dL (ref 8–23)
CO2: 23 mmol/L (ref 22–32)
Calcium: 7.4 mg/dL — ABNORMAL LOW (ref 8.9–10.3)
Chloride: 112 mmol/L — ABNORMAL HIGH (ref 98–111)
Creatinine, Ser: 2.2 mg/dL — ABNORMAL HIGH (ref 0.61–1.24)
GFR, Estimated: 29 mL/min — ABNORMAL LOW (ref >60)
Glucose, Bld: 95 mg/dL (ref 70–99)
Potassium: 3.7 mmol/L (ref 3.5–5.1)
Sodium: 137 mmol/L (ref 135–145)
Total Bilirubin: 0.5 mg/dL (ref 0.3–1.2)
Total Protein: 4.2 g/dL — ABNORMAL LOW (ref 6.5–8.1)

## 2021-03-03 LAB — MAGNESIUM: Magnesium: 1.8 mg/dL (ref 1.7–2.4)

## 2021-03-03 MED ORDER — CARVEDILOL 12.5 MG PO TABS: 12.5000 mg | ORAL_TABLET | Freq: Two times a day (BID) | ORAL | 0 refills | Status: DC

## 2021-03-03 MED ORDER — AMLODIPINE BESYLATE 10 MG PO TABS: 10.0000 mg | ORAL_TABLET | Freq: Every day | ORAL | 0 refills | Status: AC

## 2021-03-03 MED ORDER — ACYCLOVIR 200 MG/5ML PO SUSP: 200.0000 mg | Freq: Three times a day (TID) | ORAL | 0 refills | Status: DC

## 2021-03-03 MED ORDER — LIDOCAINE VISCOUS HCL 2 % MT SOLN: 15.0000 mL | Freq: Three times a day (TID) | OROMUCOSAL | 0 refills | Status: DC

## 2021-03-03 MED ORDER — FERROUS SULFATE 300 (60 FE) MG/5ML PO SYRP: 300.0000 mg | ORAL_SOLUTION | Freq: Every day | ORAL | 11 refills | Status: AC

## 2021-03-03 NOTE — TOC Transition Note (Signed)
Transition of Care [facility]) - CM/SW Discharge Note   Patient Details  Name: Logan [name] MRN: [id number] Date of Birth: 13-Jun-1935  Transition of Care [facility] CM/SW Contact:  Dessa Phi, RN Phone Number: 03/03/2021, 1:47 PM   Clinical Narrative: Damaris Schooner to son Manish on phone about d/c plans-Manish [phone]-agree to resumption of HHC-HHPT/OT adding aide-[facility] rep amy aware of d/c today;ordered rw;3n1-can ship to home-[facility] rep Zach aware of d/c confirmed address. Patient already has an old rw @ home older than 5years.Will need PTAR. Family will be home to receive patient-PTAR scheduled for 2:30p pick up. No further CM needs.      Final next level of care: Home w Home Health Services Barriers to Discharge: No Barriers Identified   Patient Goals and CMS Choice   CMS Medicare.gov Compare Post Acute Care list provided to:: Patient Represenative (must comment) Choice offered to / list presented to : Adult Children  Discharge Placement                       Discharge Plan and Services   Discharge Planning Services: CM Consult Post Acute Care Choice: Durable Medical Equipment, Home Health          DME Arranged: 3-N-1, Walker rolling DME Agency: [employer] Date DME Agency Contacted: 03/03/21 Time DME Agency Contacted: [phone] Representative spoke with at DME Agency: Thedore Mins HH Arranged: PT, OT, Nurse's Aide HH Agency: [facility] Date [place]: 03/03/21 Time [place]: [id number] Representative spoke with at [place]: Amy  Social Determinants of Health ([place]) Interventions     Readmission Risk Interventions No flowsheet data found.

## 2021-03-03 NOTE — Progress Notes (Addendum)
Logan [name] 86 y.o. 04-17-1935   CC:  Poor PO intake, HSV esophagitis    Subjective: Patient denies complaints. Denies abdominal pain, dysphagia, odynophagia, nausea, or vomiting.  Is tolerating a vegetarian diet.  Reports brown stools.  Per RN, patient is eating at least 30-50% of meals, as well as multiple Ensure shakes daily.  Encounter conducted with [employer] video interpreting services Astrid Divine [phone])   ROS : Review of Systems  Cardiovascular:  Negative for chest pain and palpitations.  Gastrointestinal:  Negative for abdominal pain, blood in stool, constipation, diarrhea, heartburn, melena, nausea and vomiting.     Objective: Vital signs in last 24 hours:     Vitals:    03/02/21 2003 03/03/21 0632  BP: (!) 142/75 (!) 166/93  Pulse: 87 83  Resp:   18  Temp: 98.1 F (36.7 C) 98 F (36.7 C)  SpO2: 96% 100%      Physical Exam:   General:  Alert, cooperative, no distress  Head:  Normocephalic, without obvious abnormality, atraumatic  Eyes:  Anicteric sclera, EOM's intact  Lungs:   Clear to auscultation bilaterally, respirations unlabored  Heart:  Regular rate and rhythm, S1, S2 normal  Abdomen:   Soft, non-tender, non-distended, bowel sounds active all four quadrants      Lab Results: Recent Labs (last 2 labs)        Recent Labs    03/01/21 0523 03/02/21 0414 03/03/21 0425  NA 136 140 137  K 3.6 3.7 3.7  CL 109 110 112*  CO2 '23 23 23  '$ GLUCOSE 88 79 95  BUN '17 19 20  '$ CREATININE 2.27* 2.21* 2.20*  CALCIUM 7.7* 7.6* 7.4*  MG 1.8  --  1.8  PHOS 3.3  --  3.3      Recent Labs (last 2 labs)      Recent Labs    03/03/21 0425  AST 39  ALT 20  ALKPHOS 57  BILITOT 0.5  PROT 4.2*  ALBUMIN 1.8*      Recent Labs (last 2 labs)       Recent Labs    03/01/21 0523 03/03/21 0425  WBC 9.2 7.6  NEUTROABS 6.1 4.0  HGB 10.7* 9.9*  HCT 33.7* 30.9*  MCV 76.8* 76.7*  PLT 317 254        Assessment: Poor PO intake, malnutrition, HSV esophagitis per  endoscopic biopsy, on acyclovir -Now tolerating regular diet - Albumin 1.8  Plan: Continue acyclovir for a total of 14 days.  Given improvement in PO intake, I do not think patient requires feeding tube at this time.  Continue to monitor clinically.  [facility] will sign off. Please contact us if we can be of any further assistance during this hospital stay.   Salley Slaughter  PA-C [facility] Contact #  [phone]

## 2021-03-03 NOTE — Progress Notes (Signed)
Pt son at bedside and notified RN that pt family will be transporting pt home instead of PTAR. MD notified. PTAR was cancelled. According to CM, assistive devices for home will be shipped to pt home. MD notified about insurance not covering iron suspension and MD advised son to buy OTC iron gummies since they could not afford monthly. Information passed to family regarding this. The pharmacy will not have one of the medications, Acyclovir, until tomorrow afternoon. MD advised for pt to wait until 1800 so pharmacy could give the dose early, and RN could administer it prior to d/c. When RN came to speak to pt and family, pt was already downstairs and the son was still on the unit. The son stated he could not wait until 1800 for the medication and left with the patient. MD notified of this.

## 2021-03-03 NOTE — Progress Notes (Signed)
Occupational Therapy Treatment Patient Details Name: Logan [name] MRN: [id number] DOB: 04-10-1935 Today's Date: 03/03/2021    History of present illness 86 year old man PMH gastric ulcer, GI bleed, chronic iron deficiency anemia , CVA,presenting with epigastric abdominal pain, nausea, vomiting, FTT, on 8/16. Admitted for CHF, acute on Chronic Kidney injury. EGD revealed  reflux esophagitis with no bleeding   OT comments  Patient was noted to be unsafe with transition to recliner on this date with multiple attempts for sit to stand to reduce to min A then patient was min A  for first half of turn then patient attempted to sit down too early with inability to follow cues to back up to chair further with TD to make contact with recliner. Nursing staff made aware. Patient is eager to transition home. If patient to transition home at this time will need 24/7 care and [facility] services. Interpreter was used during session to communicate.   Follow Up Recommendations  Home health OT;Supervision/Assistance - 24 hour    Equipment Recommendations  3 in 1 bedside commode    Recommendations for Other Services      Precautions / Restrictions Precautions Precautions: Fall Precaution Comments: use interpreter-Gujarati Restrictions Weight Bearing Restrictions: No       Mobility Bed Mobility Overal bed mobility: Needs Assistance Bed Mobility: Supine to Sit   Sidelying to sit: Min assist            Transfers Overall transfer level: Needs assistance Equipment used: Rolling walker (2 wheeled) Transfers: Sit to/from [facility] Sit to Stand: Min assist Stand pivot transfers: Total assist       General transfer comment: patient is unsteady with max verbal cues needed for proper hand placement and safety with transfers. patient attempted to sit too early with TD to make contact with chair.    Balance                                           ADL either  performed or assessed with clinical judgement   ADL Overall ADL's : Needs assistance/impaired                           Toilet Transfer Details (indicate cue type and reason): deferred on this date.           General ADL Comments: patient was able to complete grooming tasks sitting in bed on this date. patient kept asking to transition home today. nurse made aware.patient was min A to complete sit to stand from edge of bed with two attempts made. patient attempted to sit prior to reaching the recliner with rolling walker with TD to make contact with chair and prevent fall.patient was educated on importance of backing up to recliner prior to attempting to sit down.  nurse tech made aware patient should be two person for safety.     Vision       Perception     Praxis      Cognition Arousal/Alertness: Awake/alert Behavior During Therapy: WFL for tasks assessed/performed Overall Cognitive Status: Within Functional Limits for tasks assessed Area of Impairment: Safety/judgement                         Safety/Judgement: Decreased awareness of deficits  Exercises     Shoulder Instructions       General Comments      Pertinent Vitals/ Pain       Pain Assessment: Faces Faces Pain Scale: No hurt  Home Living                                          Prior Functioning/Environment              Frequency  Min 2X/week        Progress Toward Goals  OT Goals(current goals can now be found in the care plan section)  Progress towards OT goals: Progressing toward goals     Plan      Co-evaluation                 AM-PAC OT "6 Clicks" Daily Activity     Outcome Measure   Help from another person eating meals?: A Little Help from another person taking care of personal grooming?: A Little Help from another person toileting, which includes using toliet, bedpan, or urinal?: A Lot Help from another person  bathing (including washing, rinsing, drying)?: A Lot Help from another person to put on and taking off regular upper body clothing?: A Lot Help from another person to put on and taking off regular lower body clothing?: A Lot 6 Click Score: 14    End of Session Equipment Utilized During Treatment: Rolling walker;Gait belt  OT Visit Diagnosis: Other abnormalities of gait and mobility (R26.89);Pain;Muscle weakness (generalized) (M62.81);Hemiplegia and hemiparesis Hemiplegia - Right/Left: Right Hemiplegia - dominant/non-dominant: Dominant   Activity Tolerance Patient tolerated treatment well   Patient Left in chair;with call bell/phone within reach (nurse tech in room)   Nurse Communication Mobility status        Time: [phone] (OT in room with MD coming in to speak with patient during session) OT Time Calculation (min): 23 min  Charges: OT Treatments $Self Care/Home Management : 8-22 mins  Jackelyn Poling OTR/L, MS Acute Rehabilitation Department Office# [phone] Pager# [phone] 03/03/2021, 9:36 AM

## 2021-03-03 NOTE — Care Management Important Message (Signed)
Important Message  Patient Details IM Letter placed in Patient's room. Name: Logan [name] MRN: [id number] Date of Birth: [birth year]   Medicare Important Message Given:  Yes     Kerin Salen 03/03/2021, 1:11 PM

## 2021-03-03 NOTE — Progress Notes (Signed)
Physical Therapy Treatment Patient Details Name: Logan [name] MRN: [id number] DOB: 07-May-1935 Today's Date: 03/03/2021    History of Present Illness 86 year old man PMH gastric ulcer, GI bleed, chronic iron deficiency anemia , CVA,presenting with epigastric abdominal pain, nausea, vomiting, FTT, on 8/16. Admitted for CHF, acute on Chronic Kidney injury. EGD revealed  reflux esophagitis with no bleeding    PT Comments    Assisted OOB to amb to bathroom, pt required increased assist this session.  General bed mobility comments: assist for trunk and increased time to complete. 50% VC's to "uncross legs" and achieve upright/midline seated posture.  General transfer comment: pt impulsive and unstaedy.  Required increased assist from lower toilet level.  Required increased assist during static standing to wash hands at sink.  Mod lean to RIGHT.  Poor self correction.  HIGH FALL RISK. General Gait Details: VERY unsteady gait with pronounced decreased WBing R LE "stroke side" and poor forward posture.  Limited distance by fatigue.  Deconditioned.  Pt impulsive and wanted to keep going. HIGH FALL RISK. Used I Press photographer during session for pt and spouse.  Explained, pt will need assist 24/7.    Follow Up Recommendations  Home health PT;Supervision/Assistance - 24 hour     Equipment Recommendations  None recommended by PT    Recommendations for Other Services       Precautions / Restrictions Precautions Precautions: Fall Precaution Comments: impulsive, s/p CVA R Hemi, Non English speaking, use interpreter Gujarati Restrictions Weight Bearing Restrictions: No    Mobility  Bed Mobility Overal bed mobility: Needs Assistance Bed Mobility: Supine to Sit   Sidelying to sit: Min assist Supine to sit: Min guard     General bed mobility comments: assist for trunk and increased time to complete. 50% VC's to "uncross legs" and achieve upright/midline seated posture.    Transfers Overall  transfer level: Needs assistance Equipment used: Rolling walker (2 wheeled) Transfers: Sit to/from [facility] Sit to Stand: Min assist;Mod assist Stand pivot transfers: Mod assist;Max assist       General transfer comment: pt impulsive and unstaedy.  Required increased assist from lower toilet level.  Required increased assist during static standing to wash hands at sink.  Mod lean to RIGHT.  Poor self correction.  HIGH FALL RISK.  Ambulation/Gait Ambulation/Gait assistance: Mod assist Gait Distance (Feet): 18 Feet Assistive device: Rolling walker (2 wheeled)   Gait velocity: decr   General Gait Details: VERY unsteady gait with pronounced decreased WBing R LE "stroke side" and poor forward posture.  Limited distance by fatigue.  Deconditioned.  Pt impulsive and wanted to keep going. HIGH FALL RISK.   Stairs             Wheelchair Mobility    Modified Rankin (Stroke Patients Only)       Balance                                            Cognition Arousal/Alertness: Awake/alert Behavior During Therapy: WFL for tasks assessed/performed Overall Cognitive Status: Within Functional Limits for tasks assessed Area of Impairment: Safety/judgement                         Safety/Judgement: Decreased awareness of deficits     General Comments: AxO x 2 used I Press photographer.  Following all commands and able to  express needs but is impulsive      Exercises      General Comments        Pertinent Vitals/Pain Pain Assessment: No/denies pain Faces Pain Scale: No hurt    Home Living                      Prior Function            PT Goals (current goals can now be found in the care plan section) Progress towards PT goals: Progressing toward goals    Frequency    Min 3X/week      PT Plan Current plan remains appropriate    Co-evaluation              AM-PAC PT "6 Clicks" Mobility   Outcome  Measure  Help needed turning from your back to your side while in a flat bed without using bedrails?: A Little Help needed moving from lying on your back to sitting on the side of a flat bed without using bedrails?: A Little Help needed moving to and from a bed to a chair (including a wheelchair)?: A Little Help needed standing up from a chair using your arms (e.g., wheelchair or bedside chair)?: A Lot Help needed to walk in hospital room?: A Lot Help needed climbing 3-5 steps with a railing? : Total 6 Click Score: 14    End of Session Equipment Utilized During Treatment: Gait belt Activity Tolerance: Patient limited by fatigue Patient left: in bed Nurse Communication: Mobility status PT Visit Diagnosis: Unsteadiness on feet (R26.81);Muscle weakness (generalized) (M62.81);Difficulty in walking, not elsewhere classified (R26.2)     Time: [id number] PT Time Calculation (min) (ACUTE ONLY): 25 min  Charges:  $Gait Training: 8-22 mins $Therapeutic Activity: 8-22 mins                     {Ameera Tigue  PTA Acute  Rehabilitation Services Pager      [phone] Office      [phone]

## 2021-03-03 NOTE — Discharge Summary (Signed)
Physician Discharge Summary  Logan [name] [id number] DOB: 08-31-1934 DOA: 02/26/2021  PCP: Jilda Panda, MD  Admit date: 02/26/2021 Discharge date: 03/03/2021  Admitted From: Home Disposition: Home with home health  Recommendations for Outpatient Follow-up:  Follow up with PCP in 1-2 weeks Please obtain BMP/CBC in one week   Home Health: PT/OT Equipment/Devices: Present at home  Discharge Condition: Stable CODE STATUS: Full code Diet recommendation: Low-salt diet.  Nutritional supplements.  Soft and easily digestible food.  Discharge summary: 86 year old gentleman with history of stroke with right hemiparesis, chronic anemia of iron deficiency, history of esophageal ulcer, hiatal hernia, GERD, hyperlipidemia and previous GI bleeding brought from home with failure to thrive, not eating well and not walking well as well constipation for last 10 days.  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  Unable to take much of the medication by mouth.  Home health nurse reported weight loss so sent to the hospital.     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  Admitted to the hospital and treated with IV acyclovir and lidocaine viscous and changed to acyclovir syrup which he is tolerating.  Oral intake improved without pain or discomfort. Changed to acyclovir syrup, total 14 days of therapy. He can continue to use lidocaine viscous to help with pain relief and spasm relief. Currently with adequate oral intake.  Patient is vegetarian and does not eat very well.  Discussed about taking nutritional supplements and high-protein and calorie diet.  Patient to take more protein supplements, liquid and soft consistencies at home.   Hypertensive urgency:  Presented with blood pressure of 240s/100.  Only on carvedilol at home.  Increased dose of carvedilol to 12.5 mg twice a day, already on amlodipine 10 mg daily.  Blood pressures are acceptable. Check blood pressures at home and may need to go up on medications.  Anticipate, his blood pressures will stabilize once he is at home.   Iron deficiency anemia: Recently diagnosed.  Hemoglobin is more than 10 and adequate. Given a dose of IV iron in the hospital.  Hemoglobin remains a stable.  He will benefit with ongoing iron supplements.  Will prescribe iron in liquid form for ease of administration.  CKD stage IV: At about his baseline.  Creatinine 2.2 today.   Right-sided hemiparesis and spasticity: Patient has history of a stroke resulting in right-sided hemiparesis and spasticity.  Chronic.  No new changes. CT scan of the head with chronic bilateral basal ganglia stroke.  Continue to work with PT OT.  Will benefit with home health PT OT.   Patient is adequately stabilized.  He was admitted because of unable to eat that has improved now.  He has chronic issues including difficulty with balancing, spasticity and weakness on the right leg causing ambulatory dysfunction.  Seen by PT OT.  Patient will go home and work with PT OT.  He will need 24/7 supervision provided by his family.  Discharge Diagnoses:  Active Problems:   Failure to thrive in adult   Herpes simplex esophagitis    Discharge Instructions  Discharge Instructions     Call MD for:  persistant nausea and vomiting   Complete by: As directed    Call MD for:  severe uncontrolled pain   Complete by: As directed    Diet - low sodium  heart healthy   Complete by: As directed    Discharge instructions   Complete by: As directed    Use nutritional supplement like boost 2-3 times a day.  Eat soft and easily digestible food, eat more protein and calories.   Increase activity slowly   Complete by: As directed       Allergies as of  03/03/2021   No Known Allergies      Medication List     STOP taking these medications    acyclovir 200 MG capsule Commonly known as: ZOVIRAX Replaced by: acyclovir 200 MG/5ML suspension       TAKE these medications    acetaminophen 500 MG tablet Commonly known as: TYLENOL Take 1,000 mg by mouth every 6 (six) hours as needed for mild pain.   acyclovir 200 MG/5ML suspension Commonly known as: ZOVIRAX Take 5 mLs (200 mg total) by mouth every 8 (eight) hours for 10 days. Replaces: acyclovir 200 MG capsule   amLODipine 10 MG tablet Commonly known as: NORVASC Take 1 tablet (10 mg total) by mouth daily. Start taking on: March 04, 2021   atorvastatin 20 MG tablet Commonly known as: LIPITOR Take 20 mg by mouth daily.   carvedilol 12.5 MG tablet Commonly known as: COREG Take 1 tablet (12.5 mg total) by mouth 2 (two) times daily. What changed:  medication strength how much to take   feeding supplement Liqd Take 237 mLs by mouth 2 (two) times daily between meals.   ferrous sulfate 300 (60 Fe) MG/5ML syrup Take 5 mLs (300 mg total) by mouth daily.   lactulose 10 GM/15ML solution Commonly known as: CHRONULAC Take 10 g by mouth daily as needed for moderate constipation.   lidocaine 2 % solution Commonly known as: XYLOCAINE Use as directed 15 mLs in the mouth or throat 3 (three) times daily before meals for 10 days.   pantoprazole 40 MG tablet Commonly known as: PROTONIX Take 1 tablet (40 mg total) 2 (two) times daily by mouth. What changed: when to take this        Follow-up Information     Jilda Panda, MD Follow up in 2 week(s).   Specialty: Internal Medicine Contact information: [address] [place] [address] [phone]                No Known Allergies  Consultations: Gastroenterology   Procedures/Studies: CT ABDOMEN PELVIS WO CONTRAST  Result Date: 02/26/2021 CLINICAL DATA:  Weakness, weight loss and poor oral intake. EXAM:  CT ABDOMEN AND PELVIS WITHOUT CONTRAST TECHNIQUE: Multidetector CT imaging of the abdomen and pelvis was performed following the standard protocol without IV contrast. COMPARISON:  February 10, 2021 FINDINGS: Lower chest: Mild to moderate severity areas of atelectasis and/or infiltrate are seen within the bilateral lung bases. Small bilateral pleural effusions are seen. A small pericardial effusion is present. Hepatobiliary: No focal liver abnormality is seen. No gallstones, gallbladder wall thickening, or biliary dilatation. Pancreas: Unremarkable. No pancreatic ductal dilatation or surrounding inflammatory changes. Spleen: Normal in size without focal abnormality. Adrenals/Urinary Tract: Adrenal glands are unremarkable. Kidneys are normal, without renal calculi, focal lesion, or hydronephrosis. Bladder is unremarkable. Stomach/Bowel: Stomach is within normal limits. Appendix appears normal. No evidence of bowel wall thickening, distention, or inflammatory changes. Vascular/Lymphatic: Aortic atherosclerosis. No enlarged abdominal or pelvic lymph nodes. Reproductive: The prostate gland is mildly enlarged. Other: No abdominal wall hernia or abnormality. No abdominopelvic ascites. Musculoskeletal: A total right hip replacement is seen with associated streak artifact and  subsequently limited evaluation of the adjacent osseous and soft tissue structures. Degenerative changes seen throughout the lumbar spine. IMPRESSION: 1. Mild to moderate severity bibasilar atelectasis and/or infiltrate with small bilateral pleural effusions. 2. Small pericardial effusion. 3. Mildly enlarged prostate gland. 4. Total right hip replacement. 5. Aortic atherosclerosis. Aortic Atherosclerosis (ICD10-I70.0). Electronically Signed   By: Virgina Norfolk M.D.   On: 02/26/2021 18:35   DG Chest 1 View  Result Date: 02/11/2021 CLINICAL DATA:  CHF EXAM: CHEST  1 VIEW COMPARISON:  Chest radiograph 04/10/2017 FINDINGS: The patient is mildly  rotated to the right. The heart is mildly enlarged. Mediastinal contours are otherwise within normal limits, allowing for patient rotation. There is calcified atherosclerotic plaque of the aortic arch. There are small bilateral pleural effusions. Hazy right basilar and retrocardiac opacities likely reflects subsegmental atelectasis. The upper lungs are well aerated. There is no overt pulmonary edema. There is no pneumothorax. The bones are unremarkable. IMPRESSION: Mild cardiomegaly with small bilateral pleural effusions. Adjacent bibasilar opacities most likely reflect subsegmental atelectasis. Electronically Signed   By: Valetta Mole M.D.   On: 02/11/2021 08:12   CT HEAD WO CONTRAST (5MM)  Result Date: 02/26/2021 CLINICAL DATA:  Weakness, poor oral intake and 10 pound weight loss in 2 days. EXAM: CT HEAD WITHOUT CONTRAST TECHNIQUE: Contiguous axial images were obtained from the base of the skull through the vertex without intravenous contrast. COMPARISON:  None. FINDINGS: Brain: There is mild to moderate severity cerebral atrophy with widening of the extra-axial spaces and ventricular dilatation. There are areas of decreased attenuation within the white matter tracts of the supratentorial brain, consistent with microvascular disease changes. Chronic bilateral basal ganglia lacunar infarcts are noted. Vascular: No hyperdense vessel or unexpected calcification. Skull: Normal. Negative for fracture or focal lesion. Sinuses/Orbits: No acute finding. 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 IMPRESSION: 1. Generalized cerebral atrophy. 2. Chronic bilateral basal ganglia lacunar infarcts. 3. No acute intracranial abnormality. Electronically Signed   By: Virgina Norfolk M.D.   On: 02/26/2021 18:37   NM GI Blood Loss  Result Date: 02/13/2021 CLINICAL DATA:  Evaluate active GI bleeding. EXAM: NUCLEAR MEDICINE GASTROINTESTINAL BLEEDING SCAN TECHNIQUE: Sequential abdominal images were obtained  following intravenous administration of Tc-71mlabeled red blood cells. RADIOPHARMACEUTICALS:  23.4 mCi Tc-990mertechnetate in-vitro labeled red cells. COMPARISON:  02/10/2021 FINDINGS: No abnormal extravasation of the radiopharmaceutical to suggest active GI bleed. Normal physiologic tracer distribution is noted throughout the blood pool. IMPRESSION: No signs of active GI bleed. Electronically Signed   By: TaKerby Moors.D.   On: 02/13/2021 13:56   CT ABDOMEN PELVIS W CONTRAST  Result Date: 02/10/2021 CLINICAL DATA:  Right lower quadrant pain EXAM: CT ABDOMEN AND PELVIS WITH CONTRAST TECHNIQUE: Multidetector CT imaging of the abdomen and pelvis was performed using the standard protocol following bolus administration of intravenous contrast. CONTRAST:  6532mMNIPAQUE IOHEXOL 350 MG/ML SOLN COMPARISON:  CT abdomen/pelvis 11/26/2016 FINDINGS: Lower chest: There are bilateral pleural effusions with adjacent subsegmental atelectasis. The imaged heart is unremarkable. There is no pericardial effusion. Hepatobiliary: Tiny hypodense lesions in the liver are too small to characterize, but likely reflects cysts. There are no suspicious focal lesions. The gallbladder is normal. There is no intra or extrahepatic biliary ductal dilatation. Pancreas: There are no focal lesions or contour abnormalities. There is no main pancreatic ductal dilatation or peripancreatic inflammatory change. Spleen: Unremarkable. Adrenals/Urinary Tract: The adrenals are unremarkable. There is cortical thinning of the left  kidney likely reflecting scarring. Hypodense lesions in the left kidney are too small to characterize but likely reflects cysts, not significantly changed. There are no other focal lesions. There are no calculi. There is no hydronephrosis or hydroureter. The bladder is not well assessed due to decompression and streak artifact from right hip arthroplasty hardware. Stomach/Bowel: The stomach is unremarkable. There is no  evidence of bowel obstruction. The appendix is unremarkable. There is no abnormal bowel wall thickening or inflammatory change. There are scattered colonic diverticuli without evidence of acute diverticulosis. Vascular/Lymphatic: There is calcified atherosclerotic plaque throughout the nonaneurysmal abdominal aorta and at the origins of the major branch vessels. The SMA, celiac axis, and IMA are patent. The main portal and splenic veins are patent. There is no abdominal or pelvic lymphadenopathy. Reproductive: The prostate and seminal vesicles are grossly unremarkable but suboptimally assessed due to streak artifact. Other: There is scattered free fluid in the abdomen and pelvis. Fluid is also seen in the left inguinal canal. There is no free intraperitoneal air. There is mild diffuse body wall edema. Musculoskeletal: The patient is status post right hip arthroplasty. There is mild anterior wedge deformity of the L1 vertebral body, unchanged since 2018. There is multilevel degenerative change throughout the remainder of the lumbar spine. IMPRESSION: 1. Bilateral pleural effusions, mild diffuse body wall edema, and small volume ascites in the abdomen pelvis raises the possibility of fluid overload. 2. Otherwise, no acute findings in the abdomen or pelvis. Specifically, no evidence of appendicitis. 3. Scattered colonic diverticula. 4.  Aortic Atherosclerosis (ICD10-I70.0). Electronically Signed   By: Valetta Mole M.D.   On: 02/10/2021 11:56   NM Hepato W/EF  Result Date: 02/16/2021 CLINICAL DATA:  Right upper quadrant abdominal pain, nondiagnostic abdominal ultrasound EXAM: NUCLEAR MEDICINE HEPATOBILIARY IMAGING WITH GALLBLADDER EF TECHNIQUE: Sequential images of the abdomen were obtained out to 60 minutes following intravenous administration of radiopharmaceutical. After oral ingestion of Ensure, gallbladder ejection fraction was determined. At 60 min, normal ejection fraction is greater than 33%.  RADIOPHARMACEUTICALS:  5.4 mCi Tc-30m Choletec IV COMPARISON:  None. FINDINGS: Prompt uptake and biliary excretion of activity by the liver is seen. Gallbladder activity is visualized, consistent with patency of cystic duct. Biliary activity passes into small bowel, consistent with patent common bile duct. Calculated gallbladder ejection fraction is 97%. (Normal gallbladder ejection fraction with Ensure is greater than 33%.) IMPRESSION: No evidence of acute or chronic cholecystitis. Electronically Signed   By: PValetta MoleM.D.   On: 02/16/2021 15:17   [place]Abdomen Limited  Result Date: 02/10/2021 CLINICAL DATA:  Right upper quadrant abdominal pain. EXAM: ULTRASOUND ABDOMEN LIMITED RIGHT UPPER QUADRANT COMPARISON:  CT abdomen pelvis 05/06/2017, CT abdomen pelvis 02/10/2021 FINDINGS: Gallbladder: Markedly limited evaluation due to overlying bowel gas. No definite gallstones or wall thickening visualized. No sonographic Murphy sign noted by sonographer. Common bile duct: Diameter: 6 mm. Liver: No focal lesion identified. Within normal limits in parenchymal echogenicity. Portal vein is patent on color Doppler imaging with normal direction of blood flow towards the liver. Other: Trace simple free fluid within right upper quadrant. IMPRESSION: 1. Markedly limited/nondiagnostic evaluation of the gallbladder due to overlying bowel gas. 2. Trace simple free fluid within the upper abdomen. Electronically Signed   By: MIven FinnM.D.   On: 02/10/2021 16:35   VAS [place]OWER EXTREMITY VENOUS (DVT) (ONLY [facility] & [facility])  Result Date: 02/27/2021  Lower Venous DVT Study [facility] Date of Exam:  02/26/2021 Medical Rec #: [id number]      Accession #:    [id number] Date of Birth: 1934-06-30     Patient Gender: M Patient Age:   58 years Exam Location:  [facility] Procedure:      VAS [place] LOWER EXTREMITY VENOUS (DVT) Referring Phys: Gareth Morgan  --------------------------------------------------------------------------------  Indications: Swelling.  Risk Factors: Cancer. Limitations: Poor ultrasound/tissue interface. Comparison Study: No prior studies. Performing Technologist: Oliver Hum RVT  Examination Guidelines: A complete evaluation includes B-mode imaging, spectral Doppler, color Doppler, and power Doppler as needed of all accessible portions of each vessel. Bilateral testing is considered an integral part of a complete examination. Limited examinations for reoccurring indications may be performed as noted. The reflux portion of the exam is performed with the patient in reverse Trendelenburg.  +---------+---------------+---------+-----------+----------+--------------+ RIGHT    CompressibilityPhasicitySpontaneityPropertiesThrombus Aging +---------+---------------+---------+-----------+----------+--------------+ CFV      Full           Yes      Yes                                 +---------+---------------+---------+-----------+----------+--------------+ SFJ      Full                                                        +---------+---------------+---------+-----------+----------+--------------+ FV Prox  Full                                                        +---------+---------------+---------+-----------+----------+--------------+ FV Mid   Full                                                        +---------+---------------+---------+-----------+----------+--------------+ FV DistalFull                                                        +---------+---------------+---------+-----------+----------+--------------+ PFV      Full                                                        +---------+---------------+---------+-----------+----------+--------------+ POP      Full           Yes      Yes                                  +---------+---------------+---------+-----------+----------+--------------+ PTV      Full                                                        +---------+---------------+---------+-----------+----------+--------------+  PERO     Full                                                        +---------+---------------+---------+-----------+----------+--------------+   +----+---------------+---------+-----------+----------+--------------+ LEFTCompressibilityPhasicitySpontaneityPropertiesThrombus Aging +----+---------------+---------+-----------+----------+--------------+ CFV Full           Yes      Yes                                 +----+---------------+---------+-----------+----------+--------------+     Summary: RIGHT: - There is no evidence of deep vein thrombosis in the lower extremity.  - No cystic structure found in the popliteal fossa.  LEFT: - No evidence of common femoral vein obstruction.  *See table(s) above for measurements and observations. Electronically signed by Jamelle Haring on 02/27/2021 at 7:46:50 AM.    Final    UE Venous Duplex ([facility] and [facility] ONLY)  Result Date: 02/27/2021 UPPER VENOUS STUDY  Patient [facility]:  [facility]  Date of Exam:   02/26/2021 Medical Rec #: [id number]      Accession #:    [id number] Date of Birth: 03/25/1935     Patient Gender: M Patient Age:   12 years Exam Location:  [facility] Procedure:      VAS [place] UPPER EXTREMITY VENOUS DUPLEX Referring Phys: Gareth Morgan --------------------------------------------------------------------------------  Indications: Swelling Risk Factors: Cancer. Comparison Study: No prior studies. Performing Technologist: Oliver Hum RVT  Examination Guidelines: A complete evaluation includes B-mode imaging, spectral Doppler, color Doppler, and power Doppler as needed of all accessible portions of each vessel. Bilateral testing is considered an integral part of a complete examination. Limited examinations for  reoccurring indications may be performed as noted.  Right Findings: +----------+------------+---------+-----------+----------+-------+ RIGHT     CompressiblePhasicitySpontaneousPropertiesSummary +----------+------------+---------+-----------+----------+-------+ IJV           Full       Yes       Yes                      +----------+------------+---------+-----------+----------+-------+ Subclavian    Full       Yes       Yes                      +----------+------------+---------+-----------+----------+-------+ Axillary      Full       Yes       Yes                      +----------+------------+---------+-----------+----------+-------+ Brachial      Full       Yes       Yes                      +----------+------------+---------+-----------+----------+-------+ Radial        Full                                          +----------+------------+---------+-----------+----------+-------+ Ulnar         Full                                          +----------+------------+---------+-----------+----------+-------+  Cephalic      Full                                          +----------+------------+---------+-----------+----------+-------+ Basilic       Full                                          +----------+------------+---------+-----------+----------+-------+  Left Findings: +----------+------------+---------+-----------+----------+-------+ LEFT      CompressiblePhasicitySpontaneousPropertiesSummary +----------+------------+---------+-----------+----------+-------+ Subclavian    Full       Yes       Yes                      +----------+------------+---------+-----------+----------+-------+  Summary:  Right: No evidence of deep vein thrombosis in the upper extremity. No evidence of superficial vein thrombosis in the upper extremity.  Left: No evidence of thrombosis in the subclavian.  *See table(s) above for measurements and observations.   Diagnosing physician: Jamelle Haring Electronically signed by Jamelle Haring on 02/27/2021 at 7:46:37 AM.    Final    (Echo, Carotid, EGD, Colonoscopy, ERCP)    Subjective: Patient seen and examined.  Very pleasant.  Eager to go home.  He tells me that he has absolutely no problem eating or swallowing.  He is ready to go home.   Discharge Exam: Vitals:   03/02/21 2003 03/03/21 0632  BP: (!) 142/75 (!) 166/93  Pulse: 87 83  Resp:  18  Temp: 98.1 F (36.7 C) 98 F (36.7 C)  SpO2: 96% 100%   Vitals:   03/02/21 1400 03/02/21 2003 03/03/21 0600 03/03/21 0632  BP: 118/87 (!) 142/75  (!) 166/93  Pulse: 78 87  83  Resp:    18  Temp: 97.8 F (36.6 C) 98.1 F (36.7 C)  98 F (36.7 C)  TempSrc: Oral Oral  Oral  SpO2: 96% 96%  100%  Weight:   64 kg   Height:        General: Pt is alert, awake, not in acute distress Cardiovascular: RRR, S1/S2 +, no rubs, no gallops Respiratory: CTA bilaterally, no wheezing, no rhonchi Abdominal: Soft, NT, ND, bowel sounds + Extremities: no edema, no cyanosis Patient has some dysarthria because of previous stroke, at times he is very clear communicating. Right upper extremity is mildly weaker than left. Right lower extremity with some spasticity and internal rotation.    The results of significant diagnostics from this hospitalization (including imaging, microbiology, ancillary and laboratory) are listed below for reference.     Microbiology: Recent Results (from the past 240 hour(s))  Urine Culture     Status: Abnormal   Collection Time: 02/26/21  4:02 PM   Specimen: Urine, Clean Catch  Result Value Ref Range Status   Specimen Description   Final    URINE, CLEAN CATCH Performed at [facility], [place] [address], [place], [place] [address]    Special Requests   Final    NONE Performed at [facility], [place] [address] [place], [place] [address]    Culture (A)  Final    <10,000 COLONIES/mL INSIGNIFICANT  GROWTH Performed at [place] [address], [place], [place] [address]    Report Status 02/28/2021 FINAL  Final  SARS CORONAVIRUS 2 (TAT 6-24 HRS) Nasopharyngeal Nasopharyngeal  Swab     Status: None   Collection Time: 02/27/21 12:31 AM   Specimen: Nasopharyngeal Swab  Result Value Ref Range Status   SARS Coronavirus 2 NEGATIVE NEGATIVE Final    Comment: (NOTE) SARS-CoV-2 target nucleic acids are NOT DETECTED.  The SARS-CoV-2 RNA is generally detectable in upper and lower respiratory specimens during the acute phase of infection. Negative results do not preclude SARS-CoV-2 infection, do not rule out co-infections with other pathogens, and should not be used as the sole basis for treatment or other patient management decisions. Negative results must be combined with clinical observations, patient history, and epidemiological information. The expected result is Negative.  Fact Sheet for Patients: SugarRoll.be  Fact Sheet for Healthcare Providers: https://www.woods-mathews.com/  This test is not yet approved or cleared by the [place] FDA and  has been authorized for detection and/or diagnosis of SARS-CoV-2 by FDA under an Emergency Use Authorization (EUA). This EUA will remain  in effect (meaning this test can be used) for the duration of the COVID-19 declaration under Se ction 564(b)(1) of the Act, 21 U.S.C. section 360bbb-3(b)(1), unless the authorization is terminated or revoked sooner.  Performed at [facility], [place] [address], [place], [place] [address]   MRSA Next Gen by PCR, Nasal     Status: None   Collection Time: 02/27/21  1:03 AM   Specimen: Nasal Mucosa; Nasal Swab  Result Value Ref Range Status   MRSA by PCR Next Gen NOT DETECTED NOT DETECTED Final    Comment: (NOTE) The GeneXpert MRSA Assay (FDA approved for NASAL specimens only), is one component of a comprehensive MRSA colonization  surveillance program. It is not intended to diagnose MRSA infection nor to guide or monitor treatment for MRSA infections. Test performance is not FDA approved in patients less than 70 years old. Performed at [facility], [place] [address], [place], [place] [address]      Labs: BNP (last 3 results) Recent Labs    02/26/21 1541  BNP [id number]   Basic Metabolic Panel: Recent Labs  Lab 02/27/21 0332 02/28/21 0227 03/01/21 0523 03/02/21 0414 03/03/21 0425  NA 142 140 136 140 137  K 3.7 3.2* 3.6 3.7 3.7  CL 112* 110 109 110 112*  CO2 '24 25 23 23 23  '$ GLUCOSE 124* 98 88 79 95  BUN '16 18 17 19 20  '$ CREATININE 2.19* 2.19* 2.27* 2.21* 2.20*  CALCIUM 7.7* 7.5* 7.7* 7.6* 7.4*  MG 1.9  --  1.8  --  1.8  PHOS 3.4  --  3.3  --  3.3   Liver Function Tests: Recent Labs  Lab 02/26/21 1541 02/27/21 0332 03/03/21 0425  AST 24 23 39  ALT '16 16 20  '$ ALKPHOS 64 61 57  BILITOT 0.5 0.7 0.5  PROT 5.0* 4.9* 4.2*  ALBUMIN 2.3* 2.3* 1.8*   Recent Labs  Lab 02/26/21 1820  LIPASE 23   No results for input(s): AMMONIA in the last 168 hours. CBC: Recent Labs  Lab 02/26/21 1541 02/27/21 0332 03/01/21 0523 03/03/21 0425  WBC 8.6 10.9* 9.2 7.6  NEUTROABS 5.7 8.2* 6.1 4.0  HGB 10.8* 11.5* 10.7* 9.9*  HCT 35.2* 35.4* 33.7* 30.9*  MCV 78.4* 76.1* 76.8* 76.7*  PLT 308 294 317 254   Cardiac Enzymes: No results for input(s): CKTOTAL, CKMB, CKMBINDEX, TROPONINI in the last 168 hours. BNP: Invalid input(s): POCBNP CBG: Recent Labs  Lab 03/02/21 0748  GLUCAP 82   D-Dimer No results for input(s):  DDIMER in the last 72 hours. Hgb A1c No results for input(s): HGBA1C in the last 72 hours. Lipid Profile No results for input(s): CHOL, HDL, LDLCALC, TRIG, CHOLHDL, LDLDIRECT in the last 72 hours. Thyroid function studies No results for input(s): TSH, T4TOTAL, T3FREE, THYROIDAB in the last 72 hours.  Invalid input(s): FREET3 Anemia work up No results for input(s):  VITAMINB12, FOLATE, FERRITIN, TIBC, IRON, RETICCTPCT in the last 72 hours. Urinalysis    Component Value Date/Time   COLORURINE YELLOW (A) 02/28/2021 0043   APPEARANCEUR CLEAR (A) 02/28/2021 0043   LABSPEC 1.015 02/28/2021 0043   PHURINE 6.5 02/28/2021 0043   GLUCOSEU 100 (A) 02/28/2021 0043   HGBUR MODERATE (A) 02/28/2021 0043   BILIRUBINUR NEGATIVE 02/28/2021 0043   KETONESUR NEGATIVE 02/28/2021 0043   PROTEINUR >300 (A) 02/28/2021 0043   NITRITE NEGATIVE 02/28/2021 0043   LEUKOCYTESUR NEGATIVE 02/28/2021 0043   Sepsis Labs Invalid input(s): PROCALCITONIN,  WBC,  LACTICIDVEN Microbiology Recent Results (from the past 240 hour(s))  Urine Culture     Status: Abnormal   Collection Time: 02/26/21  4:02 PM   Specimen: Urine, Clean Catch  Result Value Ref Range Status   Specimen Description   Final    URINE, CLEAN CATCH Performed at [facility], [place] [address] [place], [place] [address]    Special Requests   Final    NONE Performed at [facility], [place] [address], [place], [place] [address]    Culture (A)  Final    <10,000 COLONIES/mL INSIGNIFICANT GROWTH Performed at [place] [address] [place], [place] [address]    Report Status 02/28/2021 FINAL  Final  SARS CORONAVIRUS 2 (TAT 6-24 HRS) Nasopharyngeal Nasopharyngeal Swab     Status: None   Collection Time: 02/27/21 12:31 AM   Specimen: Nasopharyngeal Swab  Result Value Ref Range Status   SARS Coronavirus 2 NEGATIVE NEGATIVE Final    Comment: (NOTE) SARS-CoV-2 target nucleic acids are NOT DETECTED.  The SARS-CoV-2 RNA is generally detectable in upper and lower respiratory specimens during the acute phase of infection. Negative results do not preclude SARS-CoV-2 infection, do not rule out co-infections with other pathogens, and should not be used as the sole basis for treatment or other patient management decisions. Negative results must be combined with clinical  observations, patient history, and epidemiological information. The expected result is Negative.  Fact Sheet for Patients: SugarRoll.be  Fact Sheet for Healthcare Providers: https://www.woods-mathews.com/  This test is not yet approved or cleared by the [place] FDA and  has been authorized for detection and/or diagnosis of SARS-CoV-2 by FDA under an Emergency Use Authorization (EUA). This EUA will remain  in effect (meaning this test can be used) for the duration of the COVID-19 declaration under Se ction 564(b)(1) of the Act, 21 U.S.C. section 360bbb-3(b)(1), unless the authorization is terminated or revoked sooner.  Performed at [facility], [place] [address] [place], [place] [address]   MRSA Next Gen by PCR, Nasal     Status: None   Collection Time: 02/27/21  1:03 AM   Specimen: Nasal Mucosa; Nasal Swab  Result Value Ref Range Status   MRSA by PCR Next Gen NOT DETECTED NOT DETECTED Final    Comment: (NOTE) The GeneXpert MRSA Assay (FDA approved for NASAL specimens only), is one component of a comprehensive MRSA colonization surveillance program. It is not intended to diagnose MRSA infection nor to guide or monitor treatment for MRSA infections. Test performance  is not FDA approved in patients less than 69 years old. Performed at [facility], [place] [address] [place], [place] [address]      Time coordinating discharge:  35 minutes  SIGNED:   Barb Merino, MD  [facility] 03/03/2021, 11:22 AM

## 2021-03-13 ENCOUNTER — Inpatient Hospital Stay (HOSPITAL_COMMUNITY)
Admission: EM | Admit: 2021-03-13 | Discharge: 2021-03-19 | DRG: 065 | Disposition: A | Discharge status: Skilled Nursing Facility | Payer: 59 | Attending: Internal Medicine | Admitting: Internal Medicine

## 2021-03-13 ENCOUNTER — Inpatient Hospital Stay (HOSPITAL_COMMUNITY): Payer: 59

## 2021-03-13 ENCOUNTER — Emergency Department (HOSPITAL_COMMUNITY): Payer: 59

## 2021-03-13 DIAGNOSIS — N184 Chronic kidney disease, stage 4 (severe): Secondary | ICD-10-CM | POA: Diagnosis present

## 2021-03-13 DIAGNOSIS — Z9114 Patient's other noncompliance with medication regimen: Secondary | ICD-10-CM | POA: Diagnosis not present

## 2021-03-13 DIAGNOSIS — R4701 Aphasia: Secondary | ICD-10-CM | POA: Diagnosis present

## 2021-03-13 DIAGNOSIS — I674 Hypertensive encephalopathy: Secondary | ICD-10-CM | POA: Diagnosis not present

## 2021-03-13 DIAGNOSIS — Z96641 Presence of right artificial hip joint: Secondary | ICD-10-CM | POA: Diagnosis present

## 2021-03-13 DIAGNOSIS — I129 Hypertensive chronic kidney disease with stage 1 through stage 4 chronic kidney disease, or unspecified chronic kidney disease: Secondary | ICD-10-CM | POA: Diagnosis not present

## 2021-03-13 DIAGNOSIS — I69351 Hemiplegia and hemiparesis following cerebral infarction affecting right dominant side: Secondary | ICD-10-CM

## 2021-03-13 DIAGNOSIS — I6389 Other cerebral infarction: Secondary | ICD-10-CM | POA: Diagnosis not present

## 2021-03-13 DIAGNOSIS — F0390 Unspecified dementia without behavioral disturbance: Secondary | ICD-10-CM | POA: Diagnosis present

## 2021-03-13 DIAGNOSIS — G8194 Hemiplegia, unspecified affecting left nondominant side: Secondary | ICD-10-CM | POA: Diagnosis present

## 2021-03-13 DIAGNOSIS — E785 Hyperlipidemia, unspecified: Secondary | ICD-10-CM | POA: Diagnosis present

## 2021-03-13 DIAGNOSIS — Z20822 Contact with and (suspected) exposure to covid-19: Secondary | ICD-10-CM | POA: Diagnosis present

## 2021-03-13 DIAGNOSIS — I610 Nontraumatic intracerebral hemorrhage in hemisphere, subcortical: Principal | ICD-10-CM

## 2021-03-13 DIAGNOSIS — R29709 NIHSS score 9: Secondary | ICD-10-CM | POA: Diagnosis present

## 2021-03-13 DIAGNOSIS — R4781 Slurred speech: Secondary | ICD-10-CM | POA: Diagnosis not present

## 2021-03-13 DIAGNOSIS — Z23 Encounter for immunization: Secondary | ICD-10-CM

## 2021-03-13 DIAGNOSIS — E1122 Type 2 diabetes mellitus with diabetic chronic kidney disease: Secondary | ICD-10-CM | POA: Diagnosis present

## 2021-03-13 DIAGNOSIS — R29810 Facial weakness: Secondary | ICD-10-CM | POA: Diagnosis present

## 2021-03-13 DIAGNOSIS — I161 Hypertensive emergency: Secondary | ICD-10-CM | POA: Diagnosis present

## 2021-03-13 DIAGNOSIS — F40240 Claustrophobia: Secondary | ICD-10-CM | POA: Diagnosis present

## 2021-03-13 DIAGNOSIS — K208 Other esophagitis without bleeding: Secondary | ICD-10-CM | POA: Diagnosis present

## 2021-03-13 DIAGNOSIS — D509 Iron deficiency anemia, unspecified: Secondary | ICD-10-CM | POA: Diagnosis present

## 2021-03-13 DIAGNOSIS — I130 Hypertensive heart and chronic kidney disease with heart failure and stage 1 through stage 4 chronic kidney disease, or unspecified chronic kidney disease: Secondary | ICD-10-CM | POA: Diagnosis present

## 2021-03-13 DIAGNOSIS — B004 Herpesviral encephalitis: Secondary | ICD-10-CM | POA: Diagnosis not present

## 2021-03-13 DIAGNOSIS — Z79899 Other long term (current) drug therapy: Secondary | ICD-10-CM | POA: Diagnosis not present

## 2021-03-13 DIAGNOSIS — R471 Dysarthria and anarthria: Secondary | ICD-10-CM | POA: Diagnosis present

## 2021-03-13 DIAGNOSIS — I5032 Chronic diastolic (congestive) heart failure: Secondary | ICD-10-CM | POA: Diagnosis present

## 2021-03-13 DIAGNOSIS — Z8673 Personal history of transient ischemic attack (TIA), and cerebral infarction without residual deficits: Secondary | ICD-10-CM | POA: Diagnosis not present

## 2021-03-13 DIAGNOSIS — I10 Essential (primary) hypertension: Secondary | ICD-10-CM

## 2021-03-13 DIAGNOSIS — I619 Nontraumatic intracerebral hemorrhage, unspecified: Principal | ICD-10-CM | POA: Diagnosis present

## 2021-03-13 DIAGNOSIS — B0089 Other herpesviral infection: Secondary | ICD-10-CM | POA: Diagnosis present

## 2021-03-13 LAB — LIPASE, BLOOD: Lipase: 27 U/L (ref 11–51)

## 2021-03-13 LAB — CBC WITH DIFFERENTIAL/PLATELET
Abs Immature Granulocytes: 0.02 10*3/uL (ref 0.00–0.07)
Basophils Absolute: 0.1 10*3/uL (ref 0.0–0.1)
Basophils Relative: 1 %
Eosinophils Absolute: 0.2 10*3/uL (ref 0.0–0.5)
Eosinophils Relative: 2 %
HCT: 39.6 % (ref 39.0–52.0)
Hemoglobin: 12.6 g/dL — ABNORMAL LOW (ref 13.0–17.0)
Immature Granulocytes: 0 %
Lymphocytes Relative: 20 %
Lymphs Abs: 2.2 10*3/uL (ref 0.7–4.0)
MCH: 25.6 pg — ABNORMAL LOW (ref 26.0–34.0)
MCHC: 31.8 g/dL (ref 30.0–36.0)
MCV: 80.5 fL (ref 80.0–100.0)
Monocytes Absolute: 0.6 10*3/uL (ref 0.1–1.0)
Monocytes Relative: 6 %
Neutro Abs: 8 10*3/uL — ABNORMAL HIGH (ref 1.7–7.7)
Neutrophils Relative %: 71 %
Platelets: 343 10*3/uL (ref 150–400)
RBC: 4.92 MIL/uL (ref 4.22–5.81)
RDW: 25.5 % — ABNORMAL HIGH (ref 11.5–15.5)
WBC: 11.1 10*3/uL — ABNORMAL HIGH (ref 4.0–10.5)
nRBC: 0 % (ref 0.0–0.2)

## 2021-03-13 LAB — URINALYSIS, ROUTINE W REFLEX MICROSCOPIC
Bilirubin Urine: NEGATIVE
Glucose, UA: 250 mg/dL — AB
Ketones, ur: NEGATIVE mg/dL
Leukocytes,Ua: NEGATIVE
Nitrite: NEGATIVE
Protein, ur: >300 mg/dL — AB
Specific Gravity, Urine: 1.015 (ref 1.005–1.030)
pH: 7.5 (ref 5.0–8.0)

## 2021-03-13 LAB — COMPREHENSIVE METABOLIC PANEL
ALT: 19 U/L (ref 0–44)
AST: 29 U/L (ref 15–41)
Albumin: 2 g/dL — ABNORMAL LOW (ref 3.5–5.0)
Alkaline Phosphatase: 79 U/L (ref 38–126)
Anion gap: 6 (ref 5–15)
BUN: 15 mg/dL (ref 8–23)
CO2: 26 mmol/L (ref 22–32)
Calcium: 8 mg/dL — ABNORMAL LOW (ref 8.9–10.3)
Chloride: 108 mmol/L (ref 98–111)
Creatinine, Ser: 1.91 mg/dL — ABNORMAL HIGH (ref 0.61–1.24)
GFR, Estimated: 34 mL/min — ABNORMAL LOW (ref >60)
Glucose, Bld: 110 mg/dL — ABNORMAL HIGH (ref 70–99)
Potassium: 3.8 mmol/L (ref 3.5–5.1)
Sodium: 140 mmol/L (ref 135–145)
Total Bilirubin: 0.4 mg/dL (ref 0.3–1.2)
Total Protein: 4.9 g/dL — ABNORMAL LOW (ref 6.5–8.1)

## 2021-03-13 LAB — PROTIME-INR
INR: 1 (ref 0.8–1.2)
Prothrombin Time: 13.2 seconds (ref 11.4–15.2)

## 2021-03-13 LAB — APTT: aPTT: 25 seconds (ref 24–36)

## 2021-03-13 MED ORDER — NICARDIPINE HCL IN NACL 20-0.86 MG/200ML-% IV SOLN
3.0000 mg/h | INTRAVENOUS | Status: DC
  Administered 2021-03-13: 5 mg/h via INTRAVENOUS
  Administered 2021-03-13: 7.5 mg/h via INTRAVENOUS
  Administered 2021-03-14: 5 mg/h via INTRAVENOUS
  Filled 2021-03-13 (×3): qty 200

## 2021-03-13 MED ORDER — STROKE: EARLY STAGES OF RECOVERY BOOK
Freq: Once | Status: DC
  Filled 2021-03-13: qty 1

## 2021-03-13 MED ORDER — ACETAMINOPHEN 160 MG/5ML PO SOLN: 650.0000 mg | ORAL | Status: DC | PRN

## 2021-03-13 MED ORDER — ONDANSETRON HCL 4 MG/2ML IJ SOLN
4.0000 mg | Freq: Once | INTRAMUSCULAR | Status: AC
  Administered 2021-03-13: 4 mg via INTRAVENOUS
  Filled 2021-03-13: qty 2

## 2021-03-13 MED ORDER — ACETAMINOPHEN 325 MG PO TABS
650.0000 mg | ORAL_TABLET | ORAL | Status: DC | PRN
  Administered 2021-03-15: 650 mg via ORAL
  Filled 2021-03-13: qty 2

## 2021-03-13 MED ORDER — ACETAMINOPHEN 650 MG RE SUPP: 650.0000 mg | RECTAL | Status: DC | PRN

## 2021-03-13 MED ORDER — CHLORHEXIDINE GLUCONATE CLOTH 2 % EX PADS: 6.0000 | MEDICATED_PAD | Freq: Every day | CUTANEOUS | Status: DC

## 2021-03-13 MED ORDER — SENNOSIDES-DOCUSATE SODIUM 8.6-50 MG PO TABS
1.0000 | ORAL_TABLET | Freq: Two times a day (BID) | ORAL | Status: DC
  Administered 2021-03-14 – 2021-03-19 (×9): 1 via ORAL
  Filled 2021-03-13 (×10): qty 1

## 2021-03-13 MED ORDER — PANTOPRAZOLE SODIUM 40 MG IV SOLR
40.0000 mg | Freq: Every day | INTRAVENOUS | Status: DC
  Administered 2021-03-14: 40 mg via INTRAVENOUS
  Filled 2021-03-13: qty 40

## 2021-03-13 MED ORDER — LABETALOL HCL 5 MG/ML IV SOLN
20.0000 mg | Freq: Once | INTRAVENOUS | Status: AC
  Administered 2021-03-13: 20 mg via INTRAVENOUS
  Filled 2021-03-13: qty 4

## 2021-03-13 NOTE — ED Notes (Signed)
Pt cleaned up and readjusted in the bed

## 2021-03-13 NOTE — ED Notes (Signed)
Pt transported to CT ?

## 2021-03-13 NOTE — ED Notes (Signed)
This RN and Isaic RN unsuccessful at IV attempt . STAT IV team consult placed

## 2021-03-13 NOTE — H&P (Signed)
Neurology H&P  CC: Vomiting  History is obtained from:Chart review  HPI: Logan [name] is a 86 y.o. male with a history of dementia, herpes esophagitis, hyperlipidema, stroke with right sided deficits who presents with vomiting. Given history of stroke , CT scan was obtained in the ED and reveals a basal ganglia hemorrhage.  History is limited due to the patient's language barrier and dementia.  I tried to speak with him through the translator, and he answered with his name and follow to commands, but refused or was unable to comply further.   Of note, he was recently admitted for herpes esophagitis.  LKW: Unclear tpa given?: No, ICH IR Thrombectomy? No, ICH NIHSS score: 9 1A: Level of Consciousness - 0 1B: Ask Month and Age - 2 1C: 'Blink Eyes' & 'Squeeze Hands' - 0 2: Test Horizontal Extraocular Movements - 0 3: Test Visual Fields - 0 4: Test Facial Palsy - 0 5A: Test Left Arm Motor Drift - 0 5B: Test Right Arm Motor Drift - 0 6A: Test Left Leg Motor Drift - 2 6B: Test Right Leg Motor Drift - 2 7: Test Limb Ataxia - 0 8: Test Sensation - 0 9: Test Language/Aphasia- 1 10: Test Dysarthria - 1 11: Test Extinction/Inattention - 1    ROS: Unable to obtain due to altered mental status.   Past Medical History:  Diagnosis Date   Anemia    Cataract    Esophageal ulcer    Gastritis    proximal   Hiatal hernia    Hyperlipidemia    Intractable hiccups    Reflux esophagitis    Stroke ([place])    r side deficits; September 2017   Upper GI bleed      Family History  Problem Relation Age of Onset   Colon cancer Neg Hx    Esophageal cancer Neg Hx    Rectal cancer Neg Hx      Social History:  reports that he has never smoked. He has never used smokeless tobacco. He reports that he does not drink alcohol and does not use drugs.   Prior to Admission medications   Medication Sig Start Date End Date Taking? Authorizing Provider  acetaminophen (TYLENOL) 500 MG tablet Take  1,000 mg by mouth every 6 (six) hours as needed for mild pain.   Yes [provider]  acyclovir (ZOVIRAX) 200 MG/5ML suspension Take 5 mLs (200 mg total) by mouth every 8 (eight) hours for 10 days. 03/03/21 03/13/21 Yes Ghimire, Dante Gang, MD  amLODipine (NORVASC) 10 MG tablet Take 1 tablet (10 mg total) by mouth daily. 03/04/21 04/03/21 Yes Ghimire, Dante Gang, MD  atorvastatin (LIPITOR) 20 MG tablet Take 20 mg by mouth daily. 01/12/21  Yes [provider]  carvedilol (COREG) 12.5 MG tablet Take 1 tablet (12.5 mg total) by mouth 2 (two) times daily. 03/03/21 04/02/21 Yes Barb Merino, MD  ferrous sulfate 300 (60 Fe) MG/5ML syrup Take 5 mLs (300 mg total) by mouth daily. 03/03/21 03/03/22 Yes Ghimire, Dante Gang, MD  lactulose (CHRONULAC) 10 GM/15ML solution Take 10 g by mouth daily as needed for moderate constipation. 02/23/21  Yes [provider]  lidocaine (XYLOCAINE) 2 % solution Use as directed 15 mLs in the mouth or throat 3 (three) times daily before meals for 10 days. 03/03/21 03/13/21 Yes Ghimire, Dante Gang, MD  pantoprazole (PROTONIX) 40 MG tablet Take 1 tablet (40 mg total) 2 (two) times daily by mouth. Patient taking differently: Take 40 mg by mouth daily. 05/10/17  Yes  Georgette Shell, MD  feeding supplement, ENSURE ENLIVE, (ENSURE ENLIVE) LIQD Take 237 mLs by mouth 2 (two) times daily between meals. Patient not taking: No sig reported 04/13/17   [place] Jansky, MD     Exam: Current vital signs: BP 129/72   Pulse 98   Temp (!) 97.2 F (36.2 C) (Temporal)   Resp 13   SpO2 100%    Physical Exam  Constitutional: Appears well-developed and well-nourished.  Psych: Affect appropriate to situation Eyes: No scleral injection HENT: No OP obstrucion Head: Normocephalic.  Cardiovascular: Normal rate and regular rhythm.  Respiratory: Effort normal and breath sounds normal to anterior ascultation GI: Soft.  No distension. There is no tenderness.  Skin: WDI  Neuro: Mental  Status: Patient is awake and alert, he tells the translator his name, and answers that he is not in pain.  Other questions he responds to with incomprehensible grunts or not at all.  He does follow commands to close eyes and squeeze hands, but nothing more complicated such as finger-nose-finger. Cranial Nerves: II: Visual Fields are full. Pupils are equal, round, and reactive to light.   III,IV, VI: EOMI without ptosis or diplopia.  V: He responds to stimulation on the face bilaterally VII: Facial movement is symmetric.  VIII: hearing is intact to voice Motor: He moves all extremities voluntarily, I suspect a right leg paresis, but he does not comply with formal testing Sensory: Response to noxious stimulation bilaterally Cerebellar: Does not perform  I have reviewed labs in epic and the pertinent results are: Results for orders placed or performed during the hospital encounter of 03/13/21 (from the past 24 hour(s))  Comprehensive metabolic panel     Status: Abnormal   Collection Time: 03/13/21  4:10 PM  Result Value Ref Range   Sodium 140 135 - 145 mmol/L   Potassium 3.8 3.5 - 5.1 mmol/L   Chloride 108 98 - 111 mmol/L   CO2 26 22 - 32 mmol/L   Glucose, Bld 110 (H) 70 - 99 mg/dL   BUN 15 8 - 23 mg/dL   Creatinine, Ser 1.91 (H) 0.61 - 1.24 mg/dL   Calcium 8.0 (L) 8.9 - 10.3 mg/dL   Total Protein 4.9 (L) 6.5 - 8.1 g/dL   Albumin 2.0 (L) 3.5 - 5.0 g/dL   AST 29 15 - 41 U/L   ALT 19 0 - 44 U/L   Alkaline Phosphatase 79 38 - 126 U/L   Total Bilirubin 0.4 0.3 - 1.2 mg/dL   GFR, Estimated 34 (L) >60 mL/min   Anion gap 6 5 - 15  Lipase, blood     Status: None   Collection Time: 03/13/21  4:10 PM  Result Value Ref Range   Lipase 27 11 - 51 U/L  CBC with Diff     Status: Abnormal   Collection Time: 03/13/21  4:10 PM  Result Value Ref Range   WBC 11.1 (H) 4.0 - 10.5 K/uL   RBC 4.92 4.22 - 5.81 MIL/uL   Hemoglobin 12.6 (L) 13.0 - 17.0 g/dL   HCT 39.6 39.0 - 52.0 %   MCV 80.5 80.0 -  100.0 fL   MCH 25.6 (L) 26.0 - 34.0 pg   MCHC 31.8 30.0 - 36.0 g/dL   RDW 25.5 (H) 11.5 - 15.5 %   Platelets 343 150 - 400 K/uL   nRBC 0.0 0.0 - 0.2 %   Neutrophils Relative % 71 %   Neutro Abs 8.0 (H) 1.7 - 7.7 K/uL  Lymphocytes Relative 20 %   Lymphs Abs 2.2 0.7 - 4.0 K/uL   Monocytes Relative 6 %   Monocytes Absolute 0.6 0.1 - 1.0 K/uL   Eosinophils Relative 2 %   Eosinophils Absolute 0.2 0.0 - 0.5 K/uL   Basophils Relative 1 %   Basophils Absolute 0.1 0.0 - 0.1 K/uL   Immature Granulocytes 0 %   Abs Immature Granulocytes 0.02 0.00 - 0.07 K/uL  Protime-INR     Status: None   Collection Time: 03/13/21  5:28 PM  Result Value Ref Range   Prothrombin Time 13.2 11.4 - 15.2 seconds   INR 1.0 0.8 - 1.2  APTT     Status: None   Collection Time: 03/13/21  5:28 PM  Result Value Ref Range   aPTT 25 24 - 36 seconds  Urinalysis, Routine w reflex microscopic     Status: Abnormal   Collection Time: 03/13/21  8:17 PM  Result Value Ref Range   Color, Urine YELLOW (A) YELLOW   APPearance CLEAR (A) CLEAR   Specific Gravity, Urine 1.015 1.005 - 1.030   pH 7.5 5.0 - 8.0   Glucose, UA 250 (A) NEGATIVE mg/dL   Hgb urine dipstick MODERATE (A) NEGATIVE   Bilirubin Urine NEGATIVE NEGATIVE   Ketones, ur NEGATIVE NEGATIVE mg/dL   Protein, ur >300 (A) NEGATIVE mg/dL   Nitrite NEGATIVE NEGATIVE   Leukocytes,Ua NEGATIVE NEGATIVE   RBC / HPF 6-10 0 - 5 RBC/hpf   WBC, UA 0-5 0 - 5 WBC/hpf   Bacteria, UA RARE (A) NONE SEEN   Squamous Epithelial / LPF 0-5 0 - 5     I have reviewed the images obtained: CT head -subcortical hemorrhage on the left  Primary Diagnosis:  Nontraumatic 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  He has fairly  dementia, but family has indicated a willingness to pursue feeding tubes and he is full code as of his most recent discharge.  He will need aggressive blood pressure control   Plan: 1) Admit to ICU 2) no antiplatelets or anticoagulants 3) blood pressure control with goal systolic [id number] 4) Frequent neuro checks 5) If symptoms worsen or there is decreased mental status, repeat stat head CT 6) PT,OT,ST  Esophagitis Acyclovir finished yesterday Continue PPI    This patient is critically ill and at significant risk of neurological worsening, death and care requires constant monitoring of vital signs, hemodynamics,respiratory and cardiac monitoring, neurological assessment, discussion with family, other specialists and medical decision making of high complexity. I spent 55 minutes of neurocritical care time  in the care of  this patient. This was time spent independent of any time provided by nurse practitioner or PA.  Roland Rack, MD [facility] [phone]  If 7pm- 7am, please page neurology on call as listed in [place].

## 2021-03-13 NOTE — ED Provider Notes (Signed)
[facility] DEPT Provider Note   CSN: [id number] Arrival date & time: 03/13/21  1349     History Chief Complaint  Patient presents with   Emesis    Logan [name] is a 86 y.o. male.   Emesis  Patient presented to the ED for evaluation of emesis.  According to the EMS report and family the patient began having issues with vomiting last night.  He had 1 episode again this morning.  The emesis was brown in color.  According to EMS report this has been a recurrent issue.  Patient does have history of dementia as well as stroke.  History is limited.  We attempted to use the language translator but the patient was not able to provide any further information.  He will nod his head and answer yes and no but the answers are not consistent  Past Medical History:  Diagnosis Date   Anemia    Cataract    Esophageal ulcer    Gastritis    proximal   Hiatal hernia    Hyperlipidemia    Intractable hiccups    Reflux esophagitis    Stroke ([place])    r side deficits; September 2017   Upper GI bleed     Patient Active Problem List   Diagnosis Date Noted   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   Failure to thrive in adult 02/26/2021   Proteinuria 02/12/2021   Intractable abdominal pain 02/11/2021   AKI (acute kidney injury) ([place]) 02/11/2021   Anemia 02/11/2021   CKD (chronic kidney disease), stage III ([place]) 02/11/2021   Benign essential HTN 02/11/2021   Acute on chronic renal insufficiency    Hypoalbuminemia    Acute systolic CHF (congestive heart failure) (HCC)    S/P total right hip arthroplasty 04/30/2020   Abnormal CT scan, esophagus    Acute upper GI bleed 05/06/2017   Hyperkalemia 05/06/2017   Malnutrition of moderate degree 04/13/2017   Gastroesophageal reflux disease with esophagitis    Hyperglycemia 04/11/2017   Malnutrition of mild degree ([place]) 04/11/2017   Intractable vomiting with nausea    Physical deconditioning    Iron deficiency anemia     Upper GI bleed    Esophageal ulcer with bleeding    Cardiomegaly    Chronic diastolic CHF (congestive heart failure) (HCC)    Intractable hiccups    Cerebrovascular accident (CVA) ([place])    SVT (supraventricular tachycardia) ([place])    Hematemesis 03/03/2017    Past Surgical History:  Procedure Laterality Date   BIOPSY  02/11/2021   Procedure: BIOPSY;  Surgeon: Clarene Essex, MD;  Location: [facility] ENDOSCOPY;  Service: Endoscopy;;   COLONOSCOPY     ESOPHAGOGASTRODUODENOSCOPY     ESOPHAGOGASTRODUODENOSCOPY N/A 03/04/2017   Procedure: ESOPHAGOGASTRODUODENOSCOPY (EGD);  Surgeon: Mauri Pole, MD;  Location: [facility] ENDOSCOPY;  Service: Endoscopy;  Laterality: N/A;   ESOPHAGOGASTRODUODENOSCOPY (EGD) WITH PROPOFOL N/A 02/11/2021   Procedure: ESOPHAGOGASTRODUODENOSCOPY (EGD) WITH PROPOFOL;  Surgeon: Clarene Essex, MD;  Location: [facility] ENDOSCOPY;  Service: Endoscopy;  Laterality: N/A;   UPPER GASTROINTESTINAL ENDOSCOPY         Family History  Problem Relation Age of Onset   Colon cancer Neg Hx    Esophageal cancer Neg Hx    Rectal cancer Neg Hx     Social History   Tobacco Use   Smoking status: Never   Smokeless tobacco: Never  Vaping Use   Vaping Use: Never used  Substance Use Topics   Alcohol use: No   Drug  use: No    Home Medications Prior to Admission medications   Medication Sig Start Date End Date Taking? Authorizing Provider  acetaminophen (TYLENOL) 500 MG tablet Take 1,000 mg by mouth every 6 (six) hours as needed for mild pain.    [provider]  acyclovir (ZOVIRAX) 200 MG/5ML suspension Take 5 mLs (200 mg total) by mouth every 8 (eight) hours for 10 days. 03/03/21 03/13/21  Barb Merino, MD  amLODipine (NORVASC) 10 MG tablet Take 1 tablet (10 mg total) by mouth daily. 03/04/21 04/03/21  Barb Merino, MD  atorvastatin (LIPITOR) 20 MG tablet Take 20 mg by mouth daily. 01/12/21   [provider]  carvedilol (COREG) 12.5 MG tablet Take 1 tablet (12.5 mg total) by  mouth 2 (two) times daily. 03/03/21 04/02/21  Barb Merino, MD  feeding supplement, ENSURE ENLIVE, (ENSURE ENLIVE) LIQD Take 237 mLs by mouth 2 (two) times daily between meals. Patient not taking: No sig reported 04/13/17   Hongalgi, Lenis Dickinson, MD  ferrous sulfate 300 (60 Fe) MG/5ML syrup Take 5 mLs (300 mg total) by mouth daily. 03/03/21 03/03/22  Barb Merino, MD  lactulose (CHRONULAC) 10 GM/15ML solution Take 10 g by mouth daily as needed for moderate constipation. 02/23/21   [provider]  lidocaine (XYLOCAINE) 2 % solution Use as directed 15 mLs in the mouth or throat 3 (three) times daily before meals for 10 days. 03/03/21 03/13/21  Barb Merino, MD  pantoprazole (PROTONIX) 40 MG tablet Take 1 tablet (40 mg total) 2 (two) times daily by mouth. Patient taking differently: Take 40 mg by mouth daily. 05/10/17   Georgette Shell, MD    Allergies    Patient has no known allergies.  Review of Systems   Review of Systems  Gastrointestinal:  Positive for vomiting.  All other systems reviewed and are negative.  Physical Exam Updated Vital Signs BP (!) 203/123   Pulse (!) 103   Temp 97.9 F (36.6 C) (Oral)   Resp 12   SpO2 98%   Physical Exam Vitals and nursing note reviewed.  Constitutional:      Appearance: He is well-developed. He is not ill-appearing or diaphoretic.     Comments: Elderly, frail  HENT:     Head: Normocephalic and atraumatic.     Right Ear: External ear normal.     Left Ear: External ear normal.  Eyes:     General: No scleral icterus.       Right eye: No discharge.        Left eye: No discharge.     Conjunctiva/sclera: Conjunctivae normal.  Neck:     Trachea: No tracheal deviation.  Cardiovascular:     Rate and Rhythm: Normal rate and regular rhythm.  Pulmonary:     Effort: Pulmonary effort is normal. No respiratory distress.     Breath sounds: Normal breath sounds. No stridor. No wheezing or rales.  Abdominal:     General: Bowel sounds are  normal. There is no distension.     Palpations: Abdomen is soft.     Tenderness: There is no abdominal tenderness. There is no guarding or rebound.  Musculoskeletal:        General: No tenderness or deformity.     Cervical back: Neck supple.  Skin:    General: Skin is warm and dry.     Findings: No rash.  Neurological:     General: No focal deficit present.     Mental Status: He is alert.  Cranial Nerves: No cranial nerve deficit (no facial droop, extraocular movements intact,).     Sensory: No sensory deficit.     Motor: No abnormal muscle tone or seizure activity.     Coordination: Coordination normal.  Psychiatric:        Mood and Affect: Mood normal.    ED Results / Procedures / Treatments   Labs (all labs ordered are listed, but only abnormal results are displayed) Labs Reviewed - No data to display  EKG None  Radiology No results found.  Procedures Procedures   Medications Ordered in ED Medications - No data to display  ED Course  I have reviewed the triage vital signs and the nursing notes.  Pertinent labs & imaging results that were available during my care of the patient were reviewed by me and considered in my medical decision making (see chart for details).    MDM Rules/Calculators/A&P                           Patient presents with vomiting.  Patient noted to be very hypertensive.  Patient does appear comfortable at the bedside.  We will plan on IV medications for nausea as well as blood pressure.  Previous records reviewed and patient was admitted for hypertensive urgency on September 1.  Presentation concerning for the same, acute CNS event.  Labs and imagine tests ordered.  All pending at time of shift change.  Care turned over to Dr Dina Rich. Final Clinical Impression(s) / ED Diagnoses     Dorie Rank, MD 03/14/21 [phone]

## 2021-03-13 NOTE — ED Provider Notes (Signed)
Patient transferred from [facility] for evaluation by neurology and admission to neuro ICU for intracranial hemorrhage.  On my assessment, patient is resting comfortably and moving all extremities.  Symmetric smile.  Pupils symmetric and reactive with normal extraocular movements.  Lungs clear and chest nontender.  Patient well-appearing with blood pressure around 120.  Neurology was called who will come see patient and admit for further management.  Patient admitted.   Xcaret Morad, Gwenyth Allegra, MD 03/13/21 2159

## 2021-03-13 NOTE — ED Notes (Signed)
IV team at bedside 

## 2021-03-13 NOTE — ED Triage Notes (Signed)
Pt transferred via [facility] from [facility] for left thalamic hematoma. Pt had cardene runninbg at 7.'5mg'$ /hr. NIH 0. BP 146/99, O2 98% RA. 20G LAC placed PTA.

## 2021-03-13 NOTE — ED Notes (Signed)
[facility] interpreter , Gu[place]ati to bedside , pt forwards little/no information with interpreter, pt does nod head

## 2021-03-13 NOTE — ED Notes (Signed)
[facility] here at this time to transport pt to [facility]

## 2021-03-13 NOTE — ED Triage Notes (Signed)
Pt to ED via EMS from home, where family cares for him. C/o emesis . Pt began vomiting last night, x 1 brown emesis. It occurred again this morning x 1 brown emesis. Family concerned because this appears to be a recurrent problem over the past few months. Limited history with EMS and family d/t language barrier. Hx dementia, Stroke- old left leg deficit. Right arm swelling noted, but not a new complaint . No medications given by EMS Last VS: 162/100, 105p, 98%RA, 192 cbg, temp 98

## 2021-03-13 NOTE — ED Provider Notes (Signed)
Patient signed out to me by previous provider.  Please refer to their note for full HPI.  Briefly this is an 86 year old male who presented to the emergency department with concern for vomiting.  Patient has dementia at baseline, he is non-English speaking.  Not cooperative with the interpreter.  Seems to localize to voice appropriately but otherwise does not correspond to help with the HPI.  Moving all 4 extremities.  Extremely hypertensive on arrival, 1 dose of IV labetalol given.  Patient is pending labs and CT of the head. Physical Exam  BP (!) 145/91   Pulse 96   Temp 97.9 F (36.6 C) (Oral)   Resp 13   SpO2 100%   Physical Exam Vitals and nursing note reviewed.  Constitutional:      General: He is not in acute distress.    Appearance: Normal appearance.  HENT:     Head: Normocephalic.     Mouth/Throat:     Mouth: Mucous membranes are moist.  Cardiovascular:     Rate and Rhythm: Normal rate.  Pulmonary:     Effort: Pulmonary effort is normal. No respiratory distress.  Skin:    General: Skin is warm.  Neurological:     Mental Status: He is alert.     Comments: Patient with dementia, non english speaking, localizes eyes to my voice, doesn't cooperate with translator  Psychiatric:        Mood and Affect: Mood normal.    ED Course/Procedures     .Critical Care Performed by: Lorelle Gibbs, DO Authorized by: Lorelle Gibbs, DO   Critical care provider statement:    Critical care time (minutes):  45   Critical care time was exclusive of:  Separately billable procedures and treating other patients   Critical care was necessary to treat or prevent imminent or life-threatening deterioration of the following conditions:  CNS failure or compromise   Critical care was time spent personally by me on the following activities:  Discussions with consultants, evaluation of patient's response to treatment, examination of patient, ordering and performing treatments and  interventions, ordering and review of laboratory studies, ordering and review of radiographic studies, pulse oximetry, re-evaluation of patient's condition, obtaining history from patient or surrogate and review of old charts  MDM   CT of the head shows left thalamic bleed with edema, no midline shift.  Most likely hypertensive bleed.  Patient has been placed on Cardene drip for blood pressure control.  He has not noted to be on any anticoagulation, coagulation studies have been ordered.  Spoke with on-call neurology, Dr. Theda Sers who is excepted the patient to be admitted to the neuro ICU.  Recommends ER to ER transfer to [facility] for more expedited specialized care.  Patient's son has been notified and understands.  He would like to be contacted.  His contact number is [phone] and his wifes number is [phone].  They are his primary caregivers.  Head of bed has been raised to 45 degrees.  Dr. Almyra Free is excepting at [facility], ER for transfer.    Patients evaluation and results requires admission for further treatment and care. Patient agrees with admission plan, offers no new complaints and is stable/unchanged at time of admit.       Lorelle Gibbs, DO 03/13/21 [id number]

## 2021-03-14 ENCOUNTER — Inpatient Hospital Stay (HOSPITAL_COMMUNITY): Payer: 59

## 2021-03-14 ENCOUNTER — Encounter (HOSPITAL_COMMUNITY): Payer: 59

## 2021-03-14 DIAGNOSIS — I161 Hypertensive emergency: Secondary | ICD-10-CM

## 2021-03-14 DIAGNOSIS — Z8673 Personal history of transient ischemic attack (TIA), and cerebral infarction without residual deficits: Secondary | ICD-10-CM

## 2021-03-14 DIAGNOSIS — I6389 Other cerebral infarction: Secondary | ICD-10-CM

## 2021-03-14 DIAGNOSIS — B004 Herpesviral encephalitis: Secondary | ICD-10-CM

## 2021-03-14 LAB — BASIC METABOLIC PANEL
Anion gap: 12 (ref 5–15)
BUN: 18 mg/dL (ref 8–23)
CO2: 20 mmol/L — ABNORMAL LOW (ref 22–32)
Calcium: 7.8 mg/dL — ABNORMAL LOW (ref 8.9–10.3)
Chloride: 107 mmol/L (ref 98–111)
Creatinine, Ser: 2.21 mg/dL — ABNORMAL HIGH (ref 0.61–1.24)
GFR, Estimated: 29 mL/min — ABNORMAL LOW (ref >60)
Glucose, Bld: 95 mg/dL (ref 70–99)
Potassium: 3.8 mmol/L (ref 3.5–5.1)
Sodium: 139 mmol/L (ref 135–145)

## 2021-03-14 LAB — HEMOGLOBIN A1C
Hgb A1c MFr Bld: 5.3 % (ref 4.8–5.6)
Mean Plasma Glucose: 105.41 mg/dL

## 2021-03-14 LAB — CBC
HCT: 36.6 % — ABNORMAL LOW (ref 39.0–52.0)
Hemoglobin: 11.6 g/dL — ABNORMAL LOW (ref 13.0–17.0)
MCH: 25.2 pg — ABNORMAL LOW (ref 26.0–34.0)
MCHC: 31.7 g/dL (ref 30.0–36.0)
MCV: 79.6 fL — ABNORMAL LOW (ref 80.0–100.0)
Platelets: 337 10*3/uL (ref 150–400)
RBC: 4.6 MIL/uL (ref 4.22–5.81)
RDW: 25.6 % — ABNORMAL HIGH (ref 11.5–15.5)
WBC: 10.1 10*3/uL (ref 4.0–10.5)
nRBC: 0 % (ref 0.0–0.2)

## 2021-03-14 LAB — SARS CORONAVIRUS 2 (TAT 6-24 HRS): SARS Coronavirus 2: NEGATIVE

## 2021-03-14 LAB — LIPID PANEL
Cholesterol: 260 mg/dL — ABNORMAL HIGH (ref 0–200)
HDL: 89 mg/dL (ref >40)
LDL Cholesterol: 148 mg/dL — ABNORMAL HIGH (ref 0–99)
Total CHOL/HDL Ratio: 2.9 RATIO
Triglycerides: 113 mg/dL (ref <150)
VLDL: 23 mg/dL (ref 0–40)

## 2021-03-14 LAB — ECHOCARDIOGRAM COMPLETE
Area-P 1/2: 5.42 cm2
P 1/2 time: 449 msec
S' Lateral: 2.5 cm

## 2021-03-14 LAB — MRSA NEXT GEN BY PCR, NASAL: MRSA by PCR Next Gen: NOT DETECTED

## 2021-03-14 MED ORDER — PANTOPRAZOLE 2 MG/ML SUSPENSION: 40.0000 mg | Freq: Every day | ORAL | Status: DC

## 2021-03-14 MED ORDER — CARVEDILOL 12.5 MG PO TABS
12.5000 mg | ORAL_TABLET | Freq: Two times a day (BID) | ORAL | Status: DC
  Administered 2021-03-14 – 2021-03-15 (×2): 12.5 mg via ORAL
  Filled 2021-03-14 (×2): qty 1

## 2021-03-14 MED ORDER — AMLODIPINE BESYLATE 10 MG PO TABS
10.0000 mg | ORAL_TABLET | Freq: Every day | ORAL | Status: DC
  Administered 2021-03-15 – 2021-03-19 (×5): 10 mg via ORAL
  Filled 2021-03-14 (×6): qty 1

## 2021-03-14 MED ORDER — ATORVASTATIN CALCIUM 10 MG PO TABS: 20.0000 mg | ORAL_TABLET | Freq: Every day | ORAL | Status: DC

## 2021-03-14 MED ORDER — LORAZEPAM 2 MG/ML IJ SOLN
2.0000 mg | Freq: Once | INTRAMUSCULAR | Status: AC
  Administered 2021-03-14: 2 mg via INTRAVENOUS
  Filled 2021-03-14: qty 1

## 2021-03-14 MED ORDER — LABETALOL HCL 5 MG/ML IV SOLN
5.0000 mg | INTRAVENOUS | Status: DC | PRN
  Administered 2021-03-15: 10 mg via INTRAVENOUS
  Filled 2021-03-14: qty 4

## 2021-03-14 MED ORDER — HALOPERIDOL LACTATE 5 MG/ML IJ SOLN
1.0000 mg | Freq: Once | INTRAMUSCULAR | Status: AC
  Administered 2021-03-15: 1 mg via INTRAVENOUS
  Filled 2021-03-14: qty 1

## 2021-03-14 MED ORDER — FERROUS SULFATE 300 (60 FE) MG/5ML PO SYRP
300.0000 mg | ORAL_SOLUTION | Freq: Every day | ORAL | Status: DC
  Administered 2021-03-14 – 2021-03-19 (×6): 300 mg via ORAL
  Filled 2021-03-14 (×6): qty 5

## 2021-03-14 NOTE — Evaluation (Signed)
Occupational Therapy Evaluation Patient Details Name: Logan [name] MRN: [id number] DOB: Oct 28, 1934 Today's Date: 03/14/2021   History of Present Illness Logan [name] is a 86 y.o. male admitted 03/13/21 with vomiting blood, elevated BP, increased weakness of R side. CT revealed basal ganglia hemorrhage. PMh includes: dementia, herpes esophagitis, hyperlipidema, previous CVA with R sided weakness.   Clinical Impression   Pt from home where he walks short distances with RW and gets min help for ADL. Today he is mod A +2 overall for transfers, even with use of interpreter (over the phone) Pt follows commands approx 25% of the time. Strength, coordination, cognitive, and communication deficits present. Overall Pt is max A at this time for ADL both upper body and lower body. OT will continue to follow acutely and recommending CIR level therapy post-acute to maximize safety and independence in ADL and functional transfers and for caregiver education.      Recommendations for follow up therapy are one component of a multi-disciplinary discharge planning process, led by the attending physician.  Recommendations may be updated based on patient status, additional functional criteria and insurance authorization.   Follow Up Recommendations  CIR    Equipment Recommendations  Wheelchair (measurements OT) (defer to next venue)    Recommendations for Other Services Rehab consult;PT consult;Speech consult     Precautions / Restrictions Precautions Precautions: Fall Precaution Comments: use interpreter Gujarati Restrictions Weight Bearing Restrictions: No      Mobility Bed Mobility Overal bed mobility: Needs Assistance Bed Mobility: Supine to Sit;Sit to Supine     Supine to sit: Mod assist;+2 for safety/equipment;HOB elevated (trunk elevation, and getting feet off bed) Sit to supine: Min assist (for BLE)   General bed mobility comments: potentially able to perform better, getting him to  understand what we wanted was half the difficulty    Transfers Overall transfer level: Needs assistance Equipment used: 2 person hand held assist Transfers: Sit to/from Stand Sit to Stand: Mod assist;+2 physical assistance;+2 safety/equipment;From elevated surface;Min assist         General transfer comment: initially min A, maintains standing for approx 10 seconds, fatigued and was mod A by 3rd sit<>stand    Balance Overall balance assessment: Needs assistance Sitting-balance support: Single extremity supported;Feet unsupported Sitting balance-Leahy Scale: Fair     Standing balance support: Bilateral upper extremity supported Standing balance-Leahy Scale: Poor Standing balance comment: requires BUE support                           ADL either performed or assessed with clinical judgement   ADL Overall ADL's : Needs assistance/impaired Eating/Feeding: NPO   Grooming: Moderate assistance;Sitting   Upper Body Bathing: Maximal assistance   Lower Body Bathing: Maximal assistance;Bed level   Upper Body Dressing : Moderate assistance;Sitting   Lower Body Dressing: Maximal assistance;Bed level   Toilet Transfer: Moderate assistance;+2 for physical assistance;+2 for safety/equipment;Stand-pivot Toilet Transfer Details (indicate cue type and reason): face to face Toileting- Clothing Manipulation and Hygiene: Total assistance       Functional mobility during ADLs: Moderate assistance;+2 for physical assistance;+2 for safety/equipment General ADL Comments: Pt with limited cognition, R sided weakness, different language. no family present to assist with translation/baseline function.     Vision   Additional Comments: not following commands well enough to perform visual assessment. Functionally tracking     Perception     Praxis      Pertinent Vitals/Pain Pain Assessment: No/denies pain Pain Intervention(s):  Monitored during session;Repositioned     Hand  Dominance Right   Extremity/Trunk Assessment Upper Extremity Assessment Upper Extremity Assessment: RUE deficits/detail;Generalized weakness RUE Deficits / Details: during MMT, able to form weak grasp, during functional tasks weak and uncoordinated RUE Coordination: decreased gross motor;decreased fine motor   Lower Extremity Assessment Lower Extremity Assessment: Defer to PT evaluation   Cervical / Trunk Assessment Cervical / Trunk Assessment: Normal   Communication Communication Communication: Prefers language other than [place];Interpreter utilized Herbalist number [phone])   Cognition Arousal/Alertness: Awake/alert   Overall Cognitive Status: Impaired/Different from baseline Area of Impairment: Following commands;Problem solving                       Following Commands: Follows one step commands inconsistently     Problem Solving: Slow processing;Decreased initiation;Requires verbal cues;Requires tactile cues General Comments: will continue to require evaluation - family prefferred if possible   General Comments       Exercises     Shoulder Instructions      Home Living Family/patient expects to be discharged to:: Private residence Living Arrangements: Children;Spouse/significant other Available Help at Discharge: Family;Available 24 hours/day Type of Home: House Home Access: Stairs to enter     Home Layout: One level     Bathroom Shower/Tub: Teacher, early years/pre: Standard     Home Equipment: Environmental consultant - 2 wheels   Additional Comments: gleaned from previous admission.      Prior Functioning/Environment Level of Independence: Needs assistance  Gait / Transfers Assistance Needed: use of walker. reports walking is very limited to approx 10 feet in home. ADL's / Homemaking Assistance Needed: has assistance with ADLs. Reports he can walk to the bathroom.   Comments: gleaned from previous admission. no family present and Pt  unable/did not respond to questions        OT Problem List: Decreased strength;Decreased activity tolerance;Decreased range of motion;Impaired balance (sitting and/or standing);Decreased cognition;Decreased safety awareness;Decreased knowledge of use of DME or AE;Decreased knowledge of precautions;Impaired UE functional use      OT Treatment/Interventions: Self-care/ADL training;DME and/or AE instruction;Therapeutic activities;Balance training;Patient/family education;Neuromuscular education;Therapeutic exercise    OT Goals(Current goals can be found in the care plan section) Acute Rehab OT Goals Time For Goal Achievement: 03/28/21 Potential to Achieve Goals: Fair ADL Goals Pt Will Perform Grooming: with min guard assist;with caregiver independent in assisting;sitting Pt Will Perform Upper Body Dressing: with min assist;with caregiver independent in assisting;sitting Pt Will Perform Lower Body Dressing: with mod assist;sit to/from stand;with caregiver independent in assisting Pt Will Transfer to Toilet: with mod assist;stand pivot transfer;bedside commode Pt Will Perform Toileting - Clothing Manipulation and hygiene: with max assist;sitting/lateral leans Additional ADL Goal #1: Pt will follow one step commands 75% of the time  OT Frequency: Min 2X/week   Barriers to D/C:            Co-evaluation PT/OT/SLP Co-Evaluation/Treatment: Yes Reason for Co-Treatment: Complexity of the patient's impairments (multi-system involvement);Necessary to address cognition/behavior during functional activity;To address functional/ADL transfers;For patient/therapist safety PT goals addressed during session: Mobility/safety with mobility;Balance;Strengthening/ROM OT goals addressed during session: ADL's and self-care;Proper use of Adaptive equipment and DME;Strengthening/ROM      AM-PAC OT "6 Clicks" Daily Activity     Outcome Measure Help from another person eating meals?: Total (NPO) Help from  another person taking care of personal grooming?: A Lot Help from another person toileting, which includes using toliet, bedpan, or urinal?: A Lot Help from another  person bathing (including washing, rinsing, drying)?: A Lot Help from another person to put on and taking off regular upper body clothing?: A Lot Help from another person to put on and taking off regular lower body clothing?: Total 6 Click Score: 10   End of Session Equipment Utilized During Treatment: Gait belt Nurse Communication: Mobility status  Activity Tolerance: Patient tolerated treatment well Patient left: in bed;with call bell/phone within reach;with bed alarm set;Other (comment) (all 4 rails up)  OT Visit Diagnosis: Other abnormalities of gait and mobility (R26.89);Muscle weakness (generalized) (M62.81);Hemiplegia and hemiparesis;Other symptoms and signs involving cognitive function;Other symptoms and signs involving the nervous system (R29.898) Hemiplegia - Right/Left: Right Hemiplegia - dominant/non-dominant: Dominant Hemiplegia - caused by: Nontraumatic intracerebral hemorrhage                Time: 1320-1349 OT Time Calculation (min): 29 min Charges:  OT General Charges $OT Visit: 1 Visit OT Evaluation $OT Eval Moderate Complexity: [place] OTR/L Acute Rehabilitation Services Pager: [phone] Office: [place] 03/14/2021, 4:26 PM

## 2021-03-14 NOTE — Progress Notes (Signed)
PT Cancellation Note  Patient Details Name: Nadav Bazurto MRN: [id number] DOB: 1935-02-15   Cancelled Treatment:    Reason Eval/Treat Not Completed: Active bedrest order  Shamon Lobo A. Gilford Rile PT, DPT Acute Rehabilitation Services Pager [phone] Office [phone]    Linna Hoff 03/14/2021, 8:25 AM

## 2021-03-14 NOTE — Progress Notes (Signed)
Pt brought down to MRI via pt transport for exam. Pt safety screened prior to coming down. Upon preparing pt and placing them in MRI scanner. Pt immediately said "no no" while waving their hands. Pt immediately removed scanner. Pt again said "no no" while shaking their head. Pt calmed down immediately upon removal from scanner. Pt appears claustrophobic. Contracted RN for meds to relax pt's phobia. RN unable to provide at this time. Pt unable go through with MRI at this time. Pt sent back to room via pt transport.

## 2021-03-14 NOTE — Progress Notes (Signed)
Received pt on the unit. Pt acclimated with the room

## 2021-03-14 NOTE — Evaluation (Signed)
Physical Therapy Evaluation Patient Details Name: Logan [name] MRN: [id number] DOB: 1935/01/08 Today's Date: 03/14/2021  History of Present Illness  Thorwald Graul is a 86 y.o. male admitted 03/13/21 with vomiting blood, elevated BP, increased weakness of R side. CT revealed basal ganglia hemorrhage. PMh includes: dementia, herpes esophagitis, hyperlipidema, previous CVA with R sided weakness.  Clinical Impression  PTA, patient lives at home and walks short distances with RW and receives assist for ADLs. Patient presents with R sided weakness, impaired cognition, decreased activity tolerance, and impaired funcitonal mobility. Patient requires modA+2 for bed mobility and transfers. Patient following ~25% of commands with use of audio interpreter. Patient will benefit from skilled PT services during acute stay to address listed deficits. Recommend CIR following discharge to maximize functional mobility and decreased burden of care.        Recommendations for follow up therapy are one component of a multi-disciplinary discharge planning process, led by the attending physician.  Recommendations may be updated based on patient status, additional functional criteria and insurance authorization.  Follow Up Recommendations CIR    Equipment Recommendations  None recommended by PT    Recommendations for Other Services Rehab consult     Precautions / Restrictions Precautions Precautions: Fall Precaution Comments: use interpreter Gujarati Restrictions Weight Bearing Restrictions: No      Mobility  Bed Mobility Overal bed mobility: Needs Assistance Bed Mobility: Supine to Sit;Sit to Supine     Supine to sit: Mod assist;+2 for safety/equipment;HOB elevated Sit to supine: Min assist   General bed mobility comments: potentially able to perform better, getting him to understand what we wanted was half the difficulty. ModA+2 for bringing LEs off bed and trunk elevation. MinA to return to supine     Transfers Overall transfer level: Needs assistance Equipment used: 2 person hand held assist Transfers: Sit to/from Stand Sit to Stand: Mod assist;+2 physical assistance;+2 safety/equipment;From elevated surface;Min assist         General transfer comment: initially min A, maintains standing for approx 10 seconds, fatigued and was mod A by 3rd sit<>stand  Ambulation/Gait Ambulation/Gait assistance: Max assist;+2 physical assistance Gait Distance (Feet): 1 Feet Assistive device: 2 person hand held assist       General Gait Details: attempted side stepping but patient required assist to move R LE but able to step with L towards R  Stairs            Wheelchair Mobility    Modified Rankin (Stroke Patients Only) Modified Rankin (Stroke Patients Only) Pre-Morbid Rankin Score: Moderately severe disability Modified Rankin: Severe disability     Balance Overall balance assessment: Needs assistance Sitting-balance support: Single extremity supported;Feet unsupported Sitting balance-Leahy Scale: Fair     Standing balance support: Bilateral upper extremity supported Standing balance-Leahy Scale: Poor Standing balance comment: requires BUE support                             Pertinent Vitals/Pain Pain Assessment: No/denies pain Pain Intervention(s): Monitored during session    Home Living Family/patient expects to be discharged to:: Private residence Living Arrangements: Children;Spouse/significant other Available Help at Discharge: Family;Available 24 hours/day Type of Home: House Home Access: Stairs to enter     Home Layout: One level Home Equipment: Mohab Ashby - 2 wheels Additional Comments: gleaned from previous admission.    Prior Function Level of Independence: Needs assistance   Gait / Transfers Assistance Needed: use of Elwin Tsou. reports walking is very limited  to approx 10 feet in home.  ADL's / Homemaking Assistance Needed: has assistance with  ADLs. Reports he can walk to the bathroom.  Comments: gleaned from previous admission. no family present and Pt unable/did not respond to questions     Hand Dominance   Dominant Hand: Right    Extremity/Trunk Assessment   Upper Extremity Assessment Upper Extremity Assessment: Defer to OT evaluation RUE Deficits / Details: during MMT, able to form weak grasp, during functional tasks weak and uncoordinated RUE Coordination: decreased gross motor;decreased fine motor    Lower Extremity Assessment Lower Extremity Assessment: RLE deficits/detail RLE Deficits / Details: Difficult to assess due to language barrier but patient able to perform LAQs seated EOB and minimal seated marching    Cervical / Trunk Assessment Cervical / Trunk Assessment: Normal  Communication   Communication: Prefers language other than [place];Interpreter utilized  Cognition Arousal/Alertness: Awake/alert Behavior During Therapy: WFL for tasks assessed/performed Overall Cognitive Status: Difficult to assess Area of Impairment: Following commands;Problem solving                       Following Commands: Follows one step commands inconsistently     Problem Solving: Slow processing;Decreased initiation;Requires verbal cues;Requires tactile cues General Comments: will continue to require evaluation - family prefferred if possible      General Comments      Exercises     Assessment/Plan    PT Assessment Patient needs continued PT services  PT Problem List Decreased strength;Decreased balance;Decreased cognition;Decreased knowledge of precautions;Decreased mobility;Decreased knowledge of use of DME;Decreased activity tolerance       PT Treatment Interventions DME instruction;Therapeutic activities;Cognitive remediation;Gait training;Therapeutic exercise;Patient/family education;Functional mobility training    PT Goals (Current goals can be found in the Care Plan section)  Acute Rehab PT  Goals Patient Stated Goal: did not state PT Goal Formulation: Patient unable to participate in goal setting Time For Goal Achievement: 03/28/21 Potential to Achieve Goals: Fair    Frequency Min 4X/week   Barriers to discharge        Co-evaluation PT/OT/SLP Co-Evaluation/Treatment: Yes Reason for Co-Treatment: Complexity of the patient's impairments (multi-system involvement);Necessary to address cognition/behavior during functional activity;To address functional/ADL transfers;For patient/therapist safety PT goals addressed during session: Mobility/safety with mobility;Balance;Strengthening/ROM OT goals addressed during session: ADL's and self-care;Proper use of Adaptive equipment and DME;Strengthening/ROM       AM-PAC PT "6 Clicks" Mobility  Outcome Measure Help needed turning from your back to your side while in a flat bed without using bedrails?: Total Help needed moving from lying on your back to sitting on the side of a flat bed without using bedrails?: Total Help needed moving to and from a bed to a chair (including a wheelchair)?: Total Help needed standing up from a chair using your arms (e.g., wheelchair or bedside chair)?: Total Help needed to walk in hospital room?: Total Help needed climbing 3-5 steps with a railing? : Total 6 Click Score: 6    End of Session Equipment Utilized During Treatment: Gait belt Activity Tolerance: Patient tolerated treatment well Patient left: in bed;with call bell/phone within reach;with bed alarm set Nurse Communication: Mobility status PT Visit Diagnosis: Unsteadiness on feet (R26.81);Muscle weakness (generalized) (M62.81);Difficulty in walking, not elsewhere classified (R26.2);Other symptoms and signs involving the nervous system (R29.898)    Time: [id number] PT Time Calculation (min) (ACUTE ONLY): 20 min   Charges:   PT Evaluation $PT Eval Moderate Complexity: 1 Mod  Jamya Starry A. Gilford Rile PT, DPT Acute Rehabilitation  Services Pager [phone] Office [phone]   Linna Hoff 03/14/2021, 5:07 PM

## 2021-03-14 NOTE — Progress Notes (Signed)
OT Cancellation Note  Patient Details Name: Logan [name] MRN: [id number] DOB: 1935/04/03   Cancelled Treatment:    Reason Eval/Treat Not Completed: Active bedrest order (until 03/14/21 at 23:22)  [place] 03/14/2021, 7:51 AM  Jesse Sans OTR/L Acute Rehabilitation Services Pager: [phone] Office: [phone]

## 2021-03-14 NOTE — Progress Notes (Signed)
Pt brought down to MRI for 2nd attempt via pt transport. Pt given meds prior to coming down by RN. Pt prepared for exam and began scanning. Upon scanning, pt continuously moved head back and forth and continuously moved legs. Contacted RN about possibility of further meds. RN unable to provide meds at this time. Unable to obtain diagnostic images in pt's current state due to significant motion. Pt sent back to room via pt transport.

## 2021-03-14 NOTE — Progress Notes (Addendum)
STROKE TEAM PROGRESS NOTE   INTERVAL HISTORY No acute events overnight. Cardene drip off since this am. Wife and daughter at bedside. Speaking [place] to patient. He is responding in both [place] and his native language. At his baseline he understands [place] and speaks some basic [place].  Aphasia with perseveration per family description today. They report he is confused and does not  know family consistently. .  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 Then when recurred and appeared more like vomiting along with mental status decline he was brought to [name]. He had refused home medications x 3 days. BP high at home recently, refusing to take medications for past 3 days. Had been taking liquids and soft foods well at home in the setting of esophagitis.  We discussed stroke diagnosis, ongoing work up and plan of care. Questions were answered.    Vitals:   03/14/21 1400 03/14/21 1417 03/14/21 1500 03/14/21 1556  BP: 138/81  (!) 148/88 (!) 138/101  Pulse: 97 97 95 (!) 107  Resp: (!) 8 (!) 8 (!) 8 17  Temp:    97.8 F (36.6 C)  TempSrc:    Oral  SpO2:  97% 98% 98%   CBC:  Recent Labs  Lab 03/13/21 1610 03/14/21 0832  WBC 11.1* 10.1  NEUTROABS 8.0*  --   HGB 12.6* 11.6*  HCT 39.6 36.6*  MCV 80.5 79.6*  PLT 343 [id number]   Basic Metabolic Panel:  Recent Labs  Lab 03/13/21 1610 03/14/21 0832  NA 140 139  K 3.8 3.8  CL 108 107  CO2 26 20*  GLUCOSE 110* 95  BUN 15 18  CREATININE 1.91* 2.21*  CALCIUM 8.0* 7.8*   Lipid Panel:  Recent Labs  Lab 03/14/21 0832  CHOL 260*  TRIG 113  HDL 89  CHOLHDL 2.9  VLDL 23  LDLCALC 148*   HgbA1c:  Recent Labs  Lab 03/14/21 0832  HGBA1C 5.3   Urine Drug Screen: No results for input(s): LABOPIA, COCAINSCRNUR, LABBENZ, AMPHETMU, THCU, LABBARB in the last 168 hours.  Alcohol Level No results for input(s): ETH in the last 168 hours.  IMAGING past 24 hours CT HEAD WO  CONTRAST (5MM)  Result Date: 03/13/2021 CLINICAL DATA:  Hemorrhage follow-up EXAM: CT HEAD WITHOUT CONTRAST TECHNIQUE: Contiguous axial images were obtained from the base of the skull through the vertex without intravenous contrast. COMPARISON:  03/13/2021 FINDINGS: Brain: Unchanged focus of intraparenchymal hemorrhage centered in the left basal ganglia with surrounding edema. There is periventricular hypoattenuation compatible with chronic microvascular disease. No midline shift or other mass effect. Vascular: No abnormal hyperdensity of the major intracranial arteries or dural venous sinuses. No intracranial atherosclerosis. Skull: The visualized skull base, calvarium and extracranial soft tissues are normal. Sinuses/Orbits: No fluid levels or advanced mucosal thickening of the visualized paranasal sinuses. No mastoid or middle ear effusion. The orbits are normal. IMPRESSION: Unchanged focus of intraparenchymal hemorrhage centered in the left basal ganglia with surrounding edema. Electronically Signed   By: Ulyses Jarred M.D.   On: 03/13/2021 21:43   ECHOCARDIOGRAM COMPLETE  Result Date: 03/14/2021    ECHOCARDIOGRAM REPORT   Patient Name:   [name] Date of Exam: 03/14/2021 Medical Rec #:  [id number]     Height:       69.0 in Accession #:    [id number]    Weight:       141.1 lb Date of Birth:  January 29, 1935    BSA:  1.781 m Patient Age:    48 years      BP:           125/98 mmHg Patient Gender: M             HR:           110 bpm. Exam Location:  Inpatient Procedure: 2D Echo, Cardiac Doppler and Color Doppler Indications:    Stroke I63.9  History:        Patient has prior history of Echocardiogram examinations, most                 recent 03/05/2017. CHF, Stroke; Risk Factors:Non-Smoker and                 Hypertension. SVT.  Sonographer:    Leavy Cella RDCS Referring Phys: [phone] MCNEILL P KIRKPATRICK  Sonographer Comments: Technically difficult study due to poor echo windows. IMPRESSIONS  1. Left  ventricular ejection fraction, by estimation, is 70 to 75%. The left ventricle has hyperdynamic function. The left ventricle has no regional wall motion abnormalities. There is mild asymmetric left ventricular hypertrophy of the basal-septal segment. Indeterminate diastolic filling due to E-A fusion.  2. Right ventricular systolic function is normal. The right ventricular size is normal. There is mildly elevated pulmonary artery systolic pressure. The estimated right ventricular systolic pressure is 123456 mmHg.  3. A small pericardial effusion is present. The pericardial effusion is circumferential.  4. The mitral valve is grossly normal. No evidence of mitral valve regurgitation. No evidence of mitral stenosis.  5. Aortic regurgitation appears trivial. Color scale inappropriately adjusted during this study. The aortic valve is tricuspid. There is mild calcification of the aortic valve. Aortic valve regurgitation is trivial. Mild to moderate aortic valve sclerosis/calcification is present, without any evidence of aortic stenosis.  6. The inferior vena cava is normal in size with greater than 50% respiratory variability, suggesting right atrial pressure of 3 mmHg. Conclusion(s)/Recommendation(s): No intracardiac source of embolism detected on this transthoracic study. A transesophageal echocardiogram is recommended to exclude cardiac source of embolism if clinically indicated. FINDINGS  Left Ventricle: Left ventricular ejection fraction, by estimation, is 70 to 75%. The left ventricle has hyperdynamic function. The left ventricle has no regional wall motion abnormalities. The left ventricular internal cavity size was normal in size. There is mild asymmetric left ventricular hypertrophy of the basal-septal segment. Indeterminate diastolic filling due to E-A fusion. Right Ventricle: The right ventricular size is normal. No increase in right ventricular wall thickness. Right ventricular systolic function is normal. There  is mildly elevated pulmonary artery systolic pressure. The tricuspid regurgitant velocity is 2.94  m/s, and with an assumed right atrial pressure of 3 mmHg, the estimated right ventricular systolic pressure is 123456 mmHg. Left Atrium: Left atrial size was normal in size. Right Atrium: Right atrial size was normal in size. Pericardium: A small pericardial effusion is present. The pericardial effusion is circumferential. Mitral Valve: The mitral valve is grossly normal. No evidence of mitral valve regurgitation. No evidence of mitral valve stenosis. Tricuspid Valve: The tricuspid valve is grossly normal. Tricuspid valve regurgitation is trivial. No evidence of tricuspid stenosis. Aortic Valve: Aortic regurgitation appears trivial. Color scale inappropriately adjusted during this study. The aortic valve is tricuspid. There is mild calcification of the aortic valve. Aortic valve regurgitation is trivial. Aortic regurgitation PHT measures 449 msec. Mild to moderate aortic valve sclerosis/calcification is present, without any evidence of aortic stenosis. Pulmonic Valve: The pulmonic valve was not well  visualized. Pulmonic valve regurgitation is not visualized. Aorta: The aortic root was not well visualized and the ascending aorta was not well visualized. Venous: The inferior vena cava is normal in size with greater than 50% respiratory variability, suggesting right atrial pressure of 3 mmHg. IAS/Shunts: The atrial septum is grossly normal.  LEFT VENTRICLE PLAX 2D LVIDd:         3.90 cm LVIDs:         2.50 cm LV PW:         1.00 cm LV IVS:        1.30 cm LVOT diam:     2.10 cm LVOT Area:     3.46 cm  RIGHT VENTRICLE RV S prime:     12.50 cm/s TAPSE (M-mode): 1.0 cm LEFT ATRIUM             Index       RIGHT ATRIUM          Index LA diam:        3.20 cm 1.80 cm/m  RA Area:     7.58 cm LA Vol (A2C):   25.5 ml 14.32 ml/m RA Volume:   13.60 ml 7.63 ml/m LA Vol (A4C):   26.1 ml 14.65 ml/m LA Biplane Vol: 26.8 ml 15.05 ml/m   AORTIC VALVE AI PHT:      449 msec MITRAL VALVE                TRICUSPID VALVE MV Area (PHT): 5.42 cm     TR Peak grad:   34.6 mmHg MV Decel Time: 140 msec     TR Vmax:        294.00 cm/s MV E velocity: 110.00 cm/s                             SHUNTS                             Systemic Diam: 2.10 cm Eleonore Chiquito MD Electronically signed by Eleonore Chiquito MD Signature Date/Time: 03/14/2021/12:40:04 PM    Final     PHYSICAL EXAM  Temp:  [97.2 F (36.2 C)-97.8 F (36.6 C)] 97.8 F (36.6 C) (09/17 1556) Pulse Rate:  [71-109] 107 (09/17 1556) Resp:  [6-22] 17 (09/17 1556) BP: (100-208)/(67-155) 138/101 (09/17 1556) SpO2:  [90 %-100 %] 98 % (09/17 1556)  General - Well developed thin male sitting up in bed in NAD.   Ophthalmologic - fundi not visualized due to noncooperation.  Cardiovascular - Regular rhythm and rate showing on monitor.  Mental Status -  Alert, oriented to name, age. Disoriented to person, place, time and situation. Follows some one step commands but not consistently. Mimics more than follows. Dose not cooperate with much of the exam.  Perseverating and dysarthric. Does not name or repeat. Comprehension impaired.  Attention span and concentration were impaired. Recent and remote memory were not able to be assessed.  Fund of Knowledge was not assessed due to inability to focus and cooperate.  Cranial Nerves II - XII - II - Visual field testing impaired by inability to participate/follow commands. He tracked examiners/family on both sides of bed.  III, IV, VI - Extraocular movements intact. No ptosis.  V - Unable to assess Facial sensation VII - Facial movement intact bilaterally VIII - Hearing grossly intact bilaterally. X - Palate elevates symmetrically. XI - Unable to assess  Chin turning & shoulder shrug intact bilaterally. XII - Tongue protrusion intact.  Motor Strength - Right sided weakness RUE and RLE, Antigravity x 4 extremities.  Motor Tone - Muscle tone was  assessed at the neck and appendages and was normal.  Sensory -Unable to assess   Coordination - Not following commands for testing. Tremor was present (mild in LUE).  Gait and Station - deferred.   ASSESSMENT/PLAN 86 year old [place] speaking male with history of previous stroke with right hemiparesis who presents with what is likely a hypertensive subcortical hemorrhage.  He has fairly dementia, but family has indicated a willingness to pursue feeding tubes and he is full code as of his most recent discharge.  He will need aggressive blood pressure control   Small subacute subcortical left sided hemorrhage likely due to severe hypertension possibly not reliably taking po medications due to pain in the setting of herpes esophagitis  Code Stroke HCT Subcortical hemorrhage on the left Follow up HCT Unchanged focus of intraparenchymal hemorrhage centered in the left basal ganglia with surrounding edema MRI  Aborted due to claustrophobia, will try again with premed of Ativan per discussion with Dr. Orvilla Cornwall Transfer out of ICU if imaging stable and transfer to hospitalist team  2D Echo  EF 70-75%, left ventricle hyperdynamic functioning, mild asymmetric LVH, mildly elevated pulmonary artery systolic, A small pericardial effusion is present, No thrombus, wall motion abnormality or shunt found.   pressure. T LDL 148 HgbA1c 5.3 VTE prophylaxis -     Diet   Diet full liquid Room service appropriate? Yes; Fluid consistency: Thin   HOLD all anticoagulant and antiplatelets If symptoms worsen or there is decreased mental status, repeat stat head CT Therapy recommendations:  CIR/pending Disposition: TBD  Hypertension Home meds:  Norvasc '10mg'$ , Coreg 12.5 mg BP control with systolic goal of 0000000 Requiring cardene infusion, attempt to wean Add oral home agents as able  Long-term BP goal normotensive  Hyperlipidemia Home meds:  Lipitor '20mg'$   LDL 148, goal < 70 High intensity statin  to considered upon discharge  Continue statin at discharge       Herpes Esophagitis      Feeding/Nutrition Hospitalized 9/1-9/6 for failure to thrive attributed to herpes esophagitis (Recent admission for abdominal pain and upper GI endoscopy consistent with esophagitis with HSV on biopsy) On Zovirax 9/6-9/16 per chart review Continue protonix  Tolerating po meds and full liquid diet Weight to obtained by nursing  RD consult placed to assist with maximizing nutrition      Other Stroke Risk Factors Advanced Age >/= 44  Hx stroke 2017 in [place] with right sided deficits  Other [name] day # 1  ATTENDING NOTE: I reviewed above note and agree with the assessment and plan. Pt was seen and examined.   86 year old male with history of recent herpes encephalitis, HLD, strokes x 4 with residual right-sided weakness, dementia admitted for vomiting twice in two days with elevated BP for the last week.  CT head showed left BG subacute ICH, repeat CT stable.  MRI and MRA head pending, carotid Doppler pending.  EF 70 to 75%.  A1c 5.3, LDL 148.  Creatinine 2.20-1.91.  WBC 11.1.  On exam, daughter in law and wife are at bedside.  Patient awake, alert, eyes open, orientated to age, place, but not to time or people. Severe dysarthria, able to repeat words but not sentences, not able to name. Inconsistently following simple commands, but able to mimic most commands.  No gaze palsy, tracking bilaterally, blinking to visual threat bilaterally. Mild right facial droop. Tongue midline not cooperative. LUE and LLE at least 4/5, RUE and RLE 3+/5. Sensation exam not cooperative, b/l FTN grossly intact with mild postural tremor on th right, gait not tested.   Patient passed swallow, currently on diet, given recent herpes encephalitis, will start full liquid diet.  Also started home BP meds, off Cardene at this time.  BP goal less than 160.  DC IV fluid.  Hold off statin in the setting of  acute ICH, will restart on discharge.  PT/OT recommend CIR.  For detailed assessment and plan, please refer to above as I have made changes wherever appropriate.   Rosalin Hawking, MD PhD Stroke Neurology 03/14/2021 7:15 PM    This plan of care was directed by Dr. Erlinda Hong To contact Stroke Continuity provider, please refer to [email or web address] After hours, contact General Neurology

## 2021-03-14 NOTE — Progress Notes (Signed)
Patient transferred to 3W at this time without complication. All belongings taken. Family called and aware of transfer. Patient in NAD, VSS.

## 2021-03-15 ENCOUNTER — Inpatient Hospital Stay (HOSPITAL_COMMUNITY): Payer: 59

## 2021-03-15 DIAGNOSIS — I619 Nontraumatic intracerebral hemorrhage, unspecified: Secondary | ICD-10-CM

## 2021-03-15 DIAGNOSIS — I129 Hypertensive chronic kidney disease with stage 1 through stage 4 chronic kidney disease, or unspecified chronic kidney disease: Secondary | ICD-10-CM

## 2021-03-15 DIAGNOSIS — N184 Chronic kidney disease, stage 4 (severe): Secondary | ICD-10-CM

## 2021-03-15 DIAGNOSIS — I10 Essential (primary) hypertension: Secondary | ICD-10-CM

## 2021-03-15 DIAGNOSIS — I674 Hypertensive encephalopathy: Secondary | ICD-10-CM

## 2021-03-15 LAB — CBC
HCT: 33.4 % — ABNORMAL LOW (ref 39.0–52.0)
Hemoglobin: 11 g/dL — ABNORMAL LOW (ref 13.0–17.0)
MCH: 25.7 pg — ABNORMAL LOW (ref 26.0–34.0)
MCHC: 32.9 g/dL (ref 30.0–36.0)
MCV: 78 fL — ABNORMAL LOW (ref 80.0–100.0)
Platelets: 296 10*3/uL (ref 150–400)
RBC: 4.28 MIL/uL (ref 4.22–5.81)
RDW: 25.3 % — ABNORMAL HIGH (ref 11.5–15.5)
WBC: 10.4 10*3/uL (ref 4.0–10.5)
nRBC: 0 % (ref 0.0–0.2)

## 2021-03-15 LAB — BASIC METABOLIC PANEL
Anion gap: 7 (ref 5–15)
BUN: 20 mg/dL (ref 8–23)
CO2: 23 mmol/L (ref 22–32)
Calcium: 7.7 mg/dL — ABNORMAL LOW (ref 8.9–10.3)
Chloride: 109 mmol/L (ref 98–111)
Creatinine, Ser: 2.17 mg/dL — ABNORMAL HIGH (ref 0.61–1.24)
GFR, Estimated: 29 mL/min — ABNORMAL LOW (ref >60)
Glucose, Bld: 83 mg/dL (ref 70–99)
Potassium: 3.8 mmol/L (ref 3.5–5.1)
Sodium: 139 mmol/L (ref 135–145)

## 2021-03-15 MED ORDER — CARVEDILOL 12.5 MG PO TABS
25.0000 mg | ORAL_TABLET | Freq: Two times a day (BID) | ORAL | Status: DC
  Administered 2021-03-15 – 2021-03-19 (×8): 25 mg via ORAL
  Filled 2021-03-15 (×8): qty 2

## 2021-03-15 MED ORDER — CHLORHEXIDINE GLUCONATE CLOTH 2 % EX PADS
6.0000 | MEDICATED_PAD | Freq: Every day | CUTANEOUS | Status: DC
  Administered 2021-03-16: 6 via TOPICAL

## 2021-03-15 MED ORDER — LABETALOL HCL 5 MG/ML IV SOLN
10.0000 mg | INTRAVENOUS | Status: DC | PRN
  Filled 2021-03-15: qty 4

## 2021-03-15 MED ORDER — PANTOPRAZOLE 2 MG/ML SUSPENSION
40.0000 mg | Freq: Two times a day (BID) | ORAL | Status: DC
  Administered 2021-03-15 – 2021-03-19 (×9): 40 mg via ORAL
  Filled 2021-03-15 (×6): qty 20

## 2021-03-15 NOTE — Progress Notes (Signed)
Carotid duplex has been completed.   Preliminary results in CV Proc.   [name] 03/15/2021 11:08 AM

## 2021-03-15 NOTE — Progress Notes (Deleted)
Inpatient Rehab Admissions:  Inpatient Rehab Consult received.  I met with patient,  Abigail Butts (wife) and Kyra Searles (sister) at the bedside for rehabilitation assessment and to discuss goals and expectations of an inpatient rehab admission.  Pt was asleep so spoke with wife and sister. Both acknowledged understanding of CIR goals and expectations. Both are interested in pt pursuing CIR. Will continue to follow medical work and progress with therapies.  Signed: Gayland Curry, [place], [place] Admissions Coordinator [phone]

## 2021-03-15 NOTE — Progress Notes (Signed)
Attempted to perform neuro assessment with the help of the interpreter on the phone line, as a live online interpreter was not available for his language.  Interpreter said he could not understand pt and that pt is not following his commands so I did have him to do some of the assessment by mimicking what I was doing and he did do that for some parts of the assessment but did nothing for other parts. Was going to call his daughter but it was too late and since he did not appear to be in distress, I decided to wait and have the day nurse to pass it on, as MD assessment did line up with what I found on my assessment.

## 2021-03-15 NOTE — Evaluation (Signed)
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   Failure to thrive in adult 02/26/2021   Proteinuria 02/12/2021   Intractable abdominal pain 02/11/2021   AKI (acute kidney injury) ([place]) 02/11/2021   Anemia 02/11/2021   CKD (chronic kidney disease), stage III ([place]) 02/11/2021   Benign essential HTN 02/11/2021   Acute on chronic renal insufficiency    Hypoalbuminemia    Acute systolic CHF (congestive heart failure) (HCC)    S/P total right hip arthroplasty 04/30/2020   Abnormal CT scan, esophagus    Acute upper GI bleed 05/06/2017   Hyperkalemia 05/06/2017   Malnutrition of moderate degree 04/13/2017   Gastroesophageal reflux disease with esophagitis    Hyperglycemia 04/11/2017   Malnutrition of mild degree ([place]) 04/11/2017   Intractable vomiting with nausea    Physical deconditioning    Iron deficiency anemia    Upper GI bleed    Esophageal ulcer with bleeding    Cardiomegaly    Chronic diastolic CHF (congestive heart failure) (HCC)    Intractable hiccups    Cerebrovascular accident (CVA) ([place])    SVT (supraventricular tachycardia) ([place])    Hematemesis 03/03/2017   Past Medical History:  Past Medical History:  Diagnosis Date   Anemia    Cataract    Esophageal ulcer    Gastritis    proximal   Hiatal hernia    Hyperlipidemia    Intractable hiccups    Reflux esophagitis    Stroke ([place])    r side deficits; September 2017   Upper GI bleed    Past Surgical History:  Past Surgical History:  Procedure Laterality Date   BIOPSY  02/11/2021   Procedure: BIOPSY;  Surgeon: Clarene [facility], MD;  Location: [facility] ENDOSCOPY;   Service: Endoscopy;;   COLONOSCOPY     ESOPHAGOGASTRODUODENOSCOPY     ESOPHAGOGASTRODUODENOSCOPY N/A 03/04/2017   Procedure: ESOPHAGOGASTRODUODENOSCOPY (EGD);  Surgeon: Mauri Pole, MD;  Location: [facility] ENDOSCOPY;  Service: Endoscopy;  Laterality: N/A;   ESOPHAGOGASTRODUODENOSCOPY (EGD) WITH PROPOFOL N/A 02/11/2021   Procedure: ESOPHAGOGASTRODUODENOSCOPY (EGD) WITH PROPOFOL;  Surgeon: Clarene Essex, MD;  Location: [facility] ENDOSCOPY;  Service: Endoscopy;  Laterality: N/A;   UPPER GASTROINTESTINAL ENDOSCOPY     HPI:  Patient is an 86 y.o. male admitted 03/13/21 with vomiting blood, elevated BP, increased weakness of R side. CT revealed basal ganglia hemorrhage. PMh includes: dementia, herpes esophagitis, hyperlipidema, previous CVA with R sided weakness. He was recently admitted 9/2-03/03/2021 with epigastric abdominal pain, nausea, vomiting, FTT and admitted with CHF and acute on chronic kidney injury.   Assessment / Plan / Recommendation Clinical Impression  Speech-language and cognitive evaluation completed but impacted significantly by patient's state of lethargy, requiring modA, at times mod-max A to initaite and maintain arousal and attention. He also appeared distracted. SLP utilized audio interpreter (language: [place]) during evaluation and in additoin, patient did follow a couple one step directions in English (point to nose, open mouth). Patient did appear with suspected dysarthria and per interpreter, his speech was very difficult to understand. Interpreter was only able to understand a few one-word responses. No family at bedside. Patient's ability to attend and perform basic tasks was moderate to severely impaired at this time. If possible,  he woudl benefit from a live interpreter or at least a video interpreter. SLP will follow patient for cognitive-linguistic and speech impairments.    SLP Assessment  SLP Recommendation/Assessment: Patient needs continued Speech Lanaguage Pathology  Services SLP Visit Diagnosis: Dysarthria and anarthria (R47.1);Cognitive communication deficit (R41.841);Attention and concentration deficit Attention and concentration deficit following: Cerebral infarction    Recommendations for follow up therapy are one component of a multi-disciplinary discharge planning process, led by the attending physician.  Recommendations may be updated based on patient status, additional functional criteria and insurance authorization.    Follow Up Recommendations  24 hour supervision/assistance;Skilled Nursing facility;Inpatient Rehab    Frequency and Duration min 2x/week  1 week      SLP Evaluation Cognition  Overall Cognitive Status: Difficult to assess Orientation Level: Oriented to person;Disoriented to time;Disoriented to situation;Disoriented to place Attention: Focused Focused Attention: Impaired Focused Attention Impairment: Verbal basic;Functional basic Memory:  (UTA) Awareness: Impaired Awareness Impairment: Intellectual impairment Problem Solving: Impaired Problem Solving Impairment: Functional basic Behaviors: Restless Safety/Judgment: Impaired       Comprehension  Auditory Comprehension Overall Auditory Comprehension: Impaired Yes/No Questions: Impaired Basic Biographical Questions: 26-50% accurate Commands: Impaired One Step Basic Commands: 25-49% accurate Interfering Components: Attention;Processing speed EffectiveTechniques: Repetition;Stressing words;Extra processing time Reading Comprehension Reading Status: Not tested    Expression Expression Primary Mode of Expression: Verbal Verbal Expression Overall Verbal Expression: Impaired Initiation: Impaired Level of Generative/Spontaneous Verbalization: Word;Phrase Naming: Not tested Pragmatics: Impairment Impairments: Abnormal affect;Eye contact Interfering Components: Attention;Speech intelligibility Non-Verbal Means of Communication: Not applicable Written  Expression Dominant Hand: Right Written Expression: Not tested   Oral / Motor  Oral Motor/Sensory Function Overall Oral Motor/Sensory Function: Other (comment) (appears WFL but patient not able to follow directions for full oral motor exam) Motor Speech Articulation: Impaired Intelligibility: Intelligibility reduced Word: 25-49% accurate Phrase: 0-24% accurate Sentence: 0-24% accurate Conversation: Not tested   [place], MA, CCC-SLP Speech Therapy [facility] Acute Rehab

## 2021-03-15 NOTE — Progress Notes (Addendum)
STROKE TEAM PROGRESS NOTE   INTERVAL HISTORY Wife and daughter at bedside. They are reporting that patient's slurring of speech is worse today. They are not able to understand most of what he attempts to say. He has been sleeping most of the time since they arrived this morning. He is keeping his eyes closed and will not focus on or track examiner. He focuses on daughter in law for brief periods but will not follow my interpreted commands despite multiple family member's encouragement. He does not attempt to state his name or age or family member's names when asked. Dr. Erlinda Hong notified. Will repeat HCT stat.  Nurse Lockie Pares reports patient pulling off monitors last night and got haldol around midnight.    Vitals:   03/15/21 0410 03/15/21 0430 03/15/21 0730 03/15/21 0844  BP: (!) 160/90 (!) 152/72 (!) 168/100 (!) 160/95  Pulse: 86  (!) 102   Resp: 18  16   Temp: 98.1 F (36.7 C)  98.5 F (36.9 C)   TempSrc:   Axillary   SpO2: 99%  100%    CBC:  Recent Labs  Lab 03/13/21 1610 03/14/21 0832 03/15/21 0156  WBC 11.1* 10.1 10.4  NEUTROABS 8.0*  --   --   HGB 12.6* 11.6* 11.0*  HCT 39.6 36.6* 33.4*  MCV 80.5 79.6* 78.0*  PLT 343 337 0000000   Basic Metabolic Panel:  Recent Labs  Lab 03/14/21 0832 03/15/21 0156  NA 139 139  K 3.8 3.8  CL 107 109  CO2 20* 23  GLUCOSE 95 83  BUN 18 20  CREATININE 2.21* 2.17*  CALCIUM 7.8* 7.7*   Lipid Panel:  Recent Labs  Lab 03/14/21 0832  CHOL 260*  TRIG 113  HDL 89  CHOLHDL 2.9  VLDL 23  LDLCALC 148*   HgbA1c:  Recent Labs  Lab 03/14/21 0832  HGBA1C 5.3   Urine Drug Screen: No results for input(s): LABOPIA, COCAINSCRNUR, LABBENZ, AMPHETMU, THCU, LABBARB in the last 168 hours.  Alcohol Level No results for input(s): ETH in the last 168 hours.  IMAGING past 24 hours VAS US CAROTID  Result Date: 03/15/2021 Carotid Arterial Duplex Study Patient Name:  Logan [name]  Date of Exam:   03/15/2021 Medical Rec #: [id number]      Accession #:     [id number] Date of Birth: 02-11-1935     Patient Gender: M Patient Age:   86 years Exam Location:  [facility] Procedure:      VAS US CAROTID Referring Phys: Cornelius Moras Austin Herd --------------------------------------------------------------------------------  Indications:       CVA. Risk Factors:      Hyperlipidemia, Diabetes, prior CVA. Limitations        Today's exam was limited due to the patient's inability or                    unwillingness to cooperate. Comparison Study:  no prior Performing Technologist: Archie Patten RVS  Examination Guidelines: A complete evaluation includes B-mode imaging, spectral Doppler, color Doppler, and power Doppler as needed of all accessible portions of each vessel. Bilateral testing is considered an integral part of a complete examination. Limited examinations for reoccurring indications may be performed as noted.  Right Carotid Findings: +----------+--------+--------+--------+------------------+--------+           PSV cm/sEDV cm/sStenosisPlaque DescriptionComments +----------+--------+--------+--------+------------------+--------+ CCA Prox  68      11              heterogenous               +----------+--------+--------+--------+------------------+--------+  CCA Distal69      14              heterogenous               +----------+--------+--------+--------+------------------+--------+ ICA Prox  50      15      1-39%   heterogenous               +----------+--------+--------+--------+------------------+--------+ ICA Distal38      9                                          +----------+--------+--------+--------+------------------+--------+ ECA       76                                                 +----------+--------+--------+--------+------------------+--------+ +----------+--------+-------+--------+-------------------+           PSV cm/sEDV cmsDescribeArm Pressure (mmHG)  +----------+--------+-------+--------+-------------------+ Subclavian70                                         +----------+--------+-------+--------+-------------------+ +---------+--------+--+--------+--+---------+ VertebralPSV cm/s42EDV cm/s14Antegrade +---------+--------+--+--------+--+---------+  Left Carotid Findings: +----------+--------+--------+--------+------------------+--------+           PSV cm/sEDV cm/sStenosisPlaque DescriptionComments +----------+--------+--------+--------+------------------+--------+ CCA Prox  42      6               heterogenous               +----------+--------+--------+--------+------------------+--------+ CCA Distal48      16              heterogenous               +----------+--------+--------+--------+------------------+--------+ ICA Prox  51      16      1-39%   heterogenous               +----------+--------+--------+--------+------------------+--------+ ICA Distal36      8                                          +----------+--------+--------+--------+------------------+--------+ ECA       52                                                 +----------+--------+--------+--------+------------------+--------+ +----------+--------+--------+--------+-------------------+           PSV cm/sEDV cm/sDescribeArm Pressure (mmHG) +----------+--------+--------+--------+-------------------+ [id number]                                          +----------+--------+--------+--------+-------------------+ +---------+--------+--+--------+--+---------+ VertebralPSV cm/s44EDV cm/s12Antegrade +---------+--------+--+--------+--+---------+   Summary: Right Carotid: Velocities in the right ICA are consistent with a 1-39% stenosis. Left Carotid: Velocities in the left ICA are consistent with a 1-39% stenosis. Vertebrals: Bilateral vertebral arteries demonstrate antegrade flow. *See table(s) above for measurements and  observations.     Preliminary     PHYSICAL  EXAM  Temp:  [97.8 F (36.6 C)-98.8 F (37.1 C)] 98.5 F (36.9 C) (09/18 0730) Pulse Rate:  [86-107] 102 (09/18 0730) Resp:  [8-18] 16 (09/18 0730) BP: (138-168)/(72-101) 160/95 (09/18 0844) SpO2:  [97 %-100 %] 100 % (09/18 0730)  General - Well developed thin male lying in bed. Drowsy and restless.   Ophthalmologic - fundi not visualized due to noncooperation.  Cardiovascular - Regular rhythm and rate showing on monitor.  Mental Status -  Drowsy and restless. Not focusing on examiner, not answering questions, not following any  commands. Speech slurred (worse than yesterday).  Attention span and concentration were impaired. Recent and remote memory were not able to be assessed.  Fund of Knowledge was not assessed due to inability to focus and cooperate.  Cranial Nerves II - XII - II - Visual field testing impaired by inability to participate/follow commands.  III, IV, VI - Extraocular movements intact. No ptosis.  V - Unable to assess facial sensation. VII - Facial movement intact bilaterally VIII - Hearing grossly intact bilaterally. X - Palate elevates symmetrically. XI - Unable to assess Chin turning & shoulder shrug intact bilaterally. XII - Tongue protrusion intact.  Motor Strength - Right sided weakness RUE and RLE, Antigravity x 4 extremities. Moving left side less today.  Motor Tone - Muscle tone was assessed at the neck and appendages and was normal.  Sensory -Unable to assess   Coordination - Not following commands for testing. Tremor was present (mild in LUE).  Gait and Station - deferred.   ASSESSMENT/PLAN 86 year old [place] speaking male with history of previous stroke with right hemiparesis who presents with what is likely a hypertensive subcortical hemorrhage.  He has fairly dementia, but family has indicated a willingness to pursue feeding tubes and he is full code as of his most recent discharge.  He will need  aggressive blood pressure control   Small subacute subcortical left sided hemorrhage likely due to severe hypertension possibly not reliably taking po medications due to pain in the setting of herpes esophagitis.   Code Stroke HCT  Subcortical hemorrhage on the left Follow up HCT 9/16 Unchanged focus of intraparenchymal hemorrhage centered in the left basal ganglia with surrounding edema MRI  Unable to obtain x 2 attempts on 9/17  (one with pre-medication with Ativan).    Stat HCT 9/17 for worsening of slurred speech and drowsiness as directed by Dr. Erlinda Hong  is PENDING 2D Echo  EF 70-75%, left ventricle hyperdynamic functioning, mild asymmetric LVH, mildly elevated pulmonary artery systolic, A small pericardial effusion is present, No thrombus, wall motion abnormality or shunt found.   pressure. T LDL 148 HgbA1c 5.3 VTE prophylaxis -     Diet   Diet full liquid Room service appropriate? Yes; Fluid consistency: Thin   HOLD all anticoagulant and antiplatelets If symptoms worsen or there is decreased mental status, repeat stat head CT Therapy recommendations:  CIR Disposition: TBD  Hypertension Home meds:  Norvasc '10mg'$ , Coreg 12.5 mg BP control with systolic goal of 0000000 Requiring cardene infusion, attempt to wean Add oral home agents as able  Long-term BP goal normotensive  Hyperlipidemia Home meds:  Lipitor '20mg'$   LDL 148, goal < 70 High intensity statin to considered upon discharge  Continue statin at discharge       Herpes Esophagitis      Feeding/Nutrition Hospitalized 9/1-9/6 for failure to thrive attributed to herpes esophagitis (Recent admission for abdominal pain and upper GI endoscopy consistent with esophagitis with HSV  on biopsy) On Zovirax 9/6-9/16 per chart review Continue protonix  SLP recommended dysphagia 3 diet. Tolerating po meds.  Weight to be obtained by nursing  RD consult placed to assist with maximizing nutrition   Other Stroke Risk Factors Advanced  Age >/= 66  Hx stroke 2017 in [place] with right sided deficits  Other Active Problems   Hospital day # 2  This plan of care was directed by Dr. Erlinda Hong. Charlene Brooke, NP-C  ATTENDING NOTE: I reviewed above note and agree with the assessment and plan. Pt was seen and examined.   No family at bedside.  Patient lying in bed, awake alert, able to tell me his name, place and age but with severe dysarthria.  Moving all extremities, still has mild weakness on the right.  Repeat CT head showed stable hematoma and surrounding edema.  BP fluctuate but under control with p.o. meds.  PT/OT recommend CIR.  For detailed assessment and plan, please refer to above as I have made changes wherever appropriate.   Neurology will sign off. Please call with questions. Pt will follow up with stroke clinic NP at [facility] in about 4 weeks. Thanks for the consult.   Rosalin Hawking, MD PhD Stroke Neurology 03/15/2021 7:11 PM   To contact Stroke Continuity provider, please refer to [email or web address] After hours, contact General Neurology

## 2021-03-15 NOTE — Evaluation (Signed)
Clinical/Bedside Swallow Evaluation Patient Details  Name: Logan [name] MRN: [id number] Date of Birth: Jun 06, 1935  Today's Date: 03/15/2021 Time: SLP Start Time (ACUTE ONLY): 1225 SLP Stop Time (ACUTE ONLY): 1245 SLP Time Calculation (min) (ACUTE ONLY): 20 min  Past Medical History:  Past Medical History:  Diagnosis Date   Anemia    Cataract    Esophageal ulcer    Gastritis    proximal   Hiatal hernia    Hyperlipidemia    Intractable hiccups    Reflux esophagitis    Stroke ([place])    r side deficits; September 2017   Upper GI bleed    Past Surgical History:  Past Surgical History:  Procedure Laterality Date   BIOPSY  02/11/2021   Procedure: BIOPSY;  Surgeon: Clarene Essex, MD;  Location: [facility] ENDOSCOPY;  Service: Endoscopy;;   COLONOSCOPY     ESOPHAGOGASTRODUODENOSCOPY     ESOPHAGOGASTRODUODENOSCOPY N/A 03/04/2017   Procedure: ESOPHAGOGASTRODUODENOSCOPY (EGD);  Surgeon: Mauri Pole, MD;  Location: [facility]COPY;  Service: Endoscopy;  Laterality: N/A;   ESOPHAGOGASTRODUODENOSCOPY (EGD) WITH PROPOFOL N/A 02/11/2021   Procedure: ESOPHAGOGASTRODUODENOSCOPY (EGD) WITH PROPOFOL;  Surgeon: Clarene Essex, MD;  Location: [facility] ENDOSCOPY;  Service: Endoscopy;  Laterality: N/A;   UPPER GASTROINTESTINAL ENDOSCOPY     HPI:  Patient is an 86 y.o. male admitted 03/13/21 with vomiting blood, elevated BP, increased weakness of R side. CT revealed basal ganglia hemorrhage. PMh includes: dementia, herpes esophagitis, hyperlipidema, previous CVA with R sided weakness. He was recently admitted 9/2-03/03/2021 with epigastric abdominal pain, nausea, vomiting, FTT and admitted with CHF and acute on chronic kidney injury.    Assessment / Plan / Recommendation  Clinical Impression  Patient presents with a mild oropharyngeal dysphagia. Oral phase impacted by patient having limited amount of dentition, however family reporting that he has never had dentures (has refused). He exhibited prolonged mastication and  oral transit with regular solid (graham cracker) but WFL for puree solids. SLP did suspect delayed swallow initiation with thin liquids but without overt s/s aspiration or penetration. Prior to, during and after PO intake, patient with intermittently congested sounding vocal quality. Per family, patient was eating softer solids at home. SLP is recommending to upgrade diet from full liquids to Dys 3 solids, thin liquids. SLP Visit Diagnosis: Dysphagia, unspecified (R13.10)    Aspiration Risk  Mild aspiration risk    Diet Recommendation Dysphagia 3 (Mech soft);Thin liquid   Liquid Administration via: Cup;Straw Medication Administration: Whole meds with puree Supervision: Staff to assist with self feeding;Full supervision/cueing for compensatory strategies;Comment (family ok to feed patient) Compensations: Minimize environmental distractions;Slow rate;Small sips/bites;Follow solids with liquid Postural Changes: Seated upright at 90 degrees;Remain upright for at least 30 minutes after po intake    Other  Recommendations Oral Care Recommendations: Oral care BID;Staff/trained caregiver to provide oral care    Recommendations for follow up therapy are one component of a multi-disciplinary discharge planning process, led by the attending physician.  Recommendations may be updated based on patient status, additional functional criteria and insurance authorization.  Follow up Recommendations 24 hour supervision/assistance;Skilled Nursing facility;Home health SLP      Frequency and Duration min 1 x/week  2 weeks       Prognosis Prognosis for Safe Diet Advancement: Good      Swallow Study   General Date of Onset: 02/27/21 HPI: Patient is an 86 y.o. male admitted 03/13/21 with vomiting blood, elevated BP, increased weakness of R side. CT revealed basal ganglia hemorrhage. PMh includes: dementia, herpes  esophagitis, hyperlipidema, previous CVA with R sided weakness. He was recently admitted  9/2-03/03/2021 with epigastric abdominal pain, nausea, vomiting, FTT and admitted with CHF and acute on chronic kidney injury. Type of Study: Bedside Swallow Evaluation Previous Swallow Assessment: during recent, previous admission 02/27/2021 Diet Prior to this Study: Thin liquids;Other (Comment) (full liquids) Temperature Spikes Noted: No Respiratory Status: Room air History of Recent Intubation: No Behavior/Cognition: Alert;Cooperative;Pleasant mood Oral Cavity Assessment: Within Functional Limits Oral Care Completed by SLP: No Oral Cavity - Dentition: Missing dentition Vision: Functional for self-feeding Self-Feeding Abilities: Needs assist;Total assist Patient Positioning: Upright in bed Baseline Vocal Quality: Normal;Other (comment) (mildly congested at times) Volitional Cough: Cognitively unable to elicit Volitional Swallow: Unable to elicit    Oral/Motor/Sensory Function Overall Oral Motor/Sensory Function: Mild impairment Facial ROM: Within Functional Limits Facial Symmetry: Within Functional Limits Facial Strength: Within Functional Limits Lingual Strength: Reduced   Ice Chips     Thin Liquid Thin Liquid: Impaired Presentation: Straw Oral Phase Impairments: Reduced labial seal Pharyngeal  Phase Impairments: Suspected delayed Swallow    Nectar Thick     Honey Thick     Puree Puree: Within functional limits Presentation: Spoon   Solid     Solid: Impaired Oral Phase Impairments: Impaired mastication;Reduced lingual movement/coordination Oral Phase Functional Implications: Prolonged oral transit Pharyngeal Phase Impairments: Suspected delayed Swallow      Sonia Baller, MA, CCC-SLP Speech Therapy

## 2021-03-15 NOTE — Progress Notes (Signed)
PROGRESS NOTE        PATIENT DETAILS Name: Rosetta Vieth Age: 86 y.o. Sex: male Date of Birth: 11/13/1934 Admit Date: 03/13/2021 Admitting Physician Greta Doom, MD [id number], Carloyn Manner, MD  Brief Narrative: Patient is a 86 y.o. 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  He was initially admitted by neurology to the [facility] subsequently transferred to [facility] on 9/18.  Subjective: Lying comfortably in bed-denies any chest pain or shortness of breath.  Appears pleasantly confused.  However is able to follow some simple commands.  Objective: Vitals: Blood pressure (!) 168/100, pulse (!) 102, temperature 98.5 F (36.9 C), temperature source Axillary, resp. rate 16, SpO2 100 %.   Exam: Gen Exam: Somewhat confused but following simple commands.  Not in any distress. HEENT:atraumatic, normocephalic Chest: B/L clear to auscultation anteriorly CVS:S1S2 regular Abdomen:soft non tender, non distended Extremities:no edema Neurology: LUE/LLE around 4/5, RUE/RLE around 3+/5 Skin: no rash  Pertinent Labs/Radiology: WBC: 10.4 Hb: 11.0 Creatinine: 2.17  Stroke work-up: LDL: 148 A1c: 5.3 9/16>> CT head: Left basal ganglia ICH 9/17>> Echo: EF 70-75%, RVSP 37.6  Assessment/Plan: Left basal ganglia ICH: Secondary to hypertension-optimize BP control-continue PT/OT-await further input from neurology.  Unable to tolerate MRI/MRA brain due to severe claustrophobia-even under IV Ativan.  History of prior CVA with chronic right-sided deficits  HTN: BP remains on the higher side-goal SBP less than 160.  Increase Coreg to 25 mg twice daily, continue amlodipine 10 mg daily.  Remains on labetalol as needed.  Reassess 9/19.  HLD: Plan is to resume Lipitor on discharge.  Recent history of  odynophagia due to herpes esophagitis: Completed a course of acyclovir.  Change PPI to twice daily-and slowly advance diet.  CKD stage IV: Creatinine close to baseline  Microcytic anemia: Appears to have iron deficiency-Per most recent work-up.  Remains on iron supplementation.  Follow CBC periodically.  History of dementia: Appears mildly confused-but language barrier-he is able to follow simple commands when spoken in Hindi.  Procedures :None Consults:Neuro DVT Prophylaxis: SCD's Code Status:Full code  Family Communication: None at bedside  Time spent: 35 minutes-Greater than 50% of this time was spent in counseling, explanation of diagnosis, planning of further management, and coordination of care.  Diet: Diet Order             Diet full liquid Room service appropriate? Yes; Fluid consistency: Thin  Diet effective now                      Disposition Plan: Status is: Inpatient  Remains inpatient appropriate because:Inpatient level of care appropriate due to severity of illness  Dispo: The patient is from: Home              Anticipated d/c is to: CIR              Patient currently is not medically stable to d/c.   Difficult to place patient No    Barriers to Discharge: Stroke work up ongoing-CIR on d/c  Antimicrobial agents: Anti-infectives (From admission, onward)    None        MEDICATIONS: Scheduled Meds:   stroke: mapping our early stages of recovery book   Does not apply  Once   amLODipine  10 mg Oral Daily   carvedilol  25 mg Oral BID WC   Chlorhexidine Gluconate Cloth  6 each Topical Daily   ferrous sulfate  300 mg Oral Daily   pantoprazole sodium  40 mg Oral BID   senna-docusate  1 tablet Oral BID   Continuous Infusions: PRN Meds:.acetaminophen **OR** acetaminophen (TYLENOL) oral liquid 160 mg/5 mL **OR** acetaminophen, labetalol   I have personally reviewed following labs and imaging studies  LABORATORY DATA: CBC: Recent Labs  Lab  03/13/21 1610 03/14/21 0832 03/15/21 0156  WBC 11.1* 10.1 10.4  NEUTROABS 8.0*  --   --   HGB 12.6* 11.6* 11.0*  HCT 39.6 36.6* 33.4*  MCV 80.5 79.6* 78.0*  PLT 343 337 0000000    Basic Metabolic Panel: Recent Labs  Lab 03/13/21 1610 03/14/21 0832 03/15/21 0156  NA 140 139 139  K 3.8 3.8 3.8  CL 108 107 109  CO2 26 20* 23  GLUCOSE 110* 95 83  BUN '15 18 20  '$ CREATININE 1.91* 2.21* 2.17*  CALCIUM 8.0* 7.8* 7.7*    GFR: Estimated Creatinine Clearance: 23.3 mL/min (A) (by C-G formula based on SCr of 2.17 mg/dL (H)).  Liver Function Tests: Recent Labs  Lab 03/13/21 1610  AST 29  ALT 19  ALKPHOS 79  BILITOT 0.4  PROT 4.9*  ALBUMIN 2.0*   Recent Labs  Lab 03/13/21 1610  LIPASE 27   No results for input(s): AMMONIA in the last 168 hours.  Coagulation Profile: Recent Labs  Lab 03/13/21 1728  INR 1.0    Cardiac Enzymes: No results for input(s): CKTOTAL, CKMB, CKMBINDEX, TROPONINI in the last 168 hours.  BNP (last 3 results) No results for input(s): PROBNP in the last 8760 hours.  Lipid Profile: Recent Labs    03/14/21 0832  CHOL 260*  HDL 89  LDLCALC 148*  TRIG 113  CHOLHDL 2.9    Thyroid Function Tests: No results for input(s): TSH, T4TOTAL, FREET4, T3FREE, THYROIDAB in the last 72 hours.  Anemia Panel: No results for input(s): VITAMINB12, FOLATE, FERRITIN, TIBC, IRON, RETICCTPCT in the last 72 hours.  Urine analysis:    Component Value Date/Time   COLORURINE YELLOW (A) 03/13/2021 2017   APPEARANCEUR CLEAR (A) 03/13/2021 2017   LABSPEC 1.015 03/13/2021 2017   PHURINE 7.5 03/13/2021 2017   GLUCOSEU 250 (A) 03/13/2021 2017   HGBUR MODERATE (A) 03/13/2021 2017   BILIRUBINUR NEGATIVE 03/13/2021 2017   KETONESUR NEGATIVE 03/13/2021 2017   PROTEINUR >300 (A) 03/13/2021 2017   NITRITE NEGATIVE 03/13/2021 2017   LEUKOCYTESUR NEGATIVE 03/13/2021 2017    Sepsis Labs: Lactic Acid, Venous    Component Value Date/Time   LATICACIDVEN 1.1  02/10/2021 2214    MICROBIOLOGY: Recent Results (from the past 240 hour(s))  SARS CORONAVIRUS 2 (TAT 6-24 HRS) Nasopharyngeal Nasopharyngeal Swab     Status: None   Collection Time: 03/13/21  9:09 PM   Specimen: Nasopharyngeal Swab  Result Value Ref Range Status   SARS Coronavirus 2 NEGATIVE NEGATIVE Final    Comment: (NOTE) SARS-CoV-2 target nucleic acids are NOT DETECTED.  The SARS-CoV-2 RNA is generally detectable in upper and lower respiratory specimens during the acute phase of infection. Negative results do not preclude SARS-CoV-2 infection, do not rule out co-infections with other pathogens, and should not be used as the sole basis for treatment or other patient management decisions. Negative results must be combined with clinical observations, patient history, and epidemiological information. The expected result is  Negative.  Fact Sheet for Patients: SugarRoll.be  Fact Sheet for Healthcare Providers: https://www.woods-mathews.com/  This test is not yet approved or cleared by the [place] FDA and  has been authorized for detection and/or diagnosis of SARS-CoV-2 by FDA under an Emergency Use Authorization (EUA). This EUA will remain  in effect (meaning this test can be used) for the duration of the COVID-19 declaration under Se ction 564(b)(1) of the Act, 21 U.S.C. section 360bbb-3(b)(1), unless the authorization is terminated or revoked sooner.  Performed at [facility], [place] [address], [place], [place] [address]   MRSA Next Gen by PCR, Nasal     Status: None   Collection Time: 03/13/21 10:43 PM   Specimen: Nasal Mucosa; Nasal Swab  Result Value Ref Range Status   MRSA by PCR Next Gen NOT DETECTED NOT DETECTED Final    Comment: (NOTE) The GeneXpert MRSA Assay (FDA approved for NASAL specimens only), is one component of a comprehensive MRSA colonization surveillance program. It is not intended to diagnose MRSA  infection nor to guide or monitor treatment for MRSA infections. Test performance is not FDA approved in patients less than 83 years old. Performed at [facility], [place] [address], [place], [place] [address]     RADIOLOGY STUDIES/RESULTS: CT HEAD WO CONTRAST (5MM)  Result Date: 03/13/2021 CLINICAL DATA:  Hemorrhage follow-up EXAM: CT HEAD WITHOUT CONTRAST TECHNIQUE: Contiguous axial images were obtained from the base of the skull through the vertex without intravenous contrast. COMPARISON:  03/13/2021 FINDINGS: Brain: Unchanged focus of intraparenchymal hemorrhage centered in the left basal ganglia with surrounding edema. There is periventricular hypoattenuation compatible with chronic microvascular disease. No midline shift or other mass effect. Vascular: No abnormal hyperdensity of the major intracranial arteries or dural venous sinuses. No intracranial atherosclerosis. Skull: The visualized skull base, calvarium and extracranial soft tissues are normal. Sinuses/Orbits: No fluid levels or advanced mucosal thickening of the visualized paranasal sinuses. No mastoid or middle ear effusion. The orbits are normal. IMPRESSION: Unchanged focus of intraparenchymal hemorrhage centered in the left basal ganglia with surrounding edema. Electronically Signed   By: Ulyses Jarred M.D.   On: 03/13/2021 21:43   CT HEAD WO CONTRAST (5MM)  Result Date: 03/13/2021 CLINICAL DATA:  Altered mental status EXAM: CT HEAD WITHOUT CONTRAST TECHNIQUE: Contiguous axial images were obtained from the base of the skull through the vertex without intravenous contrast. COMPARISON:  CT head 02/26/2021 FINDINGS: Brain: There is an acute left thalamic intraparenchymal hematoma measuring 1.7 cm x 1.1 cm by 1.5 cm. There is surrounding edema without resulting mass effect or midline shift. There is no intraventricular extension. No other acute intracranial hemorrhage is identified. There is global parenchymal volume loss with  commensurate enlargement of the ventricular system. The ventricles are stable in size compared to the prior CT. Confluent hypodensity throughout the subcortical and periventricular white matter likely reflecting sequela of chronic white matter microangiopathy. Remote lacunar infarcts are seen in the bilateral basal ganglia. No mass lesion is identified. Vascular: There is calcification of the bilateral cavernous ICAs. Skull: Normal. Negative for fracture or focal lesion. Sinuses/Orbits: The imaged paranasal sinuses are clear. Bilateral lens implants are in place. The globes and orbits are otherwise unremarkable. Other: None. IMPRESSION: Acute left thalamic intraparenchymal hematoma with surrounding edema but no midline shift. These results were called by telephone at the time of interpretation on 03/13/2021 at 4:59 pm to provider Dr Dina Rich, who verbally acknowledged these results. Electronically Signed   By: Collier Salina  Noone M.D.   On: 03/13/2021 17:02   ECHOCARDIOGRAM COMPLETE  Result Date: 03/14/2021    ECHOCARDIOGRAM REPORT   Patient Name:   [facility] Date of Exam: 03/14/2021 Medical Rec #:  [id number]     Height:       69.0 in Accession #:    [id number]    Weight:       141.1 lb Date of Birth:  [birth year]    BSA:          1.781 m Patient Age:    61 years      BP:           125/98 mmHg Patient Gender: M             HR:           110 bpm. Exam Location:  Inpatient Procedure: 2D Echo, Cardiac Doppler and Color Doppler Indications:    Stroke I63.9  History:        Patient has prior history of Echocardiogram examinations, most                 recent 03/05/2017. CHF, Stroke; Risk Factors:Non-Smoker and                 Hypertension. SVT.  Sonographer:    Leavy Cella RDCS Referring Phys: [phone] MCNEILL P KIRKPATRICK  Sonographer Comments: Technically difficult study due to poor echo windows. IMPRESSIONS  1. Left ventricular ejection fraction, by estimation, is 70 to 75%. The left ventricle has hyperdynamic function.  The left ventricle has no regional wall motion abnormalities. There is mild asymmetric left ventricular hypertrophy of the basal-septal segment. Indeterminate diastolic filling due to E-A fusion.  2. Right ventricular systolic function is normal. The right ventricular size is normal. There is mildly elevated pulmonary artery systolic pressure. The estimated right ventricular systolic pressure is 123456 mmHg.  3. A small pericardial effusion is present. The pericardial effusion is circumferential.  4. The mitral valve is grossly normal. No evidence of mitral valve regurgitation. No evidence of mitral stenosis.  5. Aortic regurgitation appears trivial. Color scale inappropriately adjusted during this study. The aortic valve is tricuspid. There is mild calcification of the aortic valve. Aortic valve regurgitation is trivial. Mild to moderate aortic valve sclerosis/calcification is present, without any evidence of aortic stenosis.  6. The inferior vena cava is normal in size with greater than 50% respiratory variability, suggesting right atrial pressure of 3 mmHg. Conclusion(s)/Recommendation(s): No intracardiac source of embolism detected on this transthoracic study. A transesophageal echocardiogram is recommended to exclude cardiac source of embolism if clinically indicated. FINDINGS  Left Ventricle: Left ventricular ejection fraction, by estimation, is 70 to 75%. The left ventricle has hyperdynamic function. The left ventricle has no regional wall motion abnormalities. The left ventricular internal cavity size was normal in size. There is mild asymmetric left ventricular hypertrophy of the basal-septal segment. Indeterminate diastolic filling due to E-A fusion. Right Ventricle: The right ventricular size is normal. No increase in right ventricular wall thickness. Right ventricular systolic function is normal. There is mildly elevated pulmonary artery systolic pressure. The tricuspid regurgitant velocity is 2.94  m/s,  and with an assumed right atrial pressure of 3 mmHg, the estimated right ventricular systolic pressure is 123456 mmHg. Left Atrium: Left atrial size was normal in size. Right Atrium: Right atrial size was normal in size. Pericardium: A small pericardial effusion is present. The pericardial effusion is circumferential. Mitral Valve: The mitral valve is grossly normal. No evidence of  mitral valve regurgitation. No evidence of mitral valve stenosis. Tricuspid Valve: The tricuspid valve is grossly normal. Tricuspid valve regurgitation is trivial. No evidence of tricuspid stenosis. Aortic Valve: Aortic regurgitation appears trivial. Color scale inappropriately adjusted during this study. The aortic valve is tricuspid. There is mild calcification of the aortic valve. Aortic valve regurgitation is trivial. Aortic regurgitation PHT measures 449 msec. Mild to moderate aortic valve sclerosis/calcification is present, without any evidence of aortic stenosis. Pulmonic Valve: The pulmonic valve was not well visualized. Pulmonic valve regurgitation is not visualized. Aorta: The aortic root was not well visualized and the ascending aorta was not well visualized. Venous: The inferior vena cava is normal in size with greater than 50% respiratory variability, suggesting right atrial pressure of 3 mmHg. IAS/Shunts: The atrial septum is grossly normal.  LEFT VENTRICLE PLAX 2D LVIDd:         3.90 cm LVIDs:         2.50 cm LV PW:         1.00 cm LV IVS:        1.30 cm LVOT diam:     2.10 cm LVOT Area:     3.46 cm  RIGHT VENTRICLE RV S prime:     12.50 cm/s TAPSE (M-mode): 1.0 cm LEFT ATRIUM             Index       RIGHT ATRIUM          Index LA diam:        3.20 cm 1.80 cm/m  RA Area:     7.58 cm LA Vol (A2C):   25.5 ml 14.32 ml/m RA Volume:   13.60 ml 7.63 ml/m LA Vol (A4C):   26.1 ml 14.65 ml/m LA Biplane Vol: 26.8 ml 15.05 ml/m  AORTIC VALVE AI PHT:      449 msec MITRAL VALVE                TRICUSPID VALVE MV Area (PHT): 5.42 cm      TR Peak grad:   34.6 mmHg MV Decel Time: 140 msec     TR Vmax:        294.00 cm/s MV E velocity: 110.00 cm/s                             SHUNTS                             Systemic Diam: 2.10 cm Eleonore Chiquito MD Electronically signed by Eleonore Chiquito MD Signature Date/Time: 03/14/2021/12:40:04 PM    Final      LOS: 2 days   Oren Binet, MD  [facility]    To contact the attending provider between 7A-7P or the covering provider during after hours 7P-7A, please log into the web site [email or web address] and access using universal [place] password for that web site. If you do not have the password, please call the hospital operator.  03/15/2021, 9:28 AM

## 2021-03-15 NOTE — Plan of Care (Signed)
  Problem: Safety: Goal: Ability to remain free from injury will improve Outcome: Progressing   Problem: Skin Integrity: Goal: Risk for impaired skin integrity will decrease Outcome: Progressing   Problem: Self-Care: Goal: Ability to communicate needs accurately will improve Outcome: Progressing   Problem: Intracerebral Hemorrhage Tissue Perfusion: Goal: Complications of Intracerebral Hemorrhage will be minimized Outcome: Progressing

## 2021-03-16 LAB — CBC
HCT: 35.6 % — ABNORMAL LOW (ref 39.0–52.0)
Hemoglobin: 11.3 g/dL — ABNORMAL LOW (ref 13.0–17.0)
MCH: 25.2 pg — ABNORMAL LOW (ref 26.0–34.0)
MCHC: 31.7 g/dL (ref 30.0–36.0)
MCV: 79.5 fL — ABNORMAL LOW (ref 80.0–100.0)
Platelets: 272 10*3/uL (ref 150–400)
RBC: 4.48 MIL/uL (ref 4.22–5.81)
RDW: 25.6 % — ABNORMAL HIGH (ref 11.5–15.5)
WBC: 8.3 10*3/uL (ref 4.0–10.5)
nRBC: 0 % (ref 0.0–0.2)

## 2021-03-16 LAB — BASIC METABOLIC PANEL
Anion gap: 7 (ref 5–15)
BUN: 28 mg/dL — ABNORMAL HIGH (ref 8–23)
CO2: 23 mmol/L (ref 22–32)
Calcium: 7.6 mg/dL — ABNORMAL LOW (ref 8.9–10.3)
Chloride: 109 mmol/L (ref 98–111)
Creatinine, Ser: 2.34 mg/dL — ABNORMAL HIGH (ref 0.61–1.24)
GFR, Estimated: 27 mL/min — ABNORMAL LOW (ref >60)
Glucose, Bld: 90 mg/dL (ref 70–99)
Potassium: 3.7 mmol/L (ref 3.5–5.1)
Sodium: 139 mmol/L (ref 135–145)

## 2021-03-16 MED ORDER — ENSURE ENLIVE PO LIQD
237.0000 mL | Freq: Three times a day (TID) | ORAL | Status: DC
  Administered 2021-03-16 – 2021-03-19 (×10): 237 mL via ORAL

## 2021-03-16 MED ORDER — ADULT MULTIVITAMIN W/MINERALS CH
1.0000 | ORAL_TABLET | Freq: Every day | ORAL | Status: DC
  Administered 2021-03-16 – 2021-03-19 (×4): 1 via ORAL
  Filled 2021-03-16 (×4): qty 1

## 2021-03-16 NOTE — Progress Notes (Signed)
PROGRESS NOTE        PATIENT DETAILS Name: Logan [name] Age: 86 y.o. Sex: male Date of Birth: [birth year] Admit Date: 03/13/2021 Admitting Physician Logan Doom, MD [id number], Logan Manner, MD  Brief Narrative: Patient is a 86 y.o. 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  He was initially admitted by neurology to the [facility] subsequently transferred to [name] on 9/18.  Subjective: Following simple questions-lying comfortably in bed.  Objective: Vitals: Blood pressure 138/81, pulse 78, temperature 98 F (36.7 C), temperature source Oral, resp. rate 18, SpO2 97 %.   Exam: Gen Exam:Alert awake-not in any distress HEENT:atraumatic, normocephalic Chest: B/L clear to auscultation anteriorly CVS:S1S2 regular Abdomen:soft non tender, non distended Extremities:no edema Neurology: Chronic right-sided weakness Skin: no rash   Pertinent Labs/Radiology: WBC: 10.4 Hb: 11.0 Creatinine: 2.17  Stroke work-up: LDL: 148 A1c: 5.3 9/16>> CT head: Left basal ganglia ICH 9/17>> Echo: EF 70-75%, RVSP 37.6 9/18>> carotid Doppler: No significant stenosis. 9/18>> CT head: Unchanged left basal ganglia infarct with surrounding edema.  Assessment/Plan: Left basal ganglia ICH: Secondary to hypertension-continue to optimize BP control-work-up as above-recommendations are for CIR on discharge.    History of prior CVA with chronic right-sided deficits  HTN: BP better this morning-continue Coreg, amlodipine.  Goal SBP <160.    HLD: Plan is to resume Lipitor on discharge.  Recent history of odynophagia due to herpes esophagitis: Completed a course of acyclovir.  Continue PPI twice daily dosing.  CKD stage IV: Creatinine close to baseline  Microcytic anemia: Appears to have iron deficiency-Per  most recent work-up.  Remains on iron supplementation.  Follow CBC periodically.  History of dementia: Appears mildly confused-but language barrier-he is able to follow simple commands when spoken in Hindi.  Procedures :None Consults:Neuro DVT Prophylaxis: SCD's Code Status:Full code  Family Communication: None at bedside  Time spent: 25 minutes-Greater than 50% of this time was spent in counseling, explanation of diagnosis, planning of further management, and coordination of care.  Diet: Diet Order             DIET DYS 3 Room service appropriate? Yes; Fluid consistency: Thin  Diet effective now                      Disposition Plan: Status is: Inpatient  Remains inpatient appropriate because:Inpatient level of care appropriate due to severity of illness  Dispo: The patient is from: Home              Anticipated d/c is to: CIR              Patient currently is medically stable to d/c.   Difficult to place patient No    Barriers to Discharge: awaiting CIR bed.  Antimicrobial agents: Anti-infectives (From admission, onward)    None        MEDICATIONS: Scheduled Meds:   stroke: mapping our early stages of recovery book   Does not apply Once   amLODipine  10 mg Oral Daily   carvedilol  25 mg Oral BID WC   Chlorhexidine Gluconate Cloth  6 each Topical Q0600   ferrous sulfate  300 mg Oral Daily   pantoprazole sodium  40 mg Oral BID  senna-docusate  1 tablet Oral BID   Continuous Infusions: PRN Meds:.acetaminophen **OR** acetaminophen (TYLENOL) oral liquid 160 mg/5 mL **OR** acetaminophen, labetalol   I have personally reviewed following labs and imaging studies  LABORATORY DATA: CBC: Recent Labs  Lab 03/13/21 1610 03/14/21 0832 03/15/21 0156 03/16/21 0849  WBC 11.1* 10.1 10.4 8.3  NEUTROABS 8.0*  --   --   --   HGB 12.6* 11.6* 11.0* 11.3*  HCT 39.6 36.6* 33.4* 35.6*  MCV 80.5 79.6* 78.0* 79.5*  PLT 343 337 296 272     Basic Metabolic  Panel: Recent Labs  Lab 03/13/21 1610 03/14/21 0832 03/15/21 0156 03/16/21 0631  NA 140 139 139 139  K 3.8 3.8 3.8 3.7  CL 108 107 109 109  CO2 26 20* 23 23  GLUCOSE 110* 95 83 90  BUN '15 18 20 '$ 28*  CREATININE 1.91* 2.21* 2.17* 2.34*  CALCIUM 8.0* 7.8* 7.7* 7.6*     GFR: Estimated Creatinine Clearance: 21.7 mL/min (A) (by C-G formula based on SCr of 2.34 mg/dL (H)).  Liver Function Tests: Recent Labs  Lab 03/13/21 1610  AST 29  ALT 19  ALKPHOS 79  BILITOT 0.4  PROT 4.9*  ALBUMIN 2.0*    Recent Labs  Lab 03/13/21 1610  LIPASE 27    No results for input(s): AMMONIA in the last 168 hours.  Coagulation Profile: Recent Labs  Lab 03/13/21 1728  INR 1.0     Cardiac Enzymes: No results for input(s): CKTOTAL, CKMB, CKMBINDEX, TROPONINI in the last 168 hours.  BNP (last 3 results) No results for input(s): PROBNP in the last 8760 hours.  Lipid Profile: Recent Labs    03/14/21 0832  CHOL 260*  HDL 89  LDLCALC 148*  TRIG 113  CHOLHDL 2.9     Thyroid Function Tests: No results for input(s): TSH, T4TOTAL, FREET4, T3FREE, THYROIDAB in the last 72 hours.  Anemia Panel: No results for input(s): VITAMINB12, FOLATE, FERRITIN, TIBC, IRON, RETICCTPCT in the last 72 hours.  Urine analysis:    Component Value Date/Time   COLORURINE YELLOW (A) 03/13/2021 2017   APPEARANCEUR CLEAR (A) 03/13/2021 2017   LABSPEC 1.015 03/13/2021 2017   PHURINE 7.5 03/13/2021 2017   GLUCOSEU 250 (A) 03/13/2021 2017   HGBUR MODERATE (A) 03/13/2021 2017   BILIRUBINUR NEGATIVE 03/13/2021 2017   KETONESUR NEGATIVE 03/13/2021 2017   PROTEINUR >300 (A) 03/13/2021 2017   NITRITE NEGATIVE 03/13/2021 2017   LEUKOCYTESUR NEGATIVE 03/13/2021 2017    Sepsis Labs: Lactic Acid, Venous    Component Value Date/Time   LATICACIDVEN 1.1 02/10/2021 2214    MICROBIOLOGY: Recent Results (from the past 240 hour(s))  SARS CORONAVIRUS 2 (TAT 6-24 HRS) Nasopharyngeal Nasopharyngeal Swab      Status: None   Collection Time: 03/13/21  9:09 PM   Specimen: Nasopharyngeal Swab  Result Value Ref Range Status   SARS Coronavirus 2 NEGATIVE NEGATIVE Final    Comment: (NOTE) SARS-CoV-2 target nucleic acids are NOT DETECTED.  The SARS-CoV-2 RNA is generally detectable in upper and lower respiratory specimens during the acute phase of infection. Negative results do not preclude SARS-CoV-2 infection, do not rule out co-infections with other pathogens, and should not be used as the sole basis for treatment or other patient management decisions. Negative results must be combined with clinical observations, patient history, and epidemiological information. The expected result is Negative.  Fact Sheet for Patients: SugarRoll.be  Fact Sheet for Healthcare Providers: https://www.woods-mathews.com/  This test is not yet approved or  cleared by the [place] and  has been authorized for detection and/or diagnosis of SARS-CoV-2 by FDA under an Emergency Use Authorization (EUA). This EUA will remain  in effect (meaning this test can be used) for the duration of the COVID-19 declaration under Se ction 564(b)(1) of the Act, 21 U.S.C. section 360bbb-3(b)(1), unless the authorization is terminated or revoked sooner.  Performed at [facility], [place] [address] [place], [place] [address]   MRSA Next Gen by PCR, Nasal     Status: None   Collection Time: 03/13/21 10:43 PM   Specimen: Nasal Mucosa; Nasal Swab  Result Value Ref Range Status   MRSA by PCR Next Gen NOT DETECTED NOT DETECTED Final    Comment: (NOTE) The GeneXpert MRSA Assay (FDA approved for NASAL specimens only), is one component of a comprehensive MRSA colonization surveillance program. It is not intended to diagnose MRSA infection nor to guide or monitor treatment for MRSA infections. Test performance is not FDA approved in patients less than 60 years old. Performed at  [facility], [place] [address], [place], [place] [address]     RADIOLOGY STUDIES/RESULTS: CT HEAD WO CONTRAST (5MM)  Result Date: 03/15/2021 CLINICAL DATA:  86 year old male for follow-up of LEFT basal ganglia hemorrhage. EXAM: CT HEAD WITHOUT CONTRAST TECHNIQUE: Contiguous axial images were obtained from the base of the skull through the vertex without intravenous contrast. COMPARISON:  03/13/2021 CT and prior studies FINDINGS: Brain: LEFT basal ganglia hemorrhage with surrounding edema is unchanged. No new hemorrhage is present. Atrophy, chronic small-vessel white matter ischemic changes, and remote basal ganglia infarcts again noted. No new abnormalities are present. Vascular: Carotid atherosclerotic calcifications are noted. Skull: Normal. Negative for fracture or focal lesion. Sinuses/Orbits: No acute finding. Other: None IMPRESSION: 1. Unchanged LEFT basal ganglia hemorrhage with surrounding edema. No new hemorrhage. 2. Atrophy, chronic small-vessel white matter ischemic changes and remote basal ganglia infarcts. Electronically Signed   By: Margarette Canada M.D.   On: 03/15/2021 14:39   ECHOCARDIOGRAM COMPLETE  Result Date: 03/14/2021    ECHOCARDIOGRAM REPORT   Patient Name:   Logan [name] Date of Exam: 03/14/2021 Medical Rec #:  [id number]     Height:       69.0 in Accession #:    [id number]    Weight:       141.1 lb Date of Birth:  01-Sep-1934    BSA:          1.781 m Patient Age:    53 years      BP:           125/98 mmHg Patient Gender: M             HR:           110 bpm. Exam Location:  Inpatient Procedure: 2D Echo, Cardiac Doppler and Color Doppler Indications:    Stroke I63.9  History:        Patient has prior history of Echocardiogram examinations, most                 recent 03/05/2017. CHF, Stroke; Risk Factors:Non-Smoker and                 Hypertension. SVT.  Sonographer:    Leavy Cella RDCS Referring Phys: [phone] MCNEILL P KIRKPATRICK  Sonographer Comments: Technically difficult study  due to poor echo windows. IMPRESSIONS  1. Left ventricular ejection fraction, by estimation, is 70 to 75%. The left ventricle has hyperdynamic function.  The left ventricle has no regional wall motion abnormalities. There is mild asymmetric left ventricular hypertrophy of the basal-septal segment. Indeterminate diastolic filling due to E-A fusion.  2. Right ventricular systolic function is normal. The right ventricular size is normal. There is mildly elevated pulmonary artery systolic pressure. The estimated right ventricular systolic pressure is 123456 mmHg.  3. A small pericardial effusion is present. The pericardial effusion is circumferential.  4. The mitral valve is grossly normal. No evidence of mitral valve regurgitation. No evidence of mitral stenosis.  5. Aortic regurgitation appears trivial. Color scale inappropriately adjusted during this study. The aortic valve is tricuspid. There is mild calcification of the aortic valve. Aortic valve regurgitation is trivial. Mild to moderate aortic valve sclerosis/calcification is present, without any evidence of aortic stenosis.  6. The inferior vena cava is normal in size with greater than 50% respiratory variability, suggesting right atrial pressure of 3 mmHg. Conclusion(s)/Recommendation(s): No intracardiac source of embolism detected on this transthoracic study. A transesophageal echocardiogram is recommended to exclude cardiac source of embolism if clinically indicated. FINDINGS  Left Ventricle: Left ventricular ejection fraction, by estimation, is 70 to 75%. The left ventricle has hyperdynamic function. The left ventricle has no regional wall motion abnormalities. The left ventricular internal cavity size was normal in size. There is mild asymmetric left ventricular hypertrophy of the basal-septal segment. Indeterminate diastolic filling due to E-A fusion. Right Ventricle: The right ventricular size is normal. No increase in right ventricular wall thickness. Right  ventricular systolic function is normal. There is mildly elevated pulmonary artery systolic pressure. The tricuspid regurgitant velocity is 2.94  m/s, and with an assumed right atrial pressure of 3 mmHg, the estimated right ventricular systolic pressure is 123456 mmHg. Left Atrium: Left atrial size was normal in size. Right Atrium: Right atrial size was normal in size. Pericardium: A small pericardial effusion is present. The pericardial effusion is circumferential. Mitral Valve: The mitral valve is grossly normal. No evidence of mitral valve regurgitation. No evidence of mitral valve stenosis. Tricuspid Valve: The tricuspid valve is grossly normal. Tricuspid valve regurgitation is trivial. No evidence of tricuspid stenosis. Aortic Valve: Aortic regurgitation appears trivial. Color scale inappropriately adjusted during this study. The aortic valve is tricuspid. There is mild calcification of the aortic valve. Aortic valve regurgitation is trivial. Aortic regurgitation PHT measures 449 msec. Mild to moderate aortic valve sclerosis/calcification is present, without any evidence of aortic stenosis. Pulmonic Valve: The pulmonic valve was not well visualized. Pulmonic valve regurgitation is not visualized. Aorta: The aortic root was not well visualized and the ascending aorta was not well visualized. Venous: The inferior vena cava is normal in size with greater than 50% respiratory variability, suggesting right atrial pressure of 3 mmHg. IAS/Shunts: The atrial septum is grossly normal.  LEFT VENTRICLE PLAX 2D LVIDd:         3.90 cm LVIDs:         2.50 cm LV PW:         1.00 cm LV IVS:        1.30 cm LVOT diam:     2.10 cm LVOT Area:     3.46 cm  RIGHT VENTRICLE RV S prime:     12.50 cm/s TAPSE (M-mode): 1.0 cm LEFT ATRIUM             Index       RIGHT ATRIUM          Index LA diam:  3.20 cm 1.80 cm/m  RA Area:     7.58 cm LA Vol (A2C):   25.5 ml 14.32 ml/m RA Volume:   13.60 ml 7.63 ml/m LA Vol (A4C):   26.1 ml  14.65 ml/m LA Biplane Vol: 26.8 ml 15.05 ml/m  AORTIC VALVE AI PHT:      449 msec MITRAL VALVE                TRICUSPID VALVE MV Area (PHT): 5.42 cm     TR Peak grad:   34.6 mmHg MV Decel Time: 140 msec     TR Vmax:        294.00 cm/s MV E velocity: 110.00 cm/s                             SHUNTS                             Systemic Diam: 2.10 cm Eleonore Chiquito MD Electronically signed by Eleonore Chiquito MD Signature Date/Time: 03/14/2021/12:40:04 PM    Final    VAS US CAROTID  Result Date: 03/16/2021 Carotid Arterial Duplex Study Patient Name:  Logan [name]  Date of Exam:   03/15/2021 Medical Rec #: [id number]      Accession #:    [id number] Date of Birth: [birth year]     Patient Gender: M Patient Age:   38 years Exam Location:  [facility] Procedure:      VAS US CAROTID Referring Phys: Rosalin Hawking --------------------------------------------------------------------------------  Indications:       CVA. Risk Factors:      Hyperlipidemia, Diabetes, prior CVA. Limitations        Today's exam was limited due to the patient's inability or                    unwillingness to cooperate. Comparison Study:  no prior Performing Technologist: Archie Patten RVS  Examination Guidelines: A complete evaluation includes B-mode imaging, spectral Doppler, color Doppler, and power Doppler as needed of all accessible portions of each vessel. Bilateral testing is considered an integral part of a complete examination. Limited examinations for reoccurring indications may be performed as noted.  Right Carotid Findings: +----------+--------+--------+--------+------------------+--------+           PSV cm/sEDV cm/sStenosisPlaque DescriptionComments +----------+--------+--------+--------+------------------+--------+ CCA Prox  68      11              heterogenous               +----------+--------+--------+--------+------------------+--------+ CCA Distal69      14              heterogenous                +----------+--------+--------+--------+------------------+--------+ ICA Prox  50      15      1-39%   heterogenous               +----------+--------+--------+--------+------------------+--------+ ICA Distal38      9                                          +----------+--------+--------+--------+------------------+--------+ ECA       76                                                 +----------+--------+--------+--------+------------------+--------+ +----------+--------+-------+--------+-------------------+  PSV cm/sEDV cmsDescribeArm Pressure (mmHG) +----------+--------+-------+--------+-------------------+ Subclavian70                                         +----------+--------+-------+--------+-------------------+ +---------+--------+--+--------+--+---------+ VertebralPSV cm/s42EDV cm/s14Antegrade +---------+--------+--+--------+--+---------+  Left Carotid Findings: +----------+--------+--------+--------+------------------+--------+           PSV cm/sEDV cm/sStenosisPlaque DescriptionComments +----------+--------+--------+--------+------------------+--------+ CCA Prox  42      6               heterogenous               +----------+--------+--------+--------+------------------+--------+ CCA Distal48      16              heterogenous               +----------+--------+--------+--------+------------------+--------+ ICA Prox  51      16      1-39%   heterogenous               +----------+--------+--------+--------+------------------+--------+ ICA Distal36      8                                          +----------+--------+--------+--------+------------------+--------+ ECA       52                                                 +----------+--------+--------+--------+------------------+--------+ +----------+--------+--------+--------+-------------------+           PSV cm/sEDV cm/sDescribeArm Pressure (mmHG)  +----------+--------+--------+--------+-------------------+ [id number]                                          +----------+--------+--------+--------+-------------------+ +---------+--------+--+--------+--+---------+ VertebralPSV cm/s44EDV cm/s12Antegrade +---------+--------+--+--------+--+---------+   Summary: Right Carotid: Velocities in the right ICA are consistent with a 1-39% stenosis. Left Carotid: Velocities in the left ICA are consistent with a 1-39% stenosis. Vertebrals: Bilateral vertebral arteries demonstrate antegrade flow. *See table(s) above for measurements and observations.  Electronically signed by Antony Contras MD on 03/16/2021 at 9:15:58 AM.    Final      LOS: 3 days   Oren Binet, MD  [facility]    To contact the attending provider between 7A-7P or the covering provider during after hours 7P-7A, please log into the web site [email or web address] and access using universal  password for that web site. If you do not have the password, please call the hospital operator.  03/16/2021, 10:54 AM

## 2021-03-16 NOTE — Care Management Important Message (Signed)
Important Message  Patient Details  Name: Logan [name] MRN: [id number] Date of Birth: 1934/07/17   Medicare Important Message Given:  Yes     Memory Argue 03/16/2021, 12:40 PM

## 2021-03-16 NOTE — Progress Notes (Signed)
Initial Nutrition Assessment  DOCUMENTATION CODES:  Not applicable  INTERVENTION:  Obtain updated weight.  Add Ensure Enlive po TID, each supplement provides 350 kcal and 20 grams of protein.  Add MVI with minerals daily.  NUTRITION DIAGNOSIS:  Inadequate oral intake related to lethargy/confusion as evidenced by per patient/family report, meal completion < 25%.  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 He was initially admitted by neurology to the [facility] subsequently transferred to [facility] on 9/18. 9/17 - advanced to fulls 9/18 - advanced to Dysphagia 3/thins  RD working remotely. Pt unable to be reached by phone.  Per Epic, pt ate 10% of breakfast yesterday. No other meals documented. Pt noted to be a vegetarian, who does not eat eggs.  No admit weight taken. RD to order weight to accurately determine weight loss. Pt's weight appears to be trending down gradually. Pt noted to have mild edema in all extremities on this admission.  Pt previously diagnosed with moderate malnutrition - RD cannot definitively diagnose again at this time, but RD suspects this diagnosis would continue.  Recommend adding Ensure TID and MVI with minerals.  Medications: reviewed; ferrous sulfate syrup, Protonix BID, Senokot BID  Labs: reviewed  NUTRITION - FOCUSED PHYSICAL EXAM: Unable to perform - defer to in-person follow-up  Diet Order:   Diet Order             DIET DYS 3 Room service appropriate? Yes; Fluid consistency: Thin  Diet effective now                  EDUCATION NEEDS:  Not appropriate for  education at this time  Skin:  Skin Assessment: Reviewed RN Assessment  Last BM:  03/15/21 - Type 6, medium  Height:  Ht Readings from Last 1 Encounters:  02/26/21 '5\' 9"'$  (1.753 m)   Weight:  Wt Readings from Last 1 Encounters:  03/03/21 64 kg   BMI:  There is no height or weight on file to calculate BMI.  Estimated Nutritional Needs:  Kcal:  1650-1850 Protein:  80-95 grams Fluid:  >1.65 L  Derrel Nip, RD, LDN (she/her/hers) Registered Dietitian I After-Hours/Weekend Pager # in [place]

## 2021-03-16 NOTE — Progress Notes (Signed)
Physical Therapy Treatment Patient Details Name: Logan [name] MRN: [id number] DOB: 1934/08/29 Today's Date: 03/16/2021   History of Present Illness Pt is an 86 y/o male admitted 03/13/21 with vomiting blood, elevated BP, increased weakness of R side. CT revealed basal ganglia hemorrhage. PMH significant for dementia, herpes esophagitis, hyperlipidema, previous CVA with R sided weakness.    PT Comments    Pt progressing slowly towards physical therapy goals. Was able to perform transfers with up to +2 max assist (2 person HHA).  Noted CIR signing off as family requesting SNF level rehab. Agree with SNF as pt is requiring such a heavy level of assistance at this time. Will continue to follow and progress as able per POC.    Recommendations for follow up therapy are one component of a multi-disciplinary discharge planning process, led by the attending physician.  Recommendations may be updated based on patient status, additional functional criteria and insurance authorization.  Follow Up Recommendations  SNF     Equipment Recommendations   (TBD by next venue of care)    Recommendations for Other Services Rehab consult     Precautions / Restrictions Precautions Precautions: Fall Precaution Comments: use interpreter Gujarati Restrictions Weight Bearing Restrictions: No     Mobility  Bed Mobility Overal bed mobility: Needs Assistance Bed Mobility: Supine to Sit     Supine to sit: Mod assist;HOB elevated     General bed mobility comments: Pt advanced LE's minimally towards EOB but was able to continue with multimodal cues. Heavy mod assist to transition fully to EOB.    Transfers Overall transfer level: Needs assistance Equipment used: 2 person hand held assist Transfers: Sit to/from Stand Sit to Stand: Mod assist;+2 physical assistance;+2 safety/equipment;From elevated surface Stand pivot transfers: Mod assist;Max assist;+2 physical assistance       General transfer  comment: Sit>stand initially with increased time in static standing to perform peri-care. Up to +2 max assist required for transition from bed to chair. Pt with difficulty advancing LE's around and could not back up to the chair, so chair was brought up to him.  Ambulation/Gait             General Gait Details: Unable to progress to gait training this session.   Stairs             Wheelchair Mobility    Modified Rankin (Stroke Patients Only) Modified Rankin (Stroke Patients Only) Pre-Morbid Rankin Score: Moderately severe disability Modified Rankin: Severe disability     Balance Overall balance assessment: Needs assistance Sitting-balance support: Single extremity supported;Feet unsupported Sitting balance-Leahy Scale: Fair     Standing balance support: Bilateral upper extremity supported Standing balance-Leahy Scale: Zero Standing balance comment: +2 assist required                            Cognition Arousal/Alertness: Awake/alert Behavior During Therapy: WFL for tasks assessed/performed Overall Cognitive Status: Difficult to assess Area of Impairment: Following commands;Problem solving;Orientation;Awareness;Attention                 Orientation Level:  (Not answering orientation questions) Current Attention Level: Focused   Following Commands: Follows one step commands inconsistently Safety/Judgement: Decreased awareness of deficits Awareness: Intellectual Problem Solving: Slow processing;Decreased initiation;Requires verbal cues;Requires tactile cues        Exercises      General Comments        Pertinent Vitals/Pain Pain Assessment: Faces Faces Pain Scale: No hurt Pain Descriptors /  Indicators: Discomfort;Guarding Pain Intervention(s): Limited activity within patient's tolerance;Monitored during session;Repositioned    Home Living                      Prior Function            PT Goals (current goals can now  be found in the care plan section) Acute Rehab PT Goals Patient Stated Goal: did not state PT Goal Formulation: Patient unable to participate in goal setting Time For Goal Achievement: 03/28/21 Potential to Achieve Goals: Fair Progress towards PT goals: Progressing toward goals    Frequency    Min 3X/week      PT Plan Discharge plan needs to be updated;Frequency needs to be updated    Co-evaluation              AM-PAC PT "6 Clicks" Mobility   Outcome Measure  Help needed turning from your back to your side while in a flat bed without using bedrails?: Total Help needed moving from lying on your back to sitting on the side of a flat bed without using bedrails?: Total Help needed moving to and from a bed to a chair (including a wheelchair)?: Total Help needed standing up from a chair using your arms (e.g., wheelchair or bedside chair)?: Total Help needed to walk in hospital room?: Total Help needed climbing 3-5 steps with a railing? : Total 6 Click Score: 6    End of Session Equipment Utilized During Treatment: Gait belt Activity Tolerance: Patient tolerated treatment well Patient left: in bed;with call bell/phone within reach;with bed alarm set Nurse Communication: Mobility status PT Visit Diagnosis: Unsteadiness on feet (R26.81);Muscle weakness (generalized) (M62.81);Difficulty in walking, not elsewhere classified (R26.2);Other symptoms and signs involving the nervous system (R29.898)     Time: [id number] PT Time Calculation (min) (ACUTE ONLY): 25 min  Charges:  $Therapeutic Activity: 23-37 mins                     Rolinda Roan, PT, DPT Acute Rehabilitation Services Pager: [phone] Office: [phone]    Thelma Comp 03/16/2021, 1:47 PM

## 2021-03-16 NOTE — NC FL2 (Signed)
[place] LEVEL OF CARE SCREENING TOOL     IDENTIFICATION  Patient Name: Logan [name] Birthdate: December 03, 1934 Sex: male Admission Date (Current Location): 03/13/2021  [facility] and [place] Number:  Herbalist and Address:  [facility] [place]. 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[place]) 03/13/2021   ICH (intracerebral hemorrhage) ([place]) 03/13/2021   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   Failure to thrive in adult 02/26/2021   Proteinuria 02/12/2021   Intractable abdominal pain 02/11/2021   AKI (acute kidney injury) ([place]) 02/11/2021   Anemia 02/11/2021   CKD (chronic kidney disease), stage III ([place]) 02/11/2021   Benign essential HTN 02/11/2021   Acute on chronic renal insufficiency    Hypoalbuminemia    Acute systolic CHF (congestive heart failure) ([place])    S/P total right hip arthroplasty 04/30/2020   Abnormal CT scan, esophagus    Acute upper GI bleed 05/06/2017   Hyperkalemia 05/06/2017   Malnutrition of moderate degree 04/13/2017   Gastroesophageal reflux disease with esophagitis    Hyperglycemia 04/11/2017   Malnutrition of mild degree ([place]) 04/11/2017   Intractable vomiting with nausea    Physical deconditioning    Iron deficiency anemia    Upper GI bleed    Esophageal ulcer with bleeding    Cardiomegaly    Chronic diastolic CHF (congestive heart failure) (HCC)    Intractable hiccups    Cerebrovascular accident (CVA) ([place])    SVT (supraventricular tachycardia) ([place])    Hematemesis  03/03/2017    Orientation RESPIRATION BLADDER Height & Weight     Self  Normal Incontinent Weight:   Height:     BEHAVIORAL SYMPTOMS/MOOD NEUROLOGICAL BOWEL NUTRITION STATUS      Incontinent Diet (see DC summary)  AMBULATORY STATUS COMMUNICATION OF NEEDS Skin   Extensive Assist Verbally Skin abrasions                       Personal Care Assistance Level of Assistance  Bathing, Feeding, Dressing Bathing Assistance: Limited assistance Feeding assistance: Limited assistance Dressing Assistance: Limited assistance     Functional Limitations Info  Hearing, Speech   Hearing Info: Impaired (hard of hearing) Speech Info: Impaired (dysarthria)    SPECIAL CARE FACTORS FREQUENCY  PT (By licensed PT), OT (By licensed OT), Speech therapy     PT Frequency: 5x/wk OT Frequency: 5x/wk     Speech Therapy Frequency: 5x/wk      Contractures Contractures Info: Not present    Additional Factors Info  Code Status, Allergies Code Status Info: Full Allergies Info: NKA           Current Medications (03/16/2021):  This is the current hospital active medication list Current Facility-Administered Medications  Medication Dose Route Frequency Provider Last Rate Last Admin    stroke: mapping our early stages of recovery book   Does not apply Once Greta Doom, MD       acetaminophen (TYLENOL) tablet 650 mg  650 mg Oral Q4H PRN Greta Doom, MD  650 mg at 03/15/21 1342   Or   acetaminophen (TYLENOL) 160 MG/5ML solution 650 mg  650 mg Per Tube Q4H PRN Greta Doom, MD       Or   acetaminophen (TYLENOL) suppository 650 mg  650 mg Rectal Q4H PRN Greta Doom, MD       amLODipine (NORVASC) tablet 10 mg  10 mg Oral Daily Rosalin Hawking, MD   10 mg at 03/16/21 1017   carvedilol (COREG) tablet 25 mg  25 mg Oral BID WC Jonetta Osgood, MD   25 mg at 03/16/21 1017   Chlorhexidine Gluconate Cloth 2 % PADS 6 each  6 each Topical Q0600 Jonetta Osgood,  MD   6 each at 03/16/21 0447   ferrous sulfate 300 (60 Fe) MG/5ML syrup 300 mg  300 mg Oral Daily Rosalin Hawking, MD   300 mg at 03/16/21 1017   labetalol (NORMODYNE) injection 10 mg  10 mg Intravenous Q2H PRN Jonetta Osgood, MD       pantoprazole sodium (PROTONIX) 40 mg/20 mL oral suspension 40 mg  40 mg Oral BID Jonetta Osgood, MD   40 mg at 03/16/21 1020   senna-docusate (Senokot-S) tablet 1 tablet  1 tablet Oral BID Greta Doom, MD   1 tablet at 03/15/21 2245     Discharge Medications: Please see discharge summary for a list of discharge medications.  Relevant Imaging Results:  Relevant Lab Results:   Additional Information SS#: [id number]  Geralynn Ochs, LCSW

## 2021-03-16 NOTE — Plan of Care (Signed)
  Problem: Activity: Goal: Risk for activity intolerance will decrease Outcome: Progressing   Problem: Coping: Goal: Level of anxiety will decrease Outcome: Progressing   Problem: Safety: Goal: Ability to remain free from injury will improve Outcome: Progressing   

## 2021-03-16 NOTE — Progress Notes (Signed)
Was unable to complete NIHSS this am because refused and shook his head "no" when I got the interpreter phone to call interpreter, and pushed the phone away.  Does not appear to be in distress.

## 2021-03-16 NOTE — Progress Notes (Signed)
Inpatient Rehabilitation Admissions Coordinator   Inpatient rehab consult received. I met with patient's son, Joya Gaskins and patient's wife in [facility]. I discussed goals and expectations of a possible Cir admit. Manish states that he and his wife work and that his Mom is unable to give physical care of her spouse when they work. Patient projected to need min assist at least even after a CIR admit and wife unable to provide that level. Son therefore is requesting SNF level rehab. I will notify acute team and TOC and we will sign off at this time.  Danne Baxter, RN, MSN Rehab Admissions Coordinator [phone] 03/16/2021 1:21 PM

## 2021-03-17 LAB — BASIC METABOLIC PANEL
Anion gap: 6 (ref 5–15)
BUN: 32 mg/dL — ABNORMAL HIGH (ref 8–23)
CO2: 25 mmol/L (ref 22–32)
Calcium: 7.6 mg/dL — ABNORMAL LOW (ref 8.9–10.3)
Chloride: 106 mmol/L (ref 98–111)
Creatinine, Ser: 2.37 mg/dL — ABNORMAL HIGH (ref 0.61–1.24)
GFR, Estimated: 27 mL/min — ABNORMAL LOW (ref >60)
Glucose, Bld: 170 mg/dL — ABNORMAL HIGH (ref 70–99)
Potassium: 3.7 mmol/L (ref 3.5–5.1)
Sodium: 137 mmol/L (ref 135–145)

## 2021-03-17 LAB — CBC
HCT: 29.2 % — ABNORMAL LOW (ref 39.0–52.0)
Hemoglobin: 9.6 g/dL — ABNORMAL LOW (ref 13.0–17.0)
MCH: 26.2 pg (ref 26.0–34.0)
MCHC: 32.9 g/dL (ref 30.0–36.0)
MCV: 79.6 fL — ABNORMAL LOW (ref 80.0–100.0)
Platelets: 239 10*3/uL (ref 150–400)
RBC: 3.67 MIL/uL — ABNORMAL LOW (ref 4.22–5.81)
RDW: 25.3 % — ABNORMAL HIGH (ref 11.5–15.5)
WBC: 9.9 10*3/uL (ref 4.0–10.5)
nRBC: 0 % (ref 0.0–0.2)

## 2021-03-17 NOTE — Progress Notes (Signed)
PROGRESS NOTE        PATIENT DETAILS Name: Logan [name] Age: 86 y.o. Sex: male Date of Birth: 20-Sep-1934 Admit Date: 03/13/2021 Admitting Physician Greta Doom, MD [id number], Carloyn Manner, MD  Brief Narrative: Patient is a 86 y.o. 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  He was initially admitted by neurology to the [facility] subsequently transferred to [facility] on 9/18.  Subjective: Lying comfortably in bed-no major issues overnight.  Objective: Vitals: Blood pressure (!) 151/75, pulse 76, temperature 97.7 F (36.5 C), temperature source Oral, resp. rate 16, weight 64 kg, SpO2 100 %.   Exam: Gen Exam:Alert awake-not in any distress HEENT:atraumatic, normocephalic Chest: B/L clear to auscultation anteriorly CVS:S1S2 regular Abdomen:soft non tender, non distended Extremities:no edema Neurology: Chronic mild right-sided weakness persists. Skin: no rash   Pertinent Labs/Radiology: WBC: 9.9 Hb: 1 9.6 Creatinine: 2.37  Stroke work-up: LDL: 148 A1c: 5.3 9/16>> CT head: Left basal ganglia ICH 9/17>> Echo: EF 70-75%, RVSP 37.6 9/18>> carotid Doppler: No significant stenosis. 9/18>> CT head: Unchanged left basal ganglia infarct with surrounding edema.  Assessment/Plan: Left basal ganglia ICH: Secondary to hypertension-continue to optimize BP control-work-up as above-recommendations are for CIR on discharge.    History of prior CVA with chronic right-sided deficits  HTN: BP better this morning-continue Coreg, amlodipine.  Goal SBP <160.    HLD: Plan is to resume Lipitor on discharge.  Recent history of odynophagia due to herpes esophagitis: Completed a course of acyclovir.  Continue PPI twice daily dosing.  CKD stage IV: Creatinine close to baseline  Microcytic anemia:  Appears to have iron deficiency-Per most recent work-up.  Remains on iron supplementation.  Follow CBC periodically.  History of dementia: Appears mildly confused-but language barrier-he is able to follow simple commands when spoken in Hindi.  Procedures :None Consults:Neuro DVT Prophylaxis: SCD's Code Status:Full code  Family Communication: None at bedside  Time spent: 15 minutes-Greater than 50% of this time was spent in counseling, explanation of diagnosis, planning of further management, and coordination of care.  Diet: Diet Order             DIET DYS 3 Room service appropriate? Yes; Fluid consistency: Thin  Diet effective now                      Disposition Plan: Status is: Inpatient  Remains inpatient appropriate because:Inpatient level of care appropriate due to severity of illness  Dispo: The patient is from: Home              Anticipated d/c is to: SNF              Patient currently is medically stable to d/c.   Difficult to place patient No    Barriers to Discharge: Awaiting SNF bed.  Antimicrobial agents: Anti-infectives (From admission, onward)    None        MEDICATIONS: Scheduled Meds:   stroke: mapping our early stages of recovery book   Does not apply Once   amLODipine  10 mg Oral Daily   carvedilol  25 mg Oral BID WC   feeding supplement  237 mL Oral TID BM   ferrous sulfate  300 mg Oral Daily  multivitamin with minerals  1 tablet Oral Daily   pantoprazole sodium  40 mg Oral BID   senna-docusate  1 tablet Oral BID   Continuous Infusions: PRN Meds:.acetaminophen **OR** acetaminophen (TYLENOL) oral liquid 160 mg/5 mL **OR** acetaminophen, labetalol   I have personally reviewed following labs and imaging studies  LABORATORY DATA: CBC: Recent Labs  Lab 03/13/21 1610 03/14/21 0832 03/15/21 0156 03/16/21 0849 03/17/21 0254  WBC 11.1* 10.1 10.4 8.3 9.9  NEUTROABS 8.0*  --   --   --   --   HGB 12.6* 11.6* 11.0* 11.3* 9.6*  HCT  39.6 36.6* 33.4* 35.6* 29.2*  MCV 80.5 79.6* 78.0* 79.5* 79.6*  PLT 343 337 296 272 239     Basic Metabolic Panel: Recent Labs  Lab 03/13/21 1610 03/14/21 0832 03/15/21 0156 03/16/21 0631 03/17/21 0254  NA 140 139 139 139 137  K 3.8 3.8 3.8 3.7 3.7  CL 108 107 109 109 106  CO2 26 20* '23 23 25  '$ GLUCOSE 110* 95 83 90 170*  BUN '15 18 20 '$ 28* 32*  CREATININE 1.91* 2.21* 2.17* 2.34* 2.37*  CALCIUM 8.0* 7.8* 7.7* 7.6* 7.6*     GFR: Estimated Creatinine Clearance: 21.4 mL/min (A) (by C-G formula based on SCr of 2.37 mg/dL (H)).  Liver Function Tests: Recent Labs  Lab 03/13/21 1610  AST 29  ALT 19  ALKPHOS 79  BILITOT 0.4  PROT 4.9*  ALBUMIN 2.0*    Recent Labs  Lab 03/13/21 1610  LIPASE 27    No results for input(s): AMMONIA in the last 168 hours.  Coagulation Profile: Recent Labs  Lab 03/13/21 1728  INR 1.0     Cardiac Enzymes: No results for input(s): CKTOTAL, CKMB, CKMBINDEX, TROPONINI in the last 168 hours.  BNP (last 3 results) No results for input(s): PROBNP in the last 8760 hours.  Lipid Profile: No results for input(s): CHOL, HDL, LDLCALC, TRIG, CHOLHDL, LDLDIRECT in the last 72 hours.   Thyroid Function Tests: No results for input(s): TSH, T4TOTAL, FREET4, T3FREE, THYROIDAB in the last 72 hours.  Anemia Panel: No results for input(s): VITAMINB12, FOLATE, FERRITIN, TIBC, IRON, RETICCTPCT in the last 72 hours.  Urine analysis:    Component Value Date/Time   COLORURINE YELLOW (A) 03/13/2021 2017   APPEARANCEUR CLEAR (A) 03/13/2021 2017   LABSPEC 1.015 03/13/2021 2017   PHURINE 7.5 03/13/2021 2017   GLUCOSEU 250 (A) 03/13/2021 2017   HGBUR MODERATE (A) 03/13/2021 2017   BILIRUBINUR NEGATIVE 03/13/2021 2017   KETONESUR NEGATIVE 03/13/2021 2017   PROTEINUR >300 (A) 03/13/2021 2017   NITRITE NEGATIVE 03/13/2021 2017   LEUKOCYTESUR NEGATIVE 03/13/2021 2017    Sepsis Labs: Lactic Acid, Venous    Component Value Date/Time    LATICACIDVEN 1.1 02/10/2021 2214    MICROBIOLOGY: Recent Results (from the past 240 hour(s))  SARS CORONAVIRUS 2 (TAT 6-24 HRS) Nasopharyngeal Nasopharyngeal Swab     Status: None   Collection Time: 03/13/21  9:09 PM   Specimen: Nasopharyngeal Swab  Result Value Ref Range Status   SARS Coronavirus 2 NEGATIVE NEGATIVE Final    Comment: (NOTE) SARS-CoV-2 target nucleic acids are NOT DETECTED.  The SARS-CoV-2 RNA is generally detectable in upper and lower respiratory specimens during the acute phase of infection. Negative results do not preclude SARS-CoV-2 infection, do not rule out co-infections with other pathogens, and should not be used as the sole basis for treatment or other patient management decisions. Negative results must be combined with clinical observations, patient  history, and epidemiological information. The expected result is Negative.  Fact Sheet for Patients: SugarRoll.be  Fact Sheet for Healthcare Providers: https://www.woods-mathews.com/  This test is not yet approved or cleared by the [place] FDA and  has been authorized for detection and/or diagnosis of SARS-CoV-2 by FDA under an Emergency Use Authorization (EUA). This EUA will remain  in effect (meaning this test can be used) for the duration of the COVID-19 declaration under Se ction 564(b)(1) of the Act, 21 U.S.C. section 360bbb-3(b)(1), unless the authorization is terminated or revoked sooner.  Performed at [facility], [place] [address] [place], [place] [address]   MRSA Next Gen by PCR, Nasal     Status: None   Collection Time: 03/13/21 10:43 PM   Specimen: Nasal Mucosa; Nasal Swab  Result Value Ref Range Status   MRSA by PCR Next Gen NOT DETECTED NOT DETECTED Final    Comment: (NOTE) The GeneXpert MRSA Assay (FDA approved for NASAL specimens only), is one component of a comprehensive MRSA colonization surveillance program. It is not intended to  diagnose MRSA infection nor to guide or monitor treatment for MRSA infections. Test performance is not FDA approved in patients less than 82 years old. Performed at [facility], [place] [address], [place], [place] [address]     RADIOLOGY STUDIES/RESULTS: No results found.   LOS: 4 days   Oren Binet, MD  [facility]    To contact the attending provider between 7A-7P or the covering provider during after hours 7P-7A, please log into the web site [email or web address] and access using universal [place] password for that web site. If you do not have the password, please call the hospital operator.  03/17/2021, 2:41 PM

## 2021-03-17 NOTE — TOC Progression Note (Signed)
Transition of Care [facility]) - Progression Note    Patient Details  Name: Logan [name] MRN: [id number] Date of Birth: September 09, 1934  Transition of Care [facility]) CM/SW [place], [place] Phone Number: 03/17/2021, 4:23 PM  Clinical Narrative:   CSW spoke with patient's son to discuss SNF placement. CSW asked about vaccination status, and patient has had three covid vaccines, last booster was last October. CSW explained need for updated booster and family in agreement. CSW asked [facility] and [facility] to review, both declined. [facility] has offered a bed. CSW spoke with son and daughter in law to provide bed offer, they will check out [facility] and let CSW know if they want to choose. CSW to follow.    Expected Discharge Plan: [place] Barriers to Discharge: Continued Medical Work up, Ship broker  Expected Discharge Plan and Services Expected Discharge Plan: [place] arrangements for the past 2 months: Single Family Home                                       Social Determinants of Health (SDOH) Interventions    Readmission Risk Interventions No flowsheet data found.

## 2021-03-17 NOTE — TOC Initial Note (Signed)
Transition of Care [facility]) - Initial/Assessment Note    Patient Details  Name: Logan [name] MRN: [id number] Date of Birth: 03/06/1935  Transition of Care [facility]) CM/SW Contact:    Geralynn Ochs, LCSW Phone Number: 03/17/2021, 4:23 PM  Clinical Narrative:         CSW spoke with patient's son to discuss SNF placement. Son in agreement. CSW asked about SNF preferences and provided bed offers, and son would like CSW to find some other bed offers for the patient. Patient had been to [facility] in the past, and it was just OK. Family would like a better option if possible. CSW to research other SNF options and update family with bed offers.          Expected Discharge Plan: Skilled Nursing Facility Barriers to Discharge: Continued Medical Work up, Ship broker   Patient Goals and CMS Choice Patient states their goals for this hospitalization and ongoing recovery are:: patient unable to participate in goal setting, only oriented to self CMS Medicare.gov Compare Post Acute Care list provided to:: Patient Represenative (must comment) Choice offered to / list presented to : Adult Children  Expected Discharge Plan and Services Expected Discharge Plan: [place] arrangements for the past 2 months: Single Family Home                                      Prior Living Arrangements/Services Living arrangements for the past 2 months: Single Family Home Lives with:: Adult Children Patient language and need for interpreter reviewed:: No Do you feel safe going back to the place where you live?: Yes      Need for Family Participation in Patient Care: Yes (Comment) Care giver support system in place?: No (comment)   Criminal Activity/Legal Involvement Pertinent to Current Situation/Hospitalization: No - Comment as needed  Activities of Daily Living      Permission Sought/Granted Permission sought to share information with : Family  Supports Permission granted to share information with : Yes, Verbal Permission Granted  Share Information with NAME: Manish, Rupal  Permission granted to share info w AGENCY: SNF  Permission granted to share info w Relationship: Son, DIL     Emotional Assessment   Attitude/Demeanor/Rapport: Unable to Assess Affect (typically observed): Unable to Assess Orientation: : Oriented to Self Alcohol / Substance Use: Not Applicable Psych Involvement: No (comment)  Admission diagnosis:  Brain bleed ([place]) [I61.9] Nontraumatic thalamic hemorrhage ([place]) [I61.9] Hypertension, unspecified type [I10] ICH (intracerebral hemorrhage) ([place]) [I61.9] Patient Active Problem List   Diagnosis Date Noted   Nontraumatic thalamic hemorrhage ([place]) 03/13/2021   ICH (intracerebral hemorrhage) ([place]) 03/13/2021   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   Failure to thrive in adult 02/26/2021   Proteinuria 02/12/2021   Intractable abdominal pain 02/11/2021   AKI (acute kidney injury) ([place]) 02/11/2021   Anemia 02/11/2021   CKD (chronic kidney disease), stage III ([place]) 02/11/2021   Benign essential HTN 02/11/2021   Acute on chronic renal insufficiency    Hypoalbuminemia    Acute systolic CHF (congestive heart failure) (HCC)    S/P total right hip arthroplasty 04/30/2020   Abnormal CT scan, esophagus    Acute upper GI bleed 05/06/2017   Hyperkalemia 05/06/2017   Malnutrition of moderate degree 04/13/2017   Gastroesophageal reflux disease with esophagitis    Hyperglycemia 04/11/2017   Malnutrition of mild degree ([place])  04/11/2017   Intractable vomiting with nausea    Physical deconditioning    Iron deficiency anemia    Upper GI bleed    Esophageal ulcer with bleeding    Cardiomegaly    Chronic diastolic CHF (congestive heart failure) (HCC)    Intractable hiccups    Cerebrovascular accident (CVA) ([place])    SVT (supraventricular tachycardia) ([place])    Hematemesis 03/03/2017   PCP:  Jilda Panda,  MD Pharmacy:   [place], [place] [facility] [address] [place] [address] Phone: [phone] Fax: [phone]     Social Determinants of Health (SDOH) Interventions    Readmission Risk Interventions No flowsheet data found.

## 2021-03-17 NOTE — Progress Notes (Signed)
Speech Language Pathology Treatment: Dysphagia  Patient Details Name: Logan [name] MRN: [id number] DOB: 04-Mar-1935 Today's Date: 03/17/2021 Time: [id number] SLP Time Calculation (min) (ACUTE ONLY): 30 min  Assessment / Plan / Recommendation Clinical Impression  Pt seen for skilled ST intervention targeting goals for diet tolerance and expression of wants and needs. No family present today, and audio-interpreter has not been beneficial. Pt was sleeping upon arrival of SLP. He awakened easily and smiled. Pt accepted repositioning to upright, and accepted boluses of potatoes, grits, and banana. These solid textures were tolerated with timely oral prep and clearing, and no overt s/s aspiration. OJ was consumed via straw, and did not elicit overt s/s aspiration. Pt appeared to be more impulsive with milk, and exhibited reflexive cough x2.  Pt was also noted to belch frequently during this meal, so upright position is recommended following meals to facilitate esophageal clearing. Safe swallow precautions posted at [facility] and discussed with RN.  Pt was able to nonverbally communicate that he had had enough food to eat, and that he wanted some milk to drink.    HPI HPI: Patient is an 86 y.o. male admitted 03/13/21 with vomiting blood, elevated BP, increased weakness of R side. CT revealed basal ganglia hemorrhage. PMh includes: dementia, herpes esophagitis, hyperlipidema, previous CVA with R sided weakness. He was recently admitted 9/2-03/03/2021 with epigastric abdominal pain, nausea, vomiting, FTT and admitted with CHF and acute on chronic kidney injury.      SLP Plan  Continue with current plan of care      Recommendations for follow up therapy are one component of a multi-disciplinary discharge planning process, led by the attending physician.  Recommendations may be updated based on patient status, additional functional criteria and insurance authorization.    Recommendations  Diet recommendations:  Dysphagia 3 (mechanical soft);Thin liquid Liquids provided via: Cup;Straw Medication Administration: Whole meds with puree Supervision: Patient able to self feed;Full supervision/cueing for compensatory strategies;Staff to assist with self feeding Compensations: Minimize environmental distractions;Slow rate;Small sips/bites;Follow solids with liquid Postural Changes and/or Swallow Maneuvers: Seated upright 90 degrees;Upright 30-60 min after meal                Oral Care Recommendations: Oral care BID;Staff/trained caregiver to provide oral care Follow up Recommendations: 24 hour supervision/assistance;Skilled Nursing facility;Home health SLP SLP Visit Diagnosis: Dysphagia, unspecified (R13.10) Attention and concentration deficit following: Cerebral infarction Plan: Continue with current plan of care       GO              Leitha Hyppolite B. Quentin Ore, [facility], [place] Speech Language Pathologist Office: [phone]  Shonna Chock  03/17/2021, 11:56 AM

## 2021-03-18 LAB — SARS CORONAVIRUS 2 (TAT 6-24 HRS): SARS Coronavirus 2: NEGATIVE

## 2021-03-18 MED ORDER — COVID-19MRNA BIVAL VACC PFIZER 30 MCG/0.3ML IM SUSP
0.3000 mL | Freq: Once | INTRAMUSCULAR | Status: AC
  Administered 2021-03-18: 0.3 mL via INTRAMUSCULAR
  Filled 2021-03-18: qty 0.3

## 2021-03-18 NOTE — Progress Notes (Signed)
Occupational Therapy Treatment Patient Details Name: Logan [name] MRN: [id number] DOB: Nov 01, 1934 Today's Date: 03/18/2021   History of present illness Pt is an 86 y/o male admitted 03/13/21 with vomiting blood, elevated BP, increased weakness of R side. CT revealed basal ganglia hemorrhage. PMH significant for dementia, herpes esophagitis, hyperlipidema, previous CVA with R sided weakness.   OT comments  Pt is making functional progress in sitting ADL tasks and decreased assist level required for mobility. Pt followed simple commands about 50% of the time throughout session with multimodial cueing, and appropriately responded to yes/no questions with increased time for processing. He required mod A +1 overall for 5x sit to stand and weight shifting in standing. Pt also demonstrated great ability to use bilateral UEs during functional sitting tasks. Pt continues to benefit from OT acutely. Continue to recommend CIR at d/c to progress pt towards mod I prior to d/c home.    Recommendations for follow up therapy are one component of a multi-disciplinary discharge planning process, led by the attending physician.  Recommendations may be updated based on patient status, additional functional criteria and insurance authorization.    Follow Up Recommendations  CIR    Equipment Recommendations  Wheelchair (measurements OT) (defer)       Precautions / Restrictions Precautions Precautions: Fall Precaution Comments: use interpreter Gujarati Restrictions Weight Bearing Restrictions: No       Mobility Bed Mobility Overal bed mobility: Needs Assistance Bed Mobility: Sit to Supine     Supine to sit: Min assist;HOB elevated     General bed mobility comments: pt in chair upon arrival and returned to chair    Transfers Overall transfer level: Needs assistance Equipment used: 1 person hand held assist Transfers: Sit to/from Stand Sit to Stand: Mod assist         General transfer  comment: min A for sit<>stand for physical lifting; mod A for physical assit for weight shifting and multimodial cueing    Balance Overall balance assessment: Needs assistance   Sitting balance-Leahy Scale: Fair Sitting balance - Comments: pt able to move BUE dynamicall while sitting un the chair with back un supported, no LOB.   Standing balance support: Bilateral upper extremity supported Standing balance-Leahy Scale: Poor Standing balance comment: UE support needed for standing and some external assist                           ADL either performed or assessed with clinical judgement   ADL Overall ADL's : Needs assistance/impaired Eating/Feeding: Supervision/ safety;Sitting Eating/Feeding Details (indicate cue type and reason): pt asked for his water and drank appriopriately throughout the session, pt with active cough after initial sip of water. Supervision for safety. Grooming: Sitting;Minimal assistance                               Functional mobility during ADLs: Moderate assistance;Cueing for safety;Cueing for sequencing General ADL Comments: Min A for static sit<>stand, mod A for weight shifting in standing      Cognition Arousal/Alertness: Awake/alert Behavior During Therapy: Flat affect Overall Cognitive Status: Difficult to assess Area of Impairment: Attention;Following commands;Problem solving                   Current Attention Level: Focused   Following Commands: Follows one step commands with increased time;Follows one step commands inconsistently Safety/Judgement: Decreased awareness of safety;Decreased awareness of deficits   Problem  Solving: Slow processing;Decreased initiation General Comments: Pt's cognition is difficult to assess, however he did follow simple commands 50% of the time throughout the session with significantly incrased time for processing and initiation. Pt also responded yes/no appropriately the majority of  the time for simple questions.        Exercises Other Exercises Other Exercises: brushed teeth seated EOB with mod cues and assist to complete longer than few seconds   Shoulder Instructions       General Comments no family present this session, utilized interpreter however pt with very limited verbal response throughout the session. Did state "I want to go home" several times.    Pertinent Vitals/ Pain       Pain Assessment: Faces Faces Pain Scale: No hurt Pain Intervention(s): Monitored during session   Frequency  Min 2X/week        Progress Toward Goals  OT Goals(current goals can now be found in the care plan section)  Progress towards OT goals: Progressing toward goals  Acute Rehab OT Goals Patient Stated Goal: i want to go home OT Goal Formulation: Patient unable to participate in goal setting Time For Goal Achievement: 03/28/21 Potential to Achieve Goals: Fair ADL Goals Pt Will Perform Grooming: with min guard assist;with caregiver independent in assisting;sitting Pt Will Perform Upper Body Dressing: with min assist;with caregiver independent in assisting;sitting Pt Will Perform Lower Body Dressing: with mod assist;sit to/from stand;with caregiver independent in assisting Pt Will Transfer to Toilet: with mod assist;stand pivot transfer;bedside commode Pt Will Perform Toileting - Clothing Manipulation and hygiene: with max assist;sitting/lateral leans Additional ADL Goal #1: Pt will follow one step commands 75% of the time Additional ADL Goal #2: Patient will demonstrate improved use of right hand with feeding task - able to use hands effectively to feed self.  Plan Discharge plan remains appropriate       AM-PAC OT "6 Clicks" Daily Activity     Outcome Measure   Help from another person eating meals?: A Little Help from another person taking care of personal grooming?: A Little Help from another person toileting, which includes using toliet, bedpan, or  urinal?: A Lot Help from another person bathing (including washing, rinsing, drying)?: A Lot Help from another person to put on and taking off regular upper body clothing?: A Little Help from another person to put on and taking off regular lower body clothing?: A Lot 6 Click Score: 15    End of Session Equipment Utilized During Treatment: Gait belt  OT Visit Diagnosis: Other abnormalities of gait and mobility (R26.89);Muscle weakness (generalized) (M62.81);Hemiplegia and hemiparesis;Other symptoms and signs involving cognitive function;Other symptoms and signs involving the nervous system (R29.898) Hemiplegia - Right/Left: Right   Activity Tolerance Patient tolerated treatment well   Patient Left in chair;with call bell/phone within reach;with chair alarm set   Nurse Communication Mobility status (pt location and assist level)        Time: [id number] OT Time Calculation (min): 21 min  Charges: OT General Charges $OT Visit: 1 Visit OT Treatments $Self Care/Home Management : 8-22 mins    Ramondo Dietze A Stephone Gum 03/18/2021, 3:53 PM

## 2021-03-18 NOTE — Progress Notes (Signed)
PROGRESS NOTE        PATIENT DETAILS Name: Logan [name] Age: 86 y.o. Sex: male Date of Birth: 1934-10-19 Admit Date: 03/13/2021 Admitting Physician Greta Doom, MD [id number], Carloyn Manner, MD  Brief Narrative: Patient is a 86 y.o. 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  He was initially admitted by neurology to the [place] [facility] subsequently transferred to [facility] on 9/18.  Subjective: Lying comfortably in bed-no major issues overnight.  Objective: Vitals: Blood pressure 128/67, pulse 70, temperature 98.4 F (36.9 C), temperature source Oral, resp. rate 18, weight 65.5 kg, SpO2 100 %.   Exam: Gen Exam:Alert awake-not in any distress HEENT:atraumatic, normocephalic Chest: B/L clear to auscultation anteriorly CVS:S1S2 regular Abdomen:soft non tender, non distended Extremities:no edema Neurology: Chronic right-sided mild weakness. Skin: no rash   Pertinent Labs/Radiology: WBC: 9.9 Hb: 1 9.6 Creatinine: 2.37  Stroke work-up: LDL: 148 A1c: 5.3 9/16>> CT head: Left basal ganglia ICH 9/17>> Echo: EF 70-75%, RVSP 37.6 9/18>> carotid Doppler: No significant stenosis. 9/18>> CT head: Unchanged left basal ganglia infarct with surrounding edema.  Assessment/Plan: Left basal ganglia ICH: Secondary to hypertension-neurologically-he appears unchanged-continues to have mild left-sided deficits.  Awaiting SNF bed.    History of prior CVA with chronic right-sided deficits  HTN: BP better this morning-continue Coreg, amlodipine.  Goal SBP <160.    HLD: Plan is to resume Lipitor on discharge.  Recent history of odynophagia due to herpes esophagitis: Completed a course of acyclovir.  Continue PPI twice daily dosing.  CKD stage IV: Creatinine close to baseline  Microcytic anemia: Appears  to have iron deficiency-Per most recent work-up.  Remains on iron supplementation.  Follow CBC periodically.  History of dementia: Appears mildly confused-but language barrier-he is able to follow simple commands when spoken in Hindi.  Other-spoke with social worker-patient needs a COVID booster prior to discharge to SNF-spoke with son-he is agreeable for the patient to get the COVID-19 bivalent vaccine from [employer].  Procedures :None Consults:Neuro DVT Prophylaxis: SCD's Code Status:Full code  Family Communication: None at bedside  Time spent: 15 minutes-Greater than 50% of this time was spent in counseling, explanation of diagnosis, planning of further management, and coordination of care.  Diet: Diet Order             DIET DYS 3 Room service appropriate? Yes; Fluid consistency: Thin  Diet effective now                      Disposition Plan: Status is: Inpatient  Remains inpatient appropriate because:Inpatient level of care appropriate due to severity of illness  Dispo: The patient is from: Home              Anticipated d/c is to: SNF              Patient currently is medically stable to d/c.   Difficult to place patient No    Barriers to Discharge: Awaiting SNF bed.  Antimicrobial agents: Anti-infectives (From admission, onward)    None        MEDICATIONS: Scheduled Meds:   stroke: mapping our early stages of recovery book   Does not apply Once   amLODipine  10 mg Oral Daily  carvedilol  25 mg Oral BID WC   COVID-19 mRNA bivalent vaccine (Pfizer)  0.3 mL Intramuscular ONCE-1600   feeding supplement  237 mL Oral TID BM   ferrous sulfate  300 mg Oral Daily   multivitamin with minerals  1 tablet Oral Daily   pantoprazole sodium  40 mg Oral BID   senna-docusate  1 tablet Oral BID   Continuous Infusions: PRN Meds:.acetaminophen **OR** acetaminophen (TYLENOL) oral liquid 160 mg/5 mL **OR** acetaminophen, labetalol   I have personally reviewed  following labs and imaging studies  LABORATORY DATA: CBC: Recent Labs  Lab 03/13/21 1610 03/14/21 0832 03/15/21 0156 03/16/21 0849 03/17/21 0254  WBC 11.1* 10.1 10.4 8.3 9.9  NEUTROABS 8.0*  --   --   --   --   HGB 12.6* 11.6* 11.0* 11.3* 9.6*  HCT 39.6 36.6* 33.4* 35.6* 29.2*  MCV 80.5 79.6* 78.0* 79.5* 79.6*  PLT 343 337 296 272 239     Basic Metabolic Panel: Recent Labs  Lab 03/13/21 1610 03/14/21 0832 03/15/21 0156 03/16/21 0631 03/17/21 0254  NA 140 139 139 139 137  K 3.8 3.8 3.8 3.7 3.7  CL 108 107 109 109 106  CO2 26 20* '23 23 25  '$ GLUCOSE 110* 95 83 90 170*  BUN '15 18 20 '$ 28* 32*  CREATININE 1.91* 2.21* 2.17* 2.34* 2.37*  CALCIUM 8.0* 7.8* 7.7* 7.6* 7.6*     GFR: Estimated Creatinine Clearance: 21.9 mL/min (A) (by C-G formula based on SCr of 2.37 mg/dL (H)).  Liver Function Tests: Recent Labs  Lab 03/13/21 1610  AST 29  ALT 19  ALKPHOS 79  BILITOT 0.4  PROT 4.9*  ALBUMIN 2.0*    Recent Labs  Lab 03/13/21 1610  LIPASE 27    No results for input(s): AMMONIA in the last 168 hours.  Coagulation Profile: Recent Labs  Lab 03/13/21 1728  INR 1.0     Cardiac Enzymes: No results for input(s): CKTOTAL, CKMB, CKMBINDEX, TROPONINI in the last 168 hours.  BNP (last 3 results) No results for input(s): PROBNP in the last 8760 hours.  Lipid Profile: No results for input(s): CHOL, HDL, LDLCALC, TRIG, CHOLHDL, LDLDIRECT in the last 72 hours.   Thyroid Function Tests: No results for input(s): TSH, T4TOTAL, FREET4, T3FREE, THYROIDAB in the last 72 hours.  Anemia Panel: No results for input(s): VITAMINB12, FOLATE, FERRITIN, TIBC, IRON, RETICCTPCT in the last 72 hours.  Urine analysis:    Component Value Date/Time   COLORURINE YELLOW (A) 03/13/2021 2017   APPEARANCEUR CLEAR (A) 03/13/2021 2017   LABSPEC 1.015 03/13/2021 2017   PHURINE 7.5 03/13/2021 2017   GLUCOSEU 250 (A) 03/13/2021 2017   HGBUR MODERATE (A) 03/13/2021 2017   BILIRUBINUR  NEGATIVE 03/13/2021 2017   KETONESUR NEGATIVE 03/13/2021 2017   PROTEINUR >300 (A) 03/13/2021 2017   NITRITE NEGATIVE 03/13/2021 2017   LEUKOCYTESUR NEGATIVE 03/13/2021 2017    Sepsis Labs: Lactic Acid, Venous    Component Value Date/Time   LATICACIDVEN 1.1 02/10/2021 2214    MICROBIOLOGY: Recent Results (from the past 240 hour(s))  SARS CORONAVIRUS 2 (TAT 6-24 HRS) Nasopharyngeal Nasopharyngeal Swab     Status: None   Collection Time: 03/13/21  9:09 PM   Specimen: Nasopharyngeal Swab  Result Value Ref Range Status   SARS Coronavirus 2 NEGATIVE NEGATIVE Final    Comment: (NOTE) SARS-CoV-2 target nucleic acids are NOT DETECTED.  The SARS-CoV-2 RNA is generally detectable in upper and lower respiratory specimens during the acute phase of infection.  Negative results do not preclude SARS-CoV-2 infection, do not rule out co-infections with other pathogens, and should not be used as the sole basis for treatment or other patient management decisions. Negative results must be combined with clinical observations, patient history, and epidemiological information. The expected result is Negative.  Fact Sheet for Patients: SugarRoll.be  Fact Sheet for Healthcare Providers: https://www.woods-mathews.com/  This test is not yet approved or cleared by the [place] FDA and  has been authorized for detection and/or diagnosis of SARS-CoV-2 by FDA under an Emergency Use Authorization (EUA). This EUA will remain  in effect (meaning this test can be used) for the duration of the COVID-19 declaration under Se ction 564(b)(1) of the Act, 21 U.S.C. section 360bbb-3(b)(1), unless the authorization is terminated or revoked sooner.  Performed at [facility], [place] [address], [place], [place] [address]   MRSA Next Gen by PCR, Nasal     Status: None   Collection Time: 03/13/21 10:43 PM   Specimen: Nasal Mucosa; Nasal Swab  Result Value Ref  Range Status   MRSA by PCR Next Gen NOT DETECTED NOT DETECTED Final    Comment: (NOTE) The GeneXpert MRSA Assay (FDA approved for NASAL specimens only), is one component of a comprehensive MRSA colonization surveillance program. It is not intended to diagnose MRSA infection nor to guide or monitor treatment for MRSA infections. Test performance is not FDA approved in patients less than 57 years old. Performed at [facility], [place] [address], [place], [place] [address]     RADIOLOGY STUDIES/RESULTS: No results found.   LOS: 5 days   Oren Binet, MD  [facility]    To contact the attending provider between 7A-7P or the covering provider during after hours 7P-7A, please log into the web site [email or web address] and access using universal [place] password for that web site. If you do not have the password, please call the hospital operator.  03/18/2021, 1:14 PM

## 2021-03-18 NOTE — Progress Notes (Signed)
Physical Therapy Treatment Patient Details Name: Logan [name] MRN: [id number] DOB: Mar 12, 1935 Today's Date: 03/18/2021   History of Present Illness Pt is an 86 y/o male admitted 03/13/21 with vomiting blood, elevated BP, increased weakness of R side. CT revealed basal ganglia hemorrhage. PMH significant for dementia, herpes esophagitis, hyperlipidema, previous CVA with R sided weakness.    PT Comments    Patient progressing well with balance, transfers and able to mobilize with +1 A.  Still very limited and slow with cognition and command recognition and follow through.  Feel he remains appropriate for SNF level rehab at d/c.  PT to follow up.      Recommendations for follow up therapy are one component of a multi-disciplinary discharge planning process, led by the attending physician.  Recommendations may be updated based on patient status, additional functional criteria and insurance authorization.  Follow Up Recommendations  SNF     Equipment Recommendations  None recommended by PT    Recommendations for Other Services       Precautions / Restrictions Precautions Precautions: Fall     Mobility  Bed Mobility Overal bed mobility: Needs Assistance Bed Mobility: Sit to Supine     Supine to sit: Min assist;HOB elevated     General bed mobility comments: guiding pt to move legs to EOB and pt pulled up on rail to sit    Transfers Overall transfer level: Needs assistance Equipment used: Rolling walker (2 wheeled) Transfers: Sit to/from Stand Sit to Stand: Mod assist;From elevated surface         General transfer comment: up to RW with some lifting help. pt able to take steps to turn, but needed increased time and max cues to take a step back as attempting to sit prior to backing up to chair  Ambulation/Gait                 Stairs             Wheelchair Mobility    Modified Rankin (Stroke Patients Only) Modified Rankin (Stroke Patients  Only) Pre-Morbid Rankin Score: Moderately severe disability Modified Rankin: Severe disability     Balance Overall balance assessment: Needs assistance   Sitting balance-Leahy Scale: Fair     Standing balance support: Bilateral upper extremity supported Standing balance-Leahy Scale: Poor Standing balance comment: UE support needed for standing and some external assist                            Cognition Arousal/Alertness: Awake/alert Behavior During Therapy: Flat affect Overall Cognitive Status: Impaired/Different from baseline Area of Impairment: Attention;Following commands;Problem solving                   Current Attention Level: Focused   Following Commands: Follows one step commands with increased time;Follows one step commands inconsistently Safety/Judgement: Decreased awareness of safety;Decreased awareness of deficits   Problem Solving: Slow processing;Decreased initiation;Requires verbal cues;Requires tactile cues        Exercises Other Exercises Other Exercises: brushed teeth seated EOB with mod cues and assist to complete longer than few seconds    General Comments General comments (skin integrity, edema, etc.): pt's nephew in the room conversing in pt's language and pt attending some, using video interpreter in pt's language noted pt very slow to respond and very limited verbal responses.      Pertinent Vitals/Pain Faces Pain Scale: No hurt    Home Living  Prior Function            PT Goals (current goals can now be found in the care plan section) Progress towards PT goals: Progressing toward goals    Frequency    Min 3X/week      PT Plan Current plan remains appropriate    Co-evaluation              AM-PAC PT "6 Clicks" Mobility   Outcome Measure  Help needed turning from your back to your side while in a flat bed without using bedrails?: A Little Help needed moving from lying on  your back to sitting on the side of a flat bed without using bedrails?: A Little Help needed moving to and from a bed to a chair (including a wheelchair)?: A Lot Help needed standing up from a chair using your arms (e.g., wheelchair or bedside chair)?: A Lot Help needed to walk in hospital room?: Total Help needed climbing 3-5 steps with a railing? : Total 6 Click Score: 12    End of Session Equipment Utilized During Treatment: Gait belt Activity Tolerance: Patient tolerated treatment well Patient left: in chair;with call bell/phone within reach;with chair alarm set;Other (comment) (OT in the room)   PT Visit Diagnosis: Muscle weakness (generalized) (M62.81);Other symptoms and signs involving the nervous system (R29.898);Other abnormalities of gait and mobility (R26.89)     Time: [id number] PT Time Calculation (min) (ACUTE ONLY): 24 min  Charges:  $Therapeutic Activity: 23-37 mins                     Magda Kiel, PT Acute Rehabilitation Services [id number] Office:[phone] 03/18/2021    Reginia Naas 03/18/2021, 2:48 PM

## 2021-03-18 NOTE — TOC Progression Note (Signed)
Transition of Care [facility]) - Progression Note    Patient Details  Name: Logan [name] MRN: [id number] Date of Birth: Aug 26, 1934  Transition [facility] Care [facility] CM/SW [place], [place] Phone Number: 03/18/2021, 10:51 AM  Clinical Narrative:   CSW requested insurance authorization to be submitted for admission to SNF. CSW spoke with son to confirm plan to discharge to SNF tomorrow pending bed availability. Son in agreement and appreciative of update. CSW to follow.    Expected Discharge Plan: [place] Barriers to Discharge: Continued Medical Work up, Ship broker  Expected Discharge Plan and Services Expected Discharge Plan: [place] arrangements for the past 2 months: Single Family Home                                       Social Determinants of Health (SDOH) Interventions    Readmission Risk Interventions No flowsheet data found.

## 2021-03-19 MED ORDER — HYDRALAZINE HCL 25 MG PO TABS: 25.0000 mg | ORAL_TABLET | Freq: Three times a day (TID) | ORAL | Status: AC

## 2021-03-19 MED ORDER — POLYETHYLENE GLYCOL 3350 17 G PO PACK
17.0000 g | PACK | Freq: Every day | ORAL | Status: DC
  Administered 2021-03-19: 17 g via ORAL
  Filled 2021-03-19: qty 1

## 2021-03-19 MED ORDER — ENSURE ENLIVE PO LIQD: 237.0000 mL | Freq: Three times a day (TID) | ORAL | 12 refills | Status: AC

## 2021-03-19 MED ORDER — CARVEDILOL 25 MG PO TABS: 25.0000 mg | ORAL_TABLET | Freq: Two times a day (BID) | ORAL | 0 refills | Status: AC

## 2021-03-19 MED ORDER — HYDRALAZINE HCL 25 MG PO TABS
25.0000 mg | ORAL_TABLET | Freq: Three times a day (TID) | ORAL | Status: DC
  Administered 2021-03-19: 25 mg via ORAL
  Filled 2021-03-19: qty 1

## 2021-03-19 NOTE — TOC Transition Note (Signed)
Transition of Care [facility]) - CM/SW Discharge Note   Patient Details  Name: Logan [name] MRN: [id number] Date of Birth: [birth year]  [facility]) CM/SW Contact:  Pollie Friar, RN Phone Number: 03/19/2021, 10:58 AM   Clinical Narrative:    Patient is discharging to [place] today for rehab. Pt will transport via [facility]. Bedside RN updated and d/c packet is at the desk.   Number for report: [phone]   Final next level of care: Skilled Nursing Facility Barriers to Discharge: No Barriers Identified   Patient Goals and CMS Choice Patient states their goals for this hospitalization and ongoing recovery are:: patient unable to participate in goal setting, only oriented to self CMS Medicare.gov Compare Post Acute Care list provided to:: Patient Represenative (must comment) Choice offered to / list presented to : Adult Children  Discharge Placement              Patient [facility] bed at: [facility] Patient to be transferred to facility by: [place] Name of family member notified: Son: Manish Patient and family notified of of transfer: 03/19/21  Discharge Plan and Services                                     Social Determinants of Health (SDOH) Interventions     Readmission Risk Interventions No flowsheet data found.

## 2021-03-19 NOTE — Discharge Summary (Signed)
PATIENT DETAILS Name: Logan [name] Age: 86 y.o. Sex: male Date of Birth: Dec 01, 1934 MRN: [id number]. Admitting Physician: Greta Doom, MD [id number], Carloyn Manner, MD  Admit Date: 03/13/2021 Discharge date: 03/19/2021  Recommendations for Outpatient Follow-up:  Follow up with PCP in 1-2 weeks Please obtain CMP/CBC in one week Continue to optimize antihypertensive regimen Ensure follow-up with neurology/stroke clinic.  Admitted From:  Home  Disposition: SNF   Home Health: No  Equipment/Devices: None  Discharge Condition: Stable  CODE STATUS: FULL CODE  Diet recommendation:  Diet Order             Diet - low sodium heart healthy           DIET DYS 3 Room service appropriate? Yes; Fluid consistency: Thin  Diet effective now                    Brief Summary: Patient is a 86 y.o. 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  He was initially admitted by neurology to the [facility] subsequently transferred to [facility] on 9/18.  Stroke work-up: LDL: 148 A1c: 5.3 9/16>> CT head: Left basal ganglia ICH 9/17>> Echo: EF 70-75%, RVSP 37.6 9/18>> carotid Doppler: No significant stenosis. 9/18>> CT head: Unchanged left basal ganglia infarct with surrounding edema.  Brief Hospital Course: Left basal ganglia ICH: Secondary to hypertension-neurologically-he appears unchanged-continues to have mild left-sided deficits.  Stable to be transferred to SNF on discharge.   History of prior CVA with chronic right-sided deficits   HTN: BP on the higher side-continue Coreg/amlodipine-added hydralazine.  Continue to optimize in the outpatient setting-goal SBP less than 160   HLD: Plan is to resume Lipitor on discharge.   Recent history of odynophagia due to herpes esophagitis: Completed a course of acyclovir.   Continue PPI twice daily dosing.   CKD stage IV: Creatinine close to baseline   Microcytic anemia: Appears to have iron deficiency-Per most recent work-up.  Remains on iron supplementation.  Follow CBC periodically.   History of dementia: Appears mildly confused-but language barrier-he is able to follow simple commands when spoken in Hindi.   Procedures None  Discharge Diagnoses:  Active Problems:   Nontraumatic thalamic hemorrhage (HCC)   ICH (intracerebral hemorrhage) ([place])   Discharge Instructions:  Activity:  As tolerated   Discharge Instructions     Ambulatory referral to Neurology   Complete by: As directed    Follow up with stroke clinic NP (Jessica Vanschaick or Cecille Rubin, if both not available, consider Zachery Dauer, or Ahern) at [facility] in about 4 weeks. Thanks.   Call MD for:  difficulty breathing, headache or visual disturbances   Complete by: As directed    Call MD for:  extreme fatigue   Complete by: As directed    Call MD for:  persistant dizziness or light-headedness   Complete by: As directed    Diet - low sodium heart healthy   Complete by: As directed    Discharge instructions   Complete by: As directed    Follow with Primary MD  Jilda Panda, MD in 1-2 weeks  Please get a complete blood count and chemistry panel checked by your Primary MD at your next visit, and again as instructed by your Primary MD.  Get Medicines reviewed and adjusted: Please take all your medications with you for your next visit  with your Primary MD  Laboratory/radiological data: Please request your Primary MD to go over all hospital tests and procedure/radiological results at the follow up, please ask your Primary MD to get all Hospital records sent to his/her office.  In some cases, they will be blood work, cultures and biopsy results pending at the time of your discharge. Please request that your primary care M.D. follows up on these results.  Also Note the  following: If you experience worsening of your admission symptoms, develop shortness of breath, life threatening emergency, suicidal or homicidal thoughts you must seek medical attention immediately by calling 911 or calling your MD immediately  if symptoms less severe.  You must read complete instructions/literature along with all the possible adverse reactions/side effects for all the Medicines you take and that have been prescribed to you. Take any new Medicines after you have completely understood and accpet all the possible adverse reactions/side effects.   Do not drive when taking Pain medications or sleeping medications (Benzodaizepines)  Do not take more than prescribed Pain, Sleep and Anxiety Medications. It is not advisable to combine anxiety,sleep and pain medications without talking with your primary care practitioner  Special Instructions: If you have smoked or chewed Tobacco  in the last 2 yrs please stop smoking, stop any regular Alcohol  and or any Recreational drug use.  Wear Seat belts while driving.  Please note: You were cared for by a hospitalist during your hospital stay. Once you are discharged, your primary care physician will handle any further medical issues. Please note that NO REFILLS for any discharge medications will be authorized once you are discharged, as it is imperative that you return to your primary care physician (or establish a relationship with a primary care physician if you do not have one) for your post hospital discharge needs so that they can reassess your need for medications and monitor your lab values.   Increase activity slowly   Complete by: As directed       Allergies as of 03/19/2021   No Known Allergies      Medication List     STOP taking these medications    acyclovir 200 MG/5ML suspension Commonly known as: ZOVIRAX   lidocaine 2 % solution Commonly known as: XYLOCAINE       TAKE these medications    acetaminophen 500 MG  tablet Commonly known as: TYLENOL Take 1,000 mg by mouth every 6 (six) hours as needed for mild pain.   amLODipine 10 MG tablet Commonly known as: NORVASC Take 1 tablet (10 mg total) by mouth daily.   atorvastatin 20 MG tablet Commonly known as: LIPITOR Take 20 mg by mouth daily.   carvedilol 25 MG tablet Commonly known as: COREG Take 1 tablet (25 mg total) by mouth 2 (two) times daily. What changed:  medication strength how much to take   feeding supplement Liqd Take 237 mLs by mouth 3 (three) times daily between meals. What changed: when to take this   ferrous sulfate 300 (60 Fe) MG/5ML syrup Take 5 mLs (300 mg total) by mouth daily.   hydrALAZINE 25 MG tablet Commonly known as: APRESOLINE Take 1 tablet (25 mg total) by mouth every 8 (eight) hours.   lactulose 10 GM/15ML solution Commonly known as: CHRONULAC Take 10 g by mouth daily as needed for moderate constipation.   pantoprazole 40 MG tablet Commonly known as: PROTONIX Take 1 tablet (40 mg total) 2 (two) times daily by mouth. What changed: when to  take this        Follow-up Information     [facility]. Schedule an appointment as soon as possible for a visit in 1 month(s).   Specialty: Neurology Why: stroke clinic Contact information: [address] [place] [phone]        Jilda Panda, MD. Schedule an appointment as soon as possible for a visit in 1 week(s).   Specialty: Internal Medicine Contact information: [address] [place] [address] [phone]                No Known Allergies  Consultations:  neurology   Other Procedures/Studies: CT ABDOMEN PELVIS WO CONTRAST  Result Date: 02/26/2021 CLINICAL DATA:  Weakness, weight loss and poor oral intake. EXAM: CT ABDOMEN AND PELVIS WITHOUT CONTRAST TECHNIQUE: Multidetector CT imaging of the abdomen and pelvis was performed following the standard protocol without IV  contrast. COMPARISON:  February 10, 2021 FINDINGS: Lower chest: Mild to moderate severity areas of atelectasis and/or infiltrate are seen within the bilateral lung bases. Small bilateral pleural effusions are seen. A small pericardial effusion is present. Hepatobiliary: No focal liver abnormality is seen. No gallstones, gallbladder wall thickening, or biliary dilatation. Pancreas: Unremarkable. No pancreatic ductal dilatation or surrounding inflammatory changes. Spleen: Normal in size without focal abnormality. Adrenals/Urinary Tract: Adrenal glands are unremarkable. Kidneys are normal, without renal calculi, focal lesion, or hydronephrosis. Bladder is unremarkable. Stomach/Bowel: Stomach is within normal limits. Appendix appears normal. No evidence of bowel wall thickening, distention, or inflammatory changes. Vascular/Lymphatic: Aortic atherosclerosis. No enlarged abdominal or pelvic lymph nodes. Reproductive: The prostate gland is mildly enlarged. Other: No abdominal wall hernia or abnormality. No abdominopelvic ascites. Musculoskeletal: A total right hip replacement is seen with associated streak artifact and subsequently limited evaluation of the adjacent osseous and soft tissue structures. Degenerative changes seen throughout the lumbar spine. IMPRESSION: 1. Mild to moderate severity bibasilar atelectasis and/or infiltrate with small bilateral pleural effusions. 2. Small pericardial effusion. 3. Mildly enlarged prostate gland. 4. Total right hip replacement. 5. Aortic atherosclerosis. Aortic Atherosclerosis (ICD10-I70.0). Electronically Signed   By: Virgina Norfolk M.D.   On: 02/26/2021 18:35   CT HEAD WO CONTRAST (5MM)  Result Date: 03/15/2021 CLINICAL DATA:  86 year old male for follow-up of LEFT basal ganglia hemorrhage. EXAM: CT HEAD WITHOUT CONTRAST TECHNIQUE: Contiguous axial images were obtained from the base of the skull through the vertex without intravenous contrast. COMPARISON:  03/13/2021 CT  and prior studies FINDINGS: Brain: LEFT basal ganglia hemorrhage with surrounding edema is unchanged. No new hemorrhage is present. Atrophy, chronic small-vessel white matter ischemic changes, and remote basal ganglia infarcts again noted. No new abnormalities are present. Vascular: Carotid atherosclerotic calcifications are noted. Skull: Normal. Negative for fracture or focal lesion. Sinuses/Orbits: No acute finding. Other: None IMPRESSION: 1. Unchanged LEFT basal ganglia hemorrhage with surrounding edema. No new hemorrhage. 2. Atrophy, chronic small-vessel white matter ischemic changes and remote basal ganglia infarcts. Electronically Signed   By: Margarette Canada M.D.   On: 03/15/2021 14:39   CT HEAD WO CONTRAST (5MM)  Result Date: 03/13/2021 CLINICAL DATA:  Hemorrhage follow-up EXAM: CT HEAD WITHOUT CONTRAST TECHNIQUE: Contiguous axial images were obtained from the base of the skull through the vertex without intravenous contrast. COMPARISON:  03/13/2021 FINDINGS: Brain: Unchanged focus of intraparenchymal hemorrhage centered in the left basal ganglia with surrounding edema. There is periventricular hypoattenuation compatible with chronic microvascular disease. No midline shift or other mass effect. Vascular: No abnormal hyperdensity of the  major intracranial arteries or dural venous sinuses. No intracranial atherosclerosis. Skull: The visualized skull base, calvarium and extracranial soft tissues are normal. Sinuses/Orbits: No fluid levels or advanced mucosal thickening of the visualized paranasal sinuses. No mastoid or middle ear effusion. The orbits are normal. IMPRESSION: Unchanged focus of intraparenchymal hemorrhage centered in the left basal ganglia with surrounding edema. Electronically Signed   By: Ulyses Jarred M.D.   On: 03/13/2021 21:43   CT HEAD WO CONTRAST (5MM)  Result Date: 03/13/2021 CLINICAL DATA:  Altered mental status EXAM: CT HEAD WITHOUT CONTRAST TECHNIQUE: Contiguous axial images were  obtained from the base of the skull through the vertex without intravenous contrast. COMPARISON:  CT head 02/26/2021 FINDINGS: Brain: There is an acute left thalamic intraparenchymal hematoma measuring 1.7 cm x 1.1 cm by 1.5 cm. There is surrounding edema without resulting mass effect or midline shift. There is no intraventricular extension. No other acute intracranial hemorrhage is identified. There is global parenchymal volume loss with commensurate enlargement of the ventricular system. The ventricles are stable in size compared to the prior CT. Confluent hypodensity throughout the subcortical and periventricular white matter likely reflecting sequela of chronic white matter microangiopathy. Remote lacunar infarcts are seen in the bilateral basal ganglia. No mass lesion is identified. Vascular: There is calcification of the bilateral cavernous ICAs. Skull: Normal. Negative for fracture or focal lesion. Sinuses/Orbits: The imaged paranasal sinuses are clear. Bilateral lens implants are in place. The globes and orbits are otherwise unremarkable. Other: None. IMPRESSION: Acute left thalamic intraparenchymal hematoma with surrounding edema but no midline shift. These results were called by telephone at the time of interpretation on 03/13/2021 at 4:59 pm to provider Dr Dina Rich, who verbally acknowledged these results. Electronically Signed   By: Valetta Mole M.D.   On: 03/13/2021 17:02   CT HEAD WO CONTRAST (5MM)  Result Date: 02/26/2021 CLINICAL DATA:  Weakness, poor oral intake and 10 pound weight loss in 2 days. EXAM: CT HEAD WITHOUT CONTRAST TECHNIQUE: Contiguous axial images were obtained from the base of the skull through the vertex without intravenous contrast. COMPARISON:  None. FINDINGS: Brain: There is mild to moderate severity cerebral atrophy with widening of the extra-axial spaces and ventricular dilatation. There are areas of decreased attenuation within the white matter tracts of the supratentorial  brain, consistent with microvascular disease changes. Chronic bilateral basal ganglia lacunar infarcts are noted. Vascular: No hyperdense vessel or unexpected calcification. Skull: Normal. Negative for fracture or focal lesion. Sinuses/Orbits: No acute finding. 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 IMPRESSION: 1. Generalized cerebral atrophy. 2. Chronic bilateral basal ganglia lacunar infarcts. 3. No acute intracranial abnormality. Electronically Signed   By: Virgina Norfolk M.D.   On: 02/26/2021 18:37   ECHOCARDIOGRAM COMPLETE  Result Date: 03/14/2021    ECHOCARDIOGRAM REPORT   Patient Name:   [facility] Date of Exam: 03/14/2021 Medical Rec #:  [id number]     Height:       69.0 in Accession #:    [id number]    Weight:       141.1 lb Date of Birth:  Dec 30, 1934    BSA:          1.781 m Patient Age:    31 years      BP:           125/98 mmHg Patient Gender: M             HR:  110 bpm. Exam Location:  Inpatient Procedure: 2D Echo, Cardiac Doppler and Color Doppler Indications:    Stroke I63.9  History:        Patient has prior history of Echocardiogram examinations, most                 recent 03/05/2017. CHF, Stroke; Risk Factors:Non-Smoker and                 Hypertension. SVT.  Sonographer:    Leavy Cella RDCS Referring Phys: [phone] MCNEILL P KIRKPATRICK  Sonographer Comments: Technically difficult study due to poor echo windows. IMPRESSIONS  1. Left ventricular ejection fraction, by estimation, is 70 to 75%. The left ventricle has hyperdynamic function. The left ventricle has no regional wall motion abnormalities. There is mild asymmetric left ventricular hypertrophy of the basal-septal segment. Indeterminate diastolic filling due to E-A fusion.  2. Right ventricular systolic function is normal. The right ventricular size is normal. There is mildly elevated pulmonary artery systolic pressure. The estimated right ventricular systolic pressure is 123456 mmHg.  3. A small  pericardial effusion is present. The pericardial effusion is circumferential.  4. The mitral valve is grossly normal. No evidence of mitral valve regurgitation. No evidence of mitral stenosis.  5. Aortic regurgitation appears trivial. Color scale inappropriately adjusted during this study. The aortic valve is tricuspid. There is mild calcification of the aortic valve. Aortic valve regurgitation is trivial. Mild to moderate aortic valve sclerosis/calcification is present, without any evidence of aortic stenosis.  6. The inferior vena cava is normal in size with greater than 50% respiratory variability, suggesting right atrial pressure of 3 mmHg. Conclusion(s)/Recommendation(s): No intracardiac source of embolism detected on this transthoracic study. A transesophageal echocardiogram is recommended to exclude cardiac source of embolism if clinically indicated. FINDINGS  Left Ventricle: Left ventricular ejection fraction, by estimation, is 70 to 75%. The left ventricle has hyperdynamic function. The left ventricle has no regional wall motion abnormalities. The left ventricular internal cavity size was normal in size. There is mild asymmetric left ventricular hypertrophy of the basal-septal segment. Indeterminate diastolic filling due to E-A fusion. Right Ventricle: The right ventricular size is normal. No increase in right ventricular wall thickness. Right ventricular systolic function is normal. There is mildly elevated pulmonary artery systolic pressure. The tricuspid regurgitant velocity is 2.94  m/s, and with an assumed right atrial pressure of 3 mmHg, the estimated right ventricular systolic pressure is 123456 mmHg. Left Atrium: Left atrial size was normal in size. Right Atrium: Right atrial size was normal in size. Pericardium: A small pericardial effusion is present. The pericardial effusion is circumferential. Mitral Valve: The mitral valve is grossly normal. No evidence of mitral valve regurgitation. No evidence  of mitral valve stenosis. Tricuspid Valve: The tricuspid valve is grossly normal. Tricuspid valve regurgitation is trivial. No evidence of tricuspid stenosis. Aortic Valve: Aortic regurgitation appears trivial. Color scale inappropriately adjusted during this study. The aortic valve is tricuspid. There is mild calcification of the aortic valve. Aortic valve regurgitation is trivial. Aortic regurgitation PHT measures 449 msec. Mild to moderate aortic valve sclerosis/calcification is present, without any evidence of aortic stenosis. Pulmonic Valve: The pulmonic valve was not well visualized. Pulmonic valve regurgitation is not visualized. Aorta: The aortic root was not well visualized and the ascending aorta was not well visualized. Venous: The inferior vena cava is normal in size with greater than 50% respiratory variability, suggesting right atrial pressure of 3 mmHg. IAS/Shunts: The atrial septum is grossly normal.  LEFT VENTRICLE PLAX 2D LVIDd:         3.90 cm LVIDs:         2.50 cm LV PW:         1.00 cm LV IVS:        1.30 cm LVOT diam:     2.10 cm LVOT Area:     3.46 cm  RIGHT VENTRICLE RV S prime:     12.50 cm/s TAPSE (M-mode): 1.0 cm LEFT ATRIUM             Index       RIGHT ATRIUM          Index LA diam:        3.20 cm 1.80 cm/m  RA Area:     7.58 cm LA Vol (A2C):   25.5 ml 14.32 ml/m RA Volume:   13.60 ml 7.63 ml/m LA Vol (A4C):   26.1 ml 14.65 ml/m LA Biplane Vol: 26.8 ml 15.05 ml/m  AORTIC VALVE AI PHT:      449 msec MITRAL VALVE                TRICUSPID VALVE MV Area (PHT): 5.42 cm     TR Peak grad:   34.6 mmHg MV Decel Time: 140 msec     TR Vmax:        294.00 cm/s MV E velocity: 110.00 cm/s                             SHUNTS                             Systemic Diam: 2.10 cm Eleonore Chiquito MD Electronically signed by Eleonore Chiquito MD Signature Date/Time: 03/14/2021/12:40:04 PM    Final    VAS US CAROTID  Result Date: 03/16/2021 Carotid Arterial Duplex Study Patient Name:  [facility]  Date  of Exam:   03/15/2021 Medical Rec #: [id number]      Accession #:    [id number] Date of Birth: 07/15/1934     Patient Gender: M Patient Age:   72 years Exam Location:  [facility] Procedure:      VAS US CAROTID Referring Phys: Rosalin Hawking --------------------------------------------------------------------------------  Indications:       CVA. Risk Factors:      Hyperlipidemia, Diabetes, prior CVA. Limitations        Today's exam was limited due to the patient's inability or                    unwillingness to cooperate. Comparison Study:  no prior Performing Technologist: Archie Patten RVS  Examination Guidelines: A complete evaluation includes B-mode imaging, spectral Doppler, color Doppler, and power Doppler as needed of all accessible portions of each vessel. Bilateral testing is considered an integral part of a complete examination. Limited examinations for reoccurring indications may be performed as noted.  Right Carotid Findings: +----------+--------+--------+--------+------------------+--------+           PSV cm/sEDV cm/sStenosisPlaque DescriptionComments +----------+--------+--------+--------+------------------+--------+ CCA Prox  68      11              heterogenous               +----------+--------+--------+--------+------------------+--------+ CCA Distal69      14              heterogenous               +----------+--------+--------+--------+------------------+--------+  ICA Prox  50      15      1-39%   heterogenous               +----------+--------+--------+--------+------------------+--------+ ICA Distal38      9                                          +----------+--------+--------+--------+------------------+--------+ ECA       76                                                 +----------+--------+--------+--------+------------------+--------+ +----------+--------+-------+--------+-------------------+           PSV cm/sEDV cmsDescribeArm  Pressure (mmHG) +----------+--------+-------+--------+-------------------+ Subclavian70                                         +----------+--------+-------+--------+-------------------+ +---------+--------+--+--------+--+---------+ VertebralPSV cm/s42EDV cm/s14Antegrade +---------+--------+--+--------+--+---------+  Left Carotid Findings: +----------+--------+--------+--------+------------------+--------+           PSV cm/sEDV cm/sStenosisPlaque DescriptionComments +----------+--------+--------+--------+------------------+--------+ CCA Prox  42      6               heterogenous               +----------+--------+--------+--------+------------------+--------+ CCA Distal48      16              heterogenous               +----------+--------+--------+--------+------------------+--------+ ICA Prox  51      16      1-39%   heterogenous               +----------+--------+--------+--------+------------------+--------+ ICA Distal36      8                                          +----------+--------+--------+--------+------------------+--------+ ECA       52                                                 +----------+--------+--------+--------+------------------+--------+ +----------+--------+--------+--------+-------------------+           PSV cm/sEDV cm/sDescribeArm Pressure (mmHG) +----------+--------+--------+--------+-------------------+ [id number]                                          +----------+--------+--------+--------+-------------------+ +---------+--------+--+--------+--+---------+ VertebralPSV cm/s44EDV cm/s12Antegrade +---------+--------+--+--------+--+---------+   Summary: Right Carotid: Velocities in the right ICA are consistent with a 1-39% stenosis. Left Carotid: Velocities in the left ICA are consistent with a 1-39% stenosis. Vertebrals: Bilateral vertebral arteries demonstrate antegrade flow. *See table(s) above for  measurements and observations.  Electronically signed by Antony Contras MD on 03/16/2021 at 9:15:58 AM.    Final    VAS [place] LOWER EXTREMITY VENOUS (DVT) (ONLY [facility] & [facility])  Result Date: 02/27/2021  Lower Venous DVT Study Patient Name:  [facility]  Date of Exam:  02/26/2021 Medical Rec #: [id number]      Accession #:    [id number] Date of Birth: 04/10/1935     Patient Gender: M Patient Age:   25 years Exam Location:  [facility] Procedure:      VAS [place] LOWER EXTREMITY VENOUS (DVT) Referring Phys: Gareth Morgan --------------------------------------------------------------------------------  Indications: Swelling.  Risk Factors: Cancer. Limitations: Poor ultrasound/tissue interface. Comparison Study: No prior studies. Performing Technologist: Oliver Hum RVT  Examination Guidelines: A complete evaluation includes B-mode imaging, spectral Doppler, color Doppler, and power Doppler as needed of all accessible portions of each vessel. Bilateral testing is considered an integral part of a complete examination. Limited examinations for reoccurring indications may be performed as noted. The reflux portion of the exam is performed with the patient in reverse Trendelenburg.  +---------+---------------+---------+-----------+----------+--------------+ RIGHT    CompressibilityPhasicitySpontaneityPropertiesThrombus Aging +---------+---------------+---------+-----------+----------+--------------+ CFV      Full           Yes      Yes                                 +---------+---------------+---------+-----------+----------+--------------+ SFJ      Full                                                        +---------+---------------+---------+-----------+----------+--------------+ FV Prox  Full                                                        +---------+---------------+---------+-----------+----------+--------------+ FV Mid   Full                                                         +---------+---------------+---------+-----------+----------+--------------+ FV DistalFull                                                        +---------+---------------+---------+-----------+----------+--------------+ PFV      Full                                                        +---------+---------------+---------+-----------+----------+--------------+ POP      Full           Yes      Yes                                 +---------+---------------+---------+-----------+----------+--------------+ PTV      Full                                                        +---------+---------------+---------+-----------+----------+--------------+  PERO     Full                                                        +---------+---------------+---------+-----------+----------+--------------+   +----+---------------+---------+-----------+----------+--------------+ LEFTCompressibilityPhasicitySpontaneityPropertiesThrombus Aging +----+---------------+---------+-----------+----------+--------------+ CFV Full           Yes      Yes                                 +----+---------------+---------+-----------+----------+--------------+     Summary: RIGHT: - There is no evidence of deep vein thrombosis in the lower extremity.  - No cystic structure found in the popliteal fossa.  LEFT: - No evidence of common femoral vein obstruction.  *See table(s) above for measurements and observations. Electronically signed by Jamelle Haring on 02/27/2021 at 7:46:50 AM.    Final    UE Venous Duplex ([facility] and [facility] ONLY)  Result Date: 02/27/2021 UPPER VENOUS STUDY  Patient Name:  [facility]  Date of Exam:   02/26/2021 Medical Rec #: [id number]      Accession #:    [id number] Date of Birth: 12/19/1934     Patient Gender: M Patient Age:   59 years Exam Location:  [facility] Procedure:      VAS [place] UPPER EXTREMITY VENOUS DUPLEX Referring Phys: Gareth Morgan  --------------------------------------------------------------------------------  Indications: Swelling Risk Factors: Cancer. Comparison Study: No prior studies. Performing Technologist: Oliver Hum RVT  Examination Guidelines: A complete evaluation includes B-mode imaging, spectral Doppler, color Doppler, and power Doppler as needed of all accessible portions of each vessel. Bilateral testing is considered an integral part of a complete examination. Limited examinations for reoccurring indications may be performed as noted.  Right Findings: +----------+------------+---------+-----------+----------+-------+ RIGHT     CompressiblePhasicitySpontaneousPropertiesSummary +----------+------------+---------+-----------+----------+-------+ IJV           Full       Yes       Yes                      +----------+------------+---------+-----------+----------+-------+ Subclavian    Full       Yes       Yes                      +----------+------------+---------+-----------+----------+-------+ Axillary      Full       Yes       Yes                      +----------+------------+---------+-----------+----------+-------+ Brachial      Full       Yes       Yes                      +----------+------------+---------+-----------+----------+-------+ Radial        Full                                          +----------+------------+---------+-----------+----------+-------+ Ulnar         Full                                          +----------+------------+---------+-----------+----------+-------+  Cephalic      Full                                          +----------+------------+---------+-----------+----------+-------+ Basilic       Full                                          +----------+------------+---------+-----------+----------+-------+  Left Findings: +----------+------------+---------+-----------+----------+-------+ LEFT       CompressiblePhasicitySpontaneousPropertiesSummary +----------+------------+---------+-----------+----------+-------+ Subclavian    Full       Yes       Yes                      +----------+------------+---------+-----------+----------+-------+  Summary:  Right: No evidence of deep vein thrombosis in the upper extremity. No evidence of superficial vein thrombosis in the upper extremity.  Left: No evidence of thrombosis in the subclavian.  *See table(s) above for measurements and observations.  Diagnosing physician: Jamelle Haring Electronically signed by Jamelle Haring on 02/27/2021 at 7:46:37 AM.    Final      TODAY-DAY OF DISCHARGE:  Subjective:   Logan [name] today has no headache,no chest abdominal pain,no new weakness tingling or numbness, feels much better wants to go home today.  Objective:   Blood pressure (!) 158/95, pulse 67, temperature 97.8 F (36.6 C), temperature source Oral, resp. rate 14, weight 66.8 kg, SpO2 100 %.  Intake/Output Summary (Last 24 hours) at 03/19/2021 1005 Last data filed at 03/19/2021 0530 Gross per 24 hour  Intake 717 ml  Output 1250 ml  Net -533 ml   Filed Weights   03/17/21 0416 03/18/21 0451 03/19/21 0522  Weight: 64 kg 65.5 kg 66.8 kg    Exam: Awake Alert, Oriented *3, No new F.N deficits, Normal affect .AT,PERRAL Supple Neck,No JVD, No cervical lymphadenopathy appriciated.  Symmetrical Chest wall movement, Good air movement bilaterally, CTAB RRR,No Gallops,Rubs or new Murmurs, No Parasternal Heave +ve B.Sounds, Abd Soft, Non tender, No organomegaly appriciated, No rebound -guarding or rigidity. No Cyanosis, Clubbing or edema, No new Rash or bruise   PERTINENT RADIOLOGIC STUDIES: No results found.   PERTINENT LAB RESULTS: CBC: Recent Labs    03/17/21 0254  WBC 9.9  HGB 9.6*  HCT 29.2*  PLT 239   CMET CMP     Component Value Date/Time   NA 137 03/17/2021 0254   K 3.7 03/17/2021 0254   CL 106 03/17/2021 0254   CO2  25 03/17/2021 0254   GLUCOSE 170 (H) 03/17/2021 0254   GLUCOSE 108 (H) 07/07/2006 0956   BUN 32 (H) 03/17/2021 0254   CREATININE 2.37 (H) 03/17/2021 0254   CALCIUM 7.6 (L) 03/17/2021 0254   PROT 4.9 (L) 03/13/2021 1610   ALBUMIN 2.0 (L) 03/13/2021 1610   AST 29 03/13/2021 1610   ALT 19 03/13/2021 1610   ALKPHOS 79 03/13/2021 1610   BILITOT 0.4 03/13/2021 1610   GFRNONAA 27 (L) 03/17/2021 0254   GFRAA 55 (L) 05/09/2017 1522    GFR Estimated Creatinine Clearance: 22.3 mL/min (A) (by C-G formula based on SCr of 2.37 mg/dL (H)). No results for input(s): LIPASE, AMYLASE in the last 72 hours. No results for input(s): CKTOTAL, CKMB, CKMBINDEX, TROPONINI in the last 72 hours. Invalid input(s): POCBNP No results for input(s): DDIMER in the last  72 hours. No results for input(s): HGBA1C in the last 72 hours. No results for input(s): CHOL, HDL, LDLCALC, TRIG, CHOLHDL, LDLDIRECT in the last 72 hours. No results for input(s): TSH, T4TOTAL, T3FREE, THYROIDAB in the last 72 hours.  Invalid input(s): FREET3 No results for input(s): VITAMINB12, FOLATE, FERRITIN, TIBC, IRON, RETICCTPCT in the last 72 hours. Coags: No results for input(s): INR in the last 72 hours.  Invalid input(s): PT Microbiology: Recent Results (from the past 240 hour(s))  SARS CORONAVIRUS 2 (TAT 6-24 HRS) Nasopharyngeal Nasopharyngeal Swab     Status: None   Collection Time: 03/13/21  9:09 PM   Specimen: Nasopharyngeal Swab  Result Value Ref Range Status   SARS Coronavirus 2 NEGATIVE NEGATIVE Final    Comment: (NOTE) SARS-CoV-2 target nucleic acids are NOT DETECTED.  The SARS-CoV-2 RNA is generally detectable in upper and lower respiratory specimens during the acute phase of infection. Negative results do not preclude SARS-CoV-2 infection, do not rule out co-infections with other pathogens, and should not be used as the sole basis for treatment or other patient management decisions. Negative results must be combined  with clinical observations, patient history, and epidemiological information. The expected result is Negative.  Fact Sheet for Patients: SugarRoll.be  Fact Sheet for Healthcare Providers: https://www.woods-mathews.com/  This test is not yet approved or cleared by the [place] FDA and  has been authorized for detection and/or diagnosis of SARS-CoV-2 by FDA under an Emergency Use Authorization (EUA). This EUA will remain  in effect (meaning this test can be used) for the duration of the COVID-19 declaration under Se ction 564(b)(1) of the Act, 21 U.S.C. section 360bbb-3(b)(1), unless the authorization is terminated or revoked sooner.  Performed at [facility], [place] [address] [place], [place] [address]   MRSA Next Gen by PCR, Nasal     Status: None   Collection Time: 03/13/21 10:43 PM   Specimen: Nasal Mucosa; Nasal Swab  Result Value Ref Range Status   MRSA by PCR Next Gen NOT DETECTED NOT DETECTED Final    Comment: (NOTE) The GeneXpert MRSA Assay (FDA approved for NASAL specimens only), is one component of a comprehensive MRSA colonization surveillance program. It is not intended to diagnose MRSA infection nor to guide or monitor treatment for MRSA infections. Test performance is not FDA approved in patients less than 25 years old. Performed at [facility], [place] [address], [place], [place] [address]   SARS CORONAVIRUS 2 (TAT 6-24 HRS) Nasopharyngeal Nasopharyngeal Swab     Status: None   Collection Time: 03/18/21 10:21 AM   Specimen: Nasopharyngeal Swab  Result Value Ref Range Status   SARS Coronavirus 2 NEGATIVE NEGATIVE Final    Comment: (NOTE) SARS-CoV-2 target nucleic acids are NOT DETECTED.  The SARS-CoV-2 RNA is generally detectable in upper and lower respiratory specimens during the acute phase of infection. Negative results do not preclude SARS-CoV-2 infection, do not rule out co-infections with other  pathogens, and should not be used as the sole basis for treatment or other patient management decisions. Negative results must be combined with clinical observations, patient history, and epidemiological information. The expected result is Negative.  Fact Sheet for Patients: SugarRoll.be  Fact Sheet for Healthcare Providers: https://www.woods-mathews.com/  This test is not yet approved or cleared by the [place] FDA and  has been authorized for detection and/or diagnosis of SARS-CoV-2 by FDA under an Emergency Use Authorization (EUA). This EUA will remain  in effect (meaning this test can  be used) for the duration of the COVID-19 declaration under Se ction 564(b)(1) of the Act, 21 U.S.C. section 360bbb-3(b)(1), unless the authorization is terminated or revoked sooner.  Performed at [facility], [place] [address] [place], [place] [address]     FURTHER DISCHARGE INSTRUCTIONS:  Get Medicines reviewed and adjusted: Please take all your medications with you for your next visit with your Primary MD  Laboratory/radiological data: Please request your Primary MD to go over all hospital tests and procedure/radiological results at the follow up, please ask your Primary MD to get all Hospital records sent to his/her office.  In some cases, they will be blood work, cultures and biopsy results pending at the time of your discharge. Please request that your primary care M.D. goes through all the records of your hospital data and follows up on these results.  Also Note the following: If you experience worsening of your admission symptoms, develop shortness of breath, life threatening emergency, suicidal or homicidal thoughts you must seek medical attention immediately by calling 911 or calling your MD immediately  if symptoms less severe.  You must read complete instructions/literature along with all the possible adverse reactions/side effects  for all the Medicines you take and that have been prescribed to you. Take any new Medicines after you have completely understood and accpet all the possible adverse reactions/side effects.   Do not drive when taking Pain medications or sleeping medications (Benzodaizepines)  Do not take more than prescribed Pain, Sleep and Anxiety Medications. It is not advisable to combine anxiety,sleep and pain medications without talking with your primary care practitioner  Special Instructions: If you have smoked or chewed Tobacco  in the last 2 yrs please stop smoking, stop any regular Alcohol  and or any Recreational drug use.  Wear Seat belts while driving.  Please note: You were cared for by a hospitalist during your hospital stay. Once you are discharged, your primary care physician will handle any further medical issues. Please note that NO REFILLS for any discharge medications will be authorized once you are discharged, as it is imperative that you return to your primary care physician (or establish a relationship with a primary care physician if you do not have one) for your post hospital discharge needs so that they can reassess your need for medications and monitor your lab values.  Total Time spent coordinating discharge including counseling, education and face to face time equals 35 minutes.  SignedOren Binet 03/19/2021 10:05 AM

## 2021-04-09 ENCOUNTER — Ambulatory Visit: Payer: Medicare Other | Admitting: Neurology

## 2021-04-15 ENCOUNTER — Ambulatory Visit: Payer: Medicare Other | Admitting: Neurology

## 2021-07-08 ENCOUNTER — Encounter: Payer: Self-pay | Admitting: Neurology

## 2021-07-08 ENCOUNTER — Ambulatory Visit (INDEPENDENT_AMBULATORY_CARE_PROVIDER_SITE_OTHER): Payer: Medicare Other | Admitting: Neurology

## 2021-07-08 VITALS — BP 136/73 | HR 67 | Ht 69.0 in

## 2021-07-08 DIAGNOSIS — F01B18 Vascular dementia, moderate, with other behavioral disturbance: Secondary | ICD-10-CM

## 2021-07-08 DIAGNOSIS — I610 Nontraumatic intracerebral hemorrhage in hemisphere, subcortical: Secondary | ICD-10-CM

## 2021-07-08 DIAGNOSIS — Z8673 Personal history of transient ischemic attack (TIA), and cerebral infarction without residual deficits: Secondary | ICD-10-CM | POA: Diagnosis not present

## 2021-07-08 MED ORDER — MEMANTINE HCL 28 X 5 MG & 21 X 10 MG PO TABS: ORAL_TABLET | ORAL | 12 refills | Status: DC

## 2021-07-08 NOTE — Patient Instructions (Addendum)
I had a long discussion the patient and his son regarding the recent intracerebral hemorrhage and dementia which clearly appears to have worsened and patient is now practically wheelchair-bound and not participating in therapies with very poor quality of life.  Family wishes to take him home and he may be appropriate for home hospice.  Recommend check dementia panel labs, EEG and repeat CT scan of the head to look for reversible etiologies.  Trial of Namenda to look for cognitive improvement.  Continue strict control of hypertension blood pressure goal below 140/90.  I have advised the patient's son to discussed with nursing home medical director home hospice.  Return for follow-up in the future only as necessary.

## 2021-07-08 NOTE — Progress Notes (Signed)
[facility] [address] [place]. [place] [address] [phone]       OFFICE CONSULT NOTE  [name] Date of Birth:  Dec 13, 1934 Medical Record Number:  [id number]   Referring MD: Jilda Panda  Reason for Referral: Intracerebral Hemorrhage  HPI: [name] is a pleasant 87 year old [place] male seen today for initial office consultation visit for intracerebral hemorrhage.  He is accompanied by his son and history is obtained from him and review of electronic medical records.  I personally reviewed available pertinent imaging films in PACS.  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  He presented on 03/13/2021 with sudden onset of right hemiparesis with NIH stroke scale of 9.  CT scan of the head showed subacute left basal ganglia hemorrhage with cytotoxic edema but no significant midline shift or intraventricular extension.  Patient had some agitation in the hospital requiring medications.  Blood pressure remained stable and follow-up imaging showed stable appearance of the hematoma.  He was transferred to inpatient rehab.  He did not do well and is currently transferred to skilled nursing facility where he still living.  Patient has unfortunately not made any significant improvement.  He is continues to have severe dysarthria and can be understood only with difficulty.  He has significant memory loss and cognitive impairment.  He is unable to even understand and participate in therapy and refuses therapy.  He has not been standing or walking and spends most of his time in a wheelchair.  He is incontinent for bowel and bladder.  The son states that the patient tends to hold his head a lot and wonders if he is having headache.  Patient blood pressure seems well controlled today it is 136/73.  He has not had any follow-up imaging study  done since he left the hospital.  He is also not been tried on medications for dementia as per the son.  ROS:   14 system review of systems is positive for speech difficulty, memory loss, incontinence of bowel and bladder, inability to walk, agitation, headache all other systems negative  PMH:  Past Medical History:  Diagnosis Date   Anemia    Cataract    Esophageal ulcer    Gastritis    proximal   Hiatal hernia    Hyperlipidemia    Intractable hiccups    Reflux esophagitis    Stroke ([place])    r side deficits; September 2017   Upper GI bleed     Social History:  Social History   Socioeconomic History   Marital status: Married    Spouse name: Not on file   Number of children: Not on file   Years of education: Not on file   Highest education level: Not on file  Occupational History   Occupation: retired  Tobacco Use   Smoking status: Never   Smokeless tobacco: Never  Vaping Use   Vaping Use: Never used  Substance and Sexual Activity   Alcohol use: No   Drug use: No   Sexual activity: Not on file  Other Topics Concern   Not on file  Social History Narrative   Married 2 sons (at least)   Spends time with them in [place] and [place]   Social Determinants of Health   Financial Resource Strain: Not on file  Food Insecurity: Not on file  Transportation Needs: Not on file  Physical  Activity: Not on file  Stress: Not on file  Social Connections: Not on file  Intimate Partner Violence: Not on file    Medications:   Current Outpatient Medications on File Prior to Visit  Medication Sig Dispense Refill   acetaminophen (TYLENOL) 500 MG tablet Take 1,000 mg by mouth every 6 (six) hours as needed for mild pain.     atorvastatin (LIPITOR) 20 MG tablet Take 20 mg by mouth daily.     feeding supplement (ENSURE ENLIVE / ENSURE PLUS) LIQD Take 237 mLs by mouth 3 (three) times daily between meals. 237 mL 12   ferrous sulfate 300 (60 Fe) MG/5ML syrup Take 5 mLs (300 mg total) by mouth  daily. 150 mL 11   hydrALAZINE (APRESOLINE) 25 MG tablet Take 1 tablet (25 mg total) by mouth every 8 (eight) hours.     lactulose (CHRONULAC) 10 GM/15ML solution Take 10 g by mouth daily as needed for moderate constipation.     pantoprazole (PROTONIX) 40 MG tablet Take 1 tablet (40 mg total) 2 (two) times daily by mouth. (Patient taking differently: Take 40 mg by mouth daily.) 60 tablet 1   amLODipine (NORVASC) 10 MG tablet Take 1 tablet (10 mg total) by mouth daily. 30 tablet 0   carvedilol (COREG) 25 MG tablet Take 1 tablet (25 mg total) by mouth 2 (two) times daily. 60 tablet 0   No current facility-administered medications on file prior to visit.    Allergies:  No Known Allergies  Physical Exam General: Frail malnourished looking elderly [place] Asian male, seated, in no evident distress Head: head normocephalic and atraumatic.   Neck: supple with no carotid or supraclavicular bruits Cardiovascular: regular rate and rhythm, no murmurs Musculoskeletal: no deformity Skin:  no rash/petichiae Vascular:  Normal pulses all extremities  Neurologic Exam Mental Status: Awake and fully alert. Oriented to place and time. Recent and remote memory poor. Attention span, concentration and fund of knowledge diminished mood and affect appropriate.  Severe dysarthria but can be understood with some difficulty. Cranial Nerves: Fundoscopic exam reveals sharp disc margins. Pupils equal, briskly reactive to light. Extraocular movements full without nystagmus. Visual fields full to confrontation via threat bilaterally.Marland Kitchen Hearing intact. Facial sensation intact.  Moderate right lower facial weakness., tongue, palate moves normally and symmetrically.  Motor: Mild spastic right hemiparesis with 3/5 right upper extremity and 4/5 right lower extremity strength with increased tone.  Normal strength on the left side.  Not. Sensory.: intact to touch , pinprick , position and vibratory sensation.  Coordination:  Operative for testing . Gait and Station: Deferred as patient is unable to walk at baseline and is in a wheelchair.   Reflexes: 2+ and asymmetric and brisker on the right. Toes downgoing.    Modified Rankin  4   ASSESSMENT: 87 year old [place] male with left basal ganglia hemorrhage in September 2022 with significant vascular dementia.  Vascular risk factors of hypertension, hyperlipidemia, prior stroke.     PLAN:I had a long discussion the patient and his son regarding the recent intracerebral hemorrhage and dementia which clearly appears to have worsened and patient is now practically wheelchair-bound and not participating in therapies with very poor quality of life.  Family wishes to take him home and he may be appropriate for home hospice.  Recommend check dementia panel labs, EEG and repeat CT scan of the head to look for reversible etiologies.  Trial of Namenda to look for cognitive improvement.  Continue strict control of hypertension blood  pressure goal below 140/90.  I have advised the patient's son to discussed with nursing home medical director home hospice.  Return for follow-up in the future only as necessary.  Greater than 50% time during this 50-minute consultation visit was spent on counseling and coordination of care about his dementia and intracerebral hemorrhage and discussion about home hospice and answering questions. Antony Contras, MD  Note: This document was prepared with digital dictation and possible smart phrase technology. Any transcriptional errors that result from this process are unintentional.

## 2021-07-09 ENCOUNTER — Other Ambulatory Visit: Payer: Self-pay | Admitting: *Deleted

## 2021-07-09 ENCOUNTER — Telehealth: Payer: Self-pay | Admitting: Neurology

## 2021-07-09 MED ORDER — MEMANTINE HCL 10 MG PO TABS: 10.0000 mg | ORAL_TABLET | Freq: Two times a day (BID) | ORAL | 11 refills | Status: AC

## 2021-07-09 MED ORDER — MEMANTINE HCL 5 MG PO TABS: ORAL_TABLET | ORAL | 0 refills | Status: AC

## 2021-07-09 NOTE — Telephone Encounter (Signed)
medicaid order sent to [facility], [facility] they will reach out to the patient to schedule.

## 2021-07-10 LAB — DEMENTIA PANEL
Homocysteine: 19.4 umol/L (ref 0.0–21.3)
RPR Ser Ql: NONREACTIVE
TSH: 6.3 u[IU]/mL — ABNORMAL HIGH (ref 0.450–4.500)
Vitamin B-12: 832 pg/mL (ref 232–1245)

## 2021-07-13 ENCOUNTER — Telehealth: Payer: Self-pay | Admitting: Neurology

## 2021-07-13 NOTE — Telephone Encounter (Signed)
laboratory evaluation showed elevated TSH, indicating hypothyroidism  I have forwarded the lab to his primary care Jilda Panda, MD he may contact him for further evaluation  Rest of the laboratory evaluation showed no significant abnormalities.

## 2021-07-14 ENCOUNTER — Other Ambulatory Visit: Payer: Self-pay

## 2021-07-14 ENCOUNTER — Ambulatory Visit (INDEPENDENT_AMBULATORY_CARE_PROVIDER_SITE_OTHER): Payer: Medicare Other | Admitting: Neurology

## 2021-07-14 DIAGNOSIS — R410 Disorientation, unspecified: Secondary | ICD-10-CM

## 2021-07-14 DIAGNOSIS — F01B18 Vascular dementia, moderate, with other behavioral disturbance: Secondary | ICD-10-CM

## 2021-07-14 NOTE — Telephone Encounter (Signed)
Pt verified by name and DOB,  spoke to son , results given per provider, voiced understanding all question answered.

## 2021-07-15 ENCOUNTER — Encounter: Payer: Self-pay | Admitting: Neurology

## 2021-07-16 ENCOUNTER — Telehealth: Payer: Self-pay | Admitting: *Deleted

## 2021-07-16 NOTE — Telephone Encounter (Signed)
Patient would like her EEG results.  She sent the following mychart message:  Hi Doctor how are you I would like to know about EEG results for my dad did you get the results yet please let me know thank you    Manishkumar Gatta

## 2021-07-21 NOTE — Telephone Encounter (Signed)
Responded via mychart

## 2021-07-29 ENCOUNTER — Non-Acute Institutional Stay: Payer: Medicare Other | Admitting: Internal Medicine

## 2021-07-29 ENCOUNTER — Other Ambulatory Visit: Payer: Self-pay

## 2021-07-29 ENCOUNTER — Encounter: Payer: Self-pay | Admitting: Internal Medicine

## 2021-07-29 VITALS — BP 110/68 | HR 70

## 2021-07-29 DIAGNOSIS — Z515 Encounter for palliative care: Secondary | ICD-10-CM

## 2021-07-29 DIAGNOSIS — B0089 Other herpesviral infection: Secondary | ICD-10-CM

## 2021-07-29 DIAGNOSIS — I619 Nontraumatic intracerebral hemorrhage, unspecified: Secondary | ICD-10-CM

## 2021-07-29 DIAGNOSIS — R634 Abnormal weight loss: Secondary | ICD-10-CM

## 2021-07-29 DIAGNOSIS — K208 Other esophagitis without bleeding: Secondary | ICD-10-CM

## 2021-07-29 NOTE — Progress Notes (Signed)
Therapist, nutritional Palliative Care Consult Note Telephone: [phone]  Fax: [phone]   Date of encounter: 07/29/21 4:23 PM PATIENT NAME: Logan [name] [address] [place] [address]   [phone] (home)  DOB: 03/23/1935 MRN: [id number] PRIMARY CARE PROVIDER:    Ralene Ok, MD,  [address] [place] [address] [phone]  REFERRING PROVIDER:   Ralene Ok, MD [address] [place],  [place] [address] [phone]  RESPONSIBLE PARTY:    Contact Information     Name Relation Home Work Mobile   [place] Son [phone]  [phone]   Logan [name] Daughter   [phone]   Logan [name]   [phone]   Logan [name] Granddaughter   [phone]        I met face to face with patient and family in facility. Palliative Care was asked to follow this patient by consultation request of  Logan Ok, MD to address advance care planning and complex medical decision making. This is the initial visit.                                     ASSESSMENT AND PLAN / RECOMMENDATIONS:   Advance Care Planning/Goals of Care: Goals include to maximize quality of life and symptom management. Patient/health care surrogate gave his/her permission to discuss.Our advance care planning conversation included a discussion about:    The value and importance of advance care planning  Experiences with loved ones who have been seriously ill or have died  Exploration of personal, cultural or spiritual beliefs that might influence medical decisions  Exploration of goals of care in the event of a sudden injury or illness  Identification  of a healthcare agent  Review and updating or creation of an  advance directive document . Son and DIL want to bring him home.  They were requesting 24x7 help.  Equipment, type of care needed discussed and Logan [name] social work contacted to see what PCS services he might benefit from.  She is going to reach out to  them Decision not to resuscitate or to de-escalate disease focused treatments due to poor prognosis. CODE STATUS:  FULL CODE--family still requests this.  They are open to discussing that further.  Educated about signs of decline that would suggest need for hospice care.  Explained interdisciplinary team.  They would like to have a follow-up visit.  Palliative care number provided. Goals are to be safe and comfortable.  Follow up Palliative Care Visit: Palliative care will continue to follow for complex medical decision making, advance care planning, and clarification of goals. Return 2 weeks or prn.   1. Abnormal weight loss -continue ensure tid and encouraging bites of foods he enjoys that his wife is bringing for him as tolerated  2. Nontraumatic thalamic hemorrhage (HCC) -patient has had strokes affecting both sides--mobility is challenging and he declines much participation in therapy; however, family is not yet ready for hospice as above--will continue to monitor his progress and continue conversations-for now receiving therapy to the extent he will participate  3. Herpes simplex esophagitis -cause of gerd/nausea/odynophagia he's had per chart, cont PPI and zofran  4. Palliative care by specialist -ACP conducted as above  This visit was coded based on medical decision making (MDM).  26 mins spent on ACP.  PPS: 30%  HOSPICE ELIGIBILITY/DIAGNOSIS: cerebrovascular disease with recurrent strokes/if goals become consistent  Chief Complaint: new consult palliative care  HISTORY  OF PRESENT ILLNESS:  Logan [name] is a 87 y.o. year old male  with  multiple strokes--he has hemiplegia of both sides.  He has dysphagia and expressive aphasia.  Has CKD IV, iron deficiency and anemia of chronic disease, diastolic chf, hyperlipidemia, hypothyroidism (new in nov), and dementia.  In sept, he had a basal ganglia hemorrhage and was admitted here since.  Only getting restorative PT now.    He did  follow some commands.  Can speak a few words repetitively but most speech unintelligible.  He feeds himself with his left hand a regular diet with thin liquids.  He is total care for all personal needs otherwise.  He's one person assist for transfers.  He is incontinent of bowel and bladder.  He prefers to stay in bed and resists getting into chair.  Sleeps much of day.  He eats only 25% of his food.  His ensure was just increased to tid due to weight loss of 153 to 144 lbs.  Did gain a lb in the last month since that change.  Does not have any skin breakdown.  Has some ongoing nausea and gerd--uses ondansetron.  Just started on protonix $RemoveBef'20mg'VtrQUfSWmJ$  po bid.    History obtained from review of EMR, discussion with primary team, and interview with family, facility staff/caregiver and/or Logan [name].  I reviewed available labs, medications, imaging, studies and related documents from the EMR.  Records reviewed and summarized above.   ROS  General: NAD EYES: denies vision changes ENMT: has dysphagia Cardiovascular: denies chest pain, denies DOE Pulmonary: denies cough, denies increased SOB Abdomen: endorses poor appetite, denies constipation, endorses incontinence of bowel GU: denies dysuria, endorses incontinence of urine MSK:  has increased weakness,  no falls reported Skin: denies rashes or wounds Neurological: denies pain, denies insomnia Psych: Endorses positive mood--smiles a lot then covers himself back with blankets after each part of exam done Heme/lymph/immuno: denies bruises, abnormal bleeding  Physical Exam: Current and past weights:144# Constitutional: NAD General: frail appearing, thin EYES: anicteric sclera, lids intact, no discharge  ENMT: intact hearing, oral mucous membranes moist, dentition intact CV: S1S2, RRR, RLE edema Pulmonary: LCTA, no increased work of breathing, no cough, room air Abdomen: intake 25%, normo-active BS + 4 quadrants, soft and non tender, no ascites GU:  deferred MSK: has sarcopenia, moves all extremities though weak, mostly in bed Skin: warm and dry, no rashes or wounds on visible skin Neuro:  has generalized weakness,  appears to have cognitive impairment Psych: non-anxious affect, could not assess orientation Hem/lymph/immuno: no widespread bruising  CURRENT PROBLEM LIST:  Patient Active Problem List   Diagnosis Date Noted   Nontraumatic thalamic hemorrhage ([place]) 03/13/2021   ICH (intracerebral hemorrhage) ([place]) 03/13/2021   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   Failure to thrive in adult 02/26/2021   Proteinuria 02/12/2021   Intractable abdominal pain 02/11/2021   AKI (acute kidney injury) ([place]) 02/11/2021   Anemia 02/11/2021   CKD (chronic kidney disease), stage III ([place]) 02/11/2021   Benign essential HTN 02/11/2021   Acute on chronic renal insufficiency    Hypoalbuminemia    Acute systolic CHF (congestive heart failure) (HCC)    S/P total right hip arthroplasty 04/30/2020   Abnormal CT scan, esophagus    Acute upper GI bleed 05/06/2017   Hyperkalemia 05/06/2017   Malnutrition of moderate degree 04/13/2017   Gastroesophageal reflux disease with esophagitis    Hyperglycemia 04/11/2017   Malnutrition of mild degree (HCC) 04/11/2017   Intractable vomiting with nausea  Physical deconditioning    Iron deficiency anemia    Upper GI bleed    Esophageal ulcer with bleeding    Cardiomegaly    Chronic diastolic CHF (congestive heart failure) (HCC)    Intractable hiccups    Cerebrovascular accident (CVA) ([place])    SVT (supraventricular tachycardia) ([place])    Hematemesis 03/03/2017   PAST MEDICAL HISTORY:  Active Ambulatory Problems    Diagnosis Date Noted   Hematemesis 03/03/2017   Upper GI bleed    Esophageal ulcer with bleeding    Cardiomegaly    Chronic diastolic CHF (congestive heart failure) (HCC)    Intractable hiccups    Cerebrovascular accident (CVA) ([place])    SVT (supraventricular tachycardia) ([place])     Hyperglycemia 04/11/2017   Malnutrition of mild degree ([place]) 04/11/2017   Intractable vomiting with nausea    Physical deconditioning    Iron deficiency anemia    Gastroesophageal reflux disease with esophagitis    Malnutrition of moderate degree 04/13/2017   Acute upper GI bleed 05/06/2017   Hyperkalemia 05/06/2017   Abnormal CT scan, esophagus    S/P total right hip arthroplasty 04/30/2020   Acute on chronic renal insufficiency    Hypoalbuminemia    Acute systolic CHF (congestive heart failure) (HCC)    Intractable abdominal pain 02/11/2021   AKI (acute kidney injury) ([place]) 02/11/2021   Anemia 02/11/2021   CKD (chronic kidney disease), stage III ([place]) 02/11/2021   Benign essential HTN 02/11/2021   Proteinuria 02/12/2021   Failure to thrive in adult 02/26/2021   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   Nontraumatic thalamic hemorrhage ([place]) 03/13/2021   ICH (intracerebral hemorrhage) ([place]) 03/13/2021   Resolved Ambulatory Problems    Diagnosis Date Noted   Helicobacter pylori gastritis    Acute CHF (congestive heart failure) ([place]) 02/10/2021   CHF (congestive heart failure) ([place]) 02/10/2021   Past Medical History:  Diagnosis Date   Cataract    Esophageal ulcer    Gastritis    Hiatal hernia    Hyperlipidemia    Reflux esophagitis    Stroke ([place])    SOCIAL HX:  Social History   Tobacco Use   Smoking status: Never   Smokeless tobacco: Never  Substance Use Topics   Alcohol use: No   FAMILY HX:  Family History  Problem Relation Age of Onset   Colon cancer Neg Hx    Esophageal cancer Neg Hx    Rectal cancer Neg Hx       ALLERGIES: No Known Allergies   PERTINENT MEDICATIONS:  Outpatient Encounter Medications as of 07/29/2021  Medication Sig   acetaminophen (TYLENOL) 500 MG tablet Take 1,000 mg by mouth every 6 (six) hours as needed for mild pain.   amLODipine (NORVASC) 10 MG tablet Take 1 tablet (10 mg total) by mouth daily.   atorvastatin (LIPITOR) 20 MG tablet  Take 20 mg by mouth daily.   carvedilol (COREG) 25 MG tablet Take 1 tablet (25 mg total) by mouth 2 (two) times daily.   feeding supplement (ENSURE ENLIVE / ENSURE PLUS) LIQD Take 237 mLs by mouth 3 (three) times daily between meals.   ferrous sulfate 300 (60 Fe) MG/5ML syrup Take 5 mLs (300 mg total) by mouth daily.   hydrALAZINE (APRESOLINE) 25 MG tablet Take 1 tablet (25 mg total) by mouth every 8 (eight) hours.   lactulose (CHRONULAC) 10 GM/15ML solution Take 10 g by mouth daily as needed for moderate constipation.   memantine (NAMENDA) 10 MG tablet Take 1  tablet (10 mg total) by mouth 2 (two) times daily.   memantine (NAMENDA) 5 MG tablet Take $RemoveBef'5mg'LmpZnIResQ$  QD x 1 week, then $RemoveBe'5mg'fjxHNaWLq$  BID x 1 week, then $RemoveBe'5mg'VsPkEmfgP$  in AM and $Remo'10mg'DODVP$  in PM x  1 week, then $RemoveBe'10mg'clUuvyHvv$  BID thereafter.   pantoprazole (PROTONIX) 40 MG tablet Take 1 tablet (40 mg total) 2 (two) times daily by mouth. (Patient taking differently: Take 40 mg by mouth daily.)   No facility-administered encounter medications on file as of 07/29/2021.   Thank you for the opportunity to participate in the care of Mr. Denz.  The palliative care team will continue to follow. Please call our office at [phone] if we can be of additional assistance.   Merlin Golden, DO ,   COVID-19 PATIENT SCREENING TOOL Asked and negative response unless otherwise noted:  Have you had symptoms of covid, tested positive or been in contact with someone with symptoms/positive test in the past 5-10 days?

## 2021-07-31 ENCOUNTER — Telehealth: Payer: Self-pay

## 2021-07-31 NOTE — Telephone Encounter (Signed)
(  2:31 pm) Per request of Dr. Mariea Clonts, [facility] SW attempted to contact patient's son to provide support and resources. SW left a message for him requesting a return call.

## 2021-08-04 ENCOUNTER — Other Ambulatory Visit: Payer: 59

## 2021-08-05 ENCOUNTER — Other Ambulatory Visit: Payer: Medicaid Other

## 2021-08-05 ENCOUNTER — Ambulatory Visit
Admission: RE | Admit: 2021-08-05 | Discharge: 2021-08-05 | Disposition: A | Discharge status: Home / Self Care | Payer: Medicaid Other | Source: Ambulatory Visit | Attending: Neurology | Admitting: Neurology

## 2021-08-05 DIAGNOSIS — I610 Nontraumatic intracerebral hemorrhage in hemisphere, subcortical: Secondary | ICD-10-CM | POA: Diagnosis not present

## 2021-08-10 NOTE — Progress Notes (Signed)
Please call and inform patient the previously seen left basal ganglia hemorrhage is resolved. There is moderate atrophy and small vessel disease.   Dr. April Manson

## 2021-08-11 ENCOUNTER — Encounter: Payer: Self-pay | Admitting: *Deleted

## 2021-08-12 ENCOUNTER — Encounter: Payer: Self-pay | Admitting: Neurology

## 2021-08-13 ENCOUNTER — Telehealth: Payer: Self-pay | Admitting: *Deleted

## 2021-08-13 NOTE — Telephone Encounter (Signed)
I spoke to the patient's son. The results below were reviewed with him. No further questions.

## 2021-08-13 NOTE — Telephone Encounter (Signed)
Patient's son sent mychart message requesting a call to discuss results.  Left message for a return call.  Please call and inform patient the previously seen left basal ganglia hemorrhage is resolved. There is moderate atrophy and small vessel disease.  Dr. April Manson

## 2021-08-13 NOTE — Telephone Encounter (Signed)
Phone note in [place].

## 2021-12-26 DEATH — deceased
